# Patient Record
Sex: Male | Born: 1935 | Race: Black or African American | Hispanic: No | Marital: Married | Smoking: Former smoker
Health system: Southern US, Community
[De-identification: ages and names within clinical notes are randomized; demographics above are authoritative.]

## PROBLEM LIST (undated history)

## (undated) DIAGNOSIS — J45909 Unspecified asthma, uncomplicated: Secondary | ICD-10-CM

## (undated) DIAGNOSIS — Z86718 Personal history of other venous thrombosis and embolism: Secondary | ICD-10-CM

## (undated) DIAGNOSIS — H9190 Unspecified hearing loss, unspecified ear: Secondary | ICD-10-CM

## (undated) DIAGNOSIS — H269 Unspecified cataract: Secondary | ICD-10-CM

## (undated) DIAGNOSIS — F0390 Unspecified dementia without behavioral disturbance: Secondary | ICD-10-CM

## (undated) DIAGNOSIS — E119 Type 2 diabetes mellitus without complications: Secondary | ICD-10-CM

## (undated) DIAGNOSIS — I10 Essential (primary) hypertension: Secondary | ICD-10-CM

## (undated) HISTORY — PX: KNEE SURGERY: SHX244

## (undated) HISTORY — DX: Personal history of other venous thrombosis and embolism: Z86.718

---

## 2013-06-12 DIAGNOSIS — E119 Type 2 diabetes mellitus without complications: Secondary | ICD-10-CM | POA: Diagnosis not present

## 2013-06-12 DIAGNOSIS — E785 Hyperlipidemia, unspecified: Secondary | ICD-10-CM | POA: Diagnosis not present

## 2013-06-12 DIAGNOSIS — E7800 Pure hypercholesterolemia, unspecified: Secondary | ICD-10-CM | POA: Diagnosis not present

## 2013-06-12 DIAGNOSIS — R5381 Other malaise: Secondary | ICD-10-CM | POA: Diagnosis not present

## 2013-06-12 DIAGNOSIS — N312 Flaccid neuropathic bladder, not elsewhere classified: Secondary | ICD-10-CM | POA: Diagnosis not present

## 2013-06-12 DIAGNOSIS — I10 Essential (primary) hypertension: Secondary | ICD-10-CM | POA: Diagnosis not present

## 2013-06-12 DIAGNOSIS — R109 Unspecified abdominal pain: Secondary | ICD-10-CM | POA: Diagnosis not present

## 2013-06-19 DIAGNOSIS — E119 Type 2 diabetes mellitus without complications: Secondary | ICD-10-CM | POA: Diagnosis not present

## 2013-06-19 DIAGNOSIS — F0390 Unspecified dementia without behavioral disturbance: Secondary | ICD-10-CM | POA: Diagnosis not present

## 2013-06-19 DIAGNOSIS — E7800 Pure hypercholesterolemia, unspecified: Secondary | ICD-10-CM | POA: Diagnosis not present

## 2013-06-19 DIAGNOSIS — R0602 Shortness of breath: Secondary | ICD-10-CM | POA: Diagnosis not present

## 2013-06-19 DIAGNOSIS — I10 Essential (primary) hypertension: Secondary | ICD-10-CM | POA: Diagnosis not present

## 2014-08-28 DIAGNOSIS — E559 Vitamin D deficiency, unspecified: Secondary | ICD-10-CM | POA: Diagnosis not present

## 2014-08-28 DIAGNOSIS — R5383 Other fatigue: Secondary | ICD-10-CM | POA: Diagnosis not present

## 2014-08-28 DIAGNOSIS — F0390 Unspecified dementia without behavioral disturbance: Secondary | ICD-10-CM | POA: Diagnosis not present

## 2014-08-28 DIAGNOSIS — N320 Bladder-neck obstruction: Secondary | ICD-10-CM | POA: Diagnosis not present

## 2014-08-28 DIAGNOSIS — I10 Essential (primary) hypertension: Secondary | ICD-10-CM | POA: Diagnosis not present

## 2014-08-28 DIAGNOSIS — E119 Type 2 diabetes mellitus without complications: Secondary | ICD-10-CM | POA: Diagnosis not present

## 2014-08-28 DIAGNOSIS — R0602 Shortness of breath: Secondary | ICD-10-CM | POA: Diagnosis not present

## 2014-08-28 DIAGNOSIS — E780 Pure hypercholesterolemia: Secondary | ICD-10-CM | POA: Diagnosis not present

## 2014-10-23 DIAGNOSIS — H2589 Other age-related cataract: Secondary | ICD-10-CM | POA: Diagnosis not present

## 2014-10-30 ENCOUNTER — Emergency Department
Admission: EM | Admit: 2014-10-30 | Discharge: 2014-10-30 | Disposition: A | Discharge status: Home / Self Care | Payer: Medicare Other | Attending: Emergency Medicine | Admitting: Emergency Medicine

## 2014-10-30 ENCOUNTER — Inpatient Hospital Stay: Admission: RE | Admit: 2014-10-30 | Payer: Self-pay | Source: Ambulatory Visit

## 2014-10-30 ENCOUNTER — Encounter: Payer: Self-pay | Admitting: Medical Oncology

## 2014-10-30 DIAGNOSIS — Z87891 Personal history of nicotine dependence: Secondary | ICD-10-CM | POA: Insufficient documentation

## 2014-10-30 DIAGNOSIS — I159 Secondary hypertension, unspecified: Secondary | ICD-10-CM | POA: Diagnosis not present

## 2014-10-30 DIAGNOSIS — I10 Essential (primary) hypertension: Secondary | ICD-10-CM | POA: Diagnosis present

## 2014-10-30 DIAGNOSIS — E119 Type 2 diabetes mellitus without complications: Secondary | ICD-10-CM | POA: Insufficient documentation

## 2014-10-30 DIAGNOSIS — H2589 Other age-related cataract: Secondary | ICD-10-CM | POA: Diagnosis not present

## 2014-10-30 HISTORY — DX: Unspecified cataract: H26.9

## 2014-10-30 HISTORY — DX: Unspecified dementia, unspecified severity, without behavioral disturbance, psychotic disturbance, mood disturbance, and anxiety: F03.90

## 2014-10-30 HISTORY — DX: Type 2 diabetes mellitus without complications: E11.9

## 2014-10-30 LAB — BASIC METABOLIC PANEL
ANION GAP: 6 (ref 5–15)
BUN: 11 mg/dL (ref 6–20)
CHLORIDE: 105 mmol/L (ref 101–111)
CO2: 27 mmol/L (ref 22–32)
Calcium: 8.7 mg/dL — ABNORMAL LOW (ref 8.9–10.3)
Creatinine, Ser: 1.11 mg/dL (ref 0.61–1.24)
GFR calc non Af Amer: >60 mL/min (ref >60)
Glucose, Bld: 206 mg/dL — ABNORMAL HIGH (ref 65–99)
Potassium: 5.1 mmol/L (ref 3.5–5.1)
Sodium: 138 mmol/L (ref 135–145)

## 2014-10-30 LAB — CBC
HCT: 44.5 % (ref 40.0–52.0)
Hemoglobin: 14.4 g/dL (ref 13.0–18.0)
MCH: 27.5 pg (ref 26.0–34.0)
MCHC: 32.3 g/dL (ref 32.0–36.0)
MCV: 85.1 fL (ref 80.0–100.0)
PLATELETS: 175 10*3/uL (ref 150–440)
RBC: 5.23 MIL/uL (ref 4.40–5.90)
RDW: 14.2 % (ref 11.5–14.5)
WBC: 4.4 10*3/uL (ref 3.8–10.6)

## 2014-10-30 LAB — TROPONIN I: Troponin I: <0.03 ng/mL (ref <0.031)

## 2014-10-30 MED ORDER — HYDROCHLOROTHIAZIDE 12.5 MG PO TABS: 12.5000 mg | ORAL_TABLET | Freq: Every day | ORAL | Status: DC

## 2014-10-30 NOTE — ED Notes (Signed)
Pt ambulatory to triage with reports that they were at [facility] for pre-op for cataract surgery, pt was noted to be hypertensive and told to come to er. No hx of HTN.

## 2014-10-30 NOTE — Discharge Instructions (Signed)
Please seek medical attention for any high fevers, chest pain, shortness of breath, change in behavior, persistent vomiting, bloody stool or any other new or concerning symptoms. ° °Hypertension °Hypertension, commonly called high blood pressure, is when the force of blood pumping through your arteries is too strong. Your arteries are the blood vessels that carry blood from your heart throughout your body. A blood pressure reading consists of a higher number over a lower number, such as 110/72. The higher number (systolic) is the pressure inside your arteries when your heart pumps. The lower number (diastolic) is the pressure inside your arteries when your heart relaxes. Ideally you want your blood pressure below 120/80. °Hypertension forces your heart to work harder to pump blood. Your arteries may become narrow or stiff. Having hypertension puts you at risk for heart disease, stroke, and other problems.  °RISK FACTORS °Some risk factors for high blood pressure are controllable. Others are not.  °Risk factors you cannot control include:  °· Race. You may be at higher risk if you are African American. °· Age. Risk increases with age. °· Gender. Men are at higher risk than women before age 45 years. After age 65, women are at higher risk than men. °Risk factors you can control include: °· Not getting enough exercise or physical activity. °· Being overweight. °· Getting too much fat, sugar, calories, or salt in your diet. °· Drinking too much alcohol. °SIGNS AND SYMPTOMS °Hypertension does not usually cause signs or symptoms. Extremely high blood pressure (hypertensive crisis) may cause headache, anxiety, shortness of breath, and nosebleed. °DIAGNOSIS  °To check if you have hypertension, your health care provider will measure your blood pressure while you are seated, with your arm held at the level of your heart. It should be measured at least twice using the same arm. Certain conditions can cause a difference in  blood pressure between your right and left arms. A blood pressure reading that is higher than normal on one occasion does not mean that you need treatment. If one blood pressure reading is high, ask your health care provider about having it checked again. °TREATMENT  °Treating high blood pressure includes making lifestyle changes and possibly taking medicine. Living a healthy lifestyle can help lower high blood pressure. You may need to change some of your habits. °Lifestyle changes may include: °· Following the DASH diet. This diet is high in fruits, vegetables, and whole grains. It is low in salt, red meat, and added sugars. °· Getting at least 2½ hours of brisk physical activity every week. °· Losing weight if necessary. °· Not smoking. °· Limiting alcoholic beverages. °· Learning ways to reduce stress. ° If lifestyle changes are not enough to get your blood pressure under control, your health care provider may prescribe medicine. You may need to take more than one. Work closely with your health care provider to understand the risks and benefits. °HOME CARE INSTRUCTIONS °· Have your blood pressure rechecked as directed by your health care provider.   °· Take medicines only as directed by your health care provider. Follow the directions carefully. Blood pressure medicines must be taken as prescribed. The medicine does not work as well when you skip doses. Skipping doses also puts you at risk for problems.   °· Do not smoke.   °· Monitor your blood pressure at home as directed by your health care provider.  °SEEK MEDICAL CARE IF:  °· You think you are having a reaction to medicines taken. °· You have recurrent headaches or feel   dizzy. °· You have swelling in your ankles. °· You have trouble with your vision. °SEEK IMMEDIATE MEDICAL CARE IF: °· You develop a severe headache or confusion. °· You have unusual weakness, numbness, or feel faint. °· You have severe chest or abdominal pain. °· You vomit repeatedly. °· You  have trouble breathing. °MAKE SURE YOU:  °· Understand these instructions. °· Will watch your condition. °· Will get help right away if you are not doing well or get worse. °Document Released: 04/24/2005 Document Revised: 09/08/2013 Document Reviewed: 02/14/2013 °ExitCare® Patient Information ©2015 [employer]. This information is not intended to replace advice given to you by your health care provider. Make sure you discuss any questions you have with your health care provider. ° °

## 2014-10-30 NOTE — ED Notes (Signed)
Per Myrna in lab the patient's troponin is <0.03

## 2014-10-30 NOTE — ED Provider Notes (Signed)
[facility] Emergency Department Provider Note  ____________________________________________  Time seen: 1300  I have reviewed the triage vital signs and the nursing notes.   HISTORY  Chief Complaint Hypertension   History limited by: Not Limited   HPI Tyler Jimenez is a 81 y.o. male who presents to the emergency department because of concerns for high blood pressure. Patient was at the ophthalmologist office today where he was getting preop for cataract surgery when they noted him to have extremely elevated blood pressure. They advised that the patient be evaluated in emergency department. The patient denies any recent symptoms. Denies any chest pain, shortness breath, headache. Patient does state he has a history of high blood pressure and has been on blood pressure medication in the past. He states he has not been taking it for a long time.     Past Medical History  Diagnosis Date  . Diabetes mellitus without complication   . Cataract   . Dementia     There are no active problems to display for this patient.   History reviewed. No pertinent past surgical history.  No current outpatient prescriptions on file.  Allergies Review of patient's allergies indicates no known allergies.  No family history on file.  Social History History  Substance Use Topics  . Smoking status: Former Games developer  . Smokeless tobacco: Not on file  . Alcohol Use: Yes     Comment: occ    Review of Systems  Constitutional: Negative for fever. Cardiovascular: Negative for chest pain. Respiratory: Negative for shortness of breath. Gastrointestinal: Negative for abdominal pain, vomiting and diarrhea. Genitourinary: Negative for dysuria. Musculoskeletal: Negative for back pain. Skin: Negative for rash. Neurological: Negative for headaches, focal weakness or numbness.  10-point ROS otherwise negative.  ____________________________________________   PHYSICAL  EXAM:  VITAL SIGNS: ED Triage Vitals  Enc Vitals Group     BP 10/30/14 1233 229/114 mmHg     Pulse Rate 10/30/14 1233 62     Resp 10/30/14 1233 18     Temp 10/30/14 1233 98.3 F (36.8 C)     Temp Source 10/30/14 1233 Oral     SpO2 10/30/14 1233 97 %     Weight 10/30/14 1233 150 lb (68.04 kg)     Height 10/30/14 1233 5\' 2"  (1.575 m)     Head Cir --      Peak Flow --      Pain Score --      Pain Loc --      Pain Edu? --      Excl. in GC? --      Constitutional: Alert and oriented. Well appearing and in no distress. Eyes: Conjunctivae are normal. PERRL. Normal extraocular movements. ENT   Head: Normocephalic and atraumatic.   Nose: No congestion/rhinnorhea.   Mouth/Throat: Mucous membranes are moist.   Neck: No stridor. Hematological/Lymphatic/Immunilogical: No cervical lymphadenopathy. Cardiovascular: Normal rate, regular rhythm.  No murmurs, rubs, or gallops. Respiratory: Normal respiratory effort without tachypnea nor retractions. Breath sounds are clear and equal bilaterally. No wheezes/rales/rhonchi. Gastrointestinal: Soft and nontender. No distention. There is no CVA tenderness. Genitourinary: Deferred Musculoskeletal: Normal range of motion in all extremities. No joint effusions.  No lower extremity tenderness nor edema. Neurologic:  Normal speech and language. No gross focal neurologic deficits are appreciated. Speech is normal.  Skin:  Skin is warm, dry and intact. No rash noted. Psychiatric: Mood and affect are normal. Speech and behavior are normal. Patient exhibits appropriate insight and judgment.  ____________________________________________    LABS (pertinent positives/negatives)  Labs Reviewed  BASIC METABOLIC PANEL - Abnormal; Notable for the following:    Glucose, Bld 206 (*)    Calcium 8.7 (*)    All other components within normal limits  CBC  TROPONIN I     ____________________________________________   EKG  I, Phineas Semen,  attending physician, personally viewed and interpreted this EKG  EKG Time: 1242 Rate: 61 Rhythm: Normal sinus rhythm Axis: Left axis deviation Intervals: QTC 390 QRS: Narrow, Q waves in 3, aVF ST changes: No ST elevation    ____________________________________________    RADIOLOGY  None  ____________________________________________   PROCEDURES  Procedure(s) performed: None  Critical Care performed: No  ____________________________________________   INITIAL IMPRESSION / ASSESSMENT AND PLAN / ED COURSE  Pertinent labs & imaging results that were available during my care of the patient were reviewed by me and considered in my medical decision making (see chart for details).  Patient here with asymptomatic hypertension. Will check blood work. If blood work is okay will start on antihypertensive.  ____________________________________________   FINAL CLINICAL IMPRESSION(S) / ED DIAGNOSES  Final diagnoses:  Secondary hypertension, unspecified     Phineas Semen, MD 11/01/14 1502

## 2014-11-05 DIAGNOSIS — E1165 Type 2 diabetes mellitus with hyperglycemia: Secondary | ICD-10-CM | POA: Diagnosis not present

## 2014-11-05 DIAGNOSIS — E1122 Type 2 diabetes mellitus with diabetic chronic kidney disease: Secondary | ICD-10-CM | POA: Diagnosis not present

## 2014-11-05 DIAGNOSIS — I10 Essential (primary) hypertension: Secondary | ICD-10-CM | POA: Diagnosis not present

## 2014-11-05 DIAGNOSIS — G309 Alzheimer's disease, unspecified: Secondary | ICD-10-CM | POA: Diagnosis not present

## 2014-11-05 DIAGNOSIS — R011 Cardiac murmur, unspecified: Secondary | ICD-10-CM | POA: Diagnosis not present

## 2014-11-05 DIAGNOSIS — E785 Hyperlipidemia, unspecified: Secondary | ICD-10-CM | POA: Diagnosis not present

## 2014-11-05 DIAGNOSIS — Z125 Encounter for screening for malignant neoplasm of prostate: Secondary | ICD-10-CM | POA: Diagnosis not present

## 2014-11-10 ENCOUNTER — Ambulatory Visit: Admission: RE | Admit: 2014-11-10 | Payer: Medicare Other | Source: Ambulatory Visit | Admitting: Ophthalmology

## 2014-11-10 SURGERY — PHACOEMULSIFICATION, CATARACT, WITH IOL INSERTION
Anesthesia: Choice | Laterality: Right

## 2014-11-12 DIAGNOSIS — R011 Cardiac murmur, unspecified: Secondary | ICD-10-CM | POA: Diagnosis not present

## 2014-11-16 DIAGNOSIS — G309 Alzheimer's disease, unspecified: Secondary | ICD-10-CM | POA: Diagnosis not present

## 2014-11-16 DIAGNOSIS — I10 Essential (primary) hypertension: Secondary | ICD-10-CM | POA: Diagnosis not present

## 2014-11-16 DIAGNOSIS — E1165 Type 2 diabetes mellitus with hyperglycemia: Secondary | ICD-10-CM | POA: Diagnosis not present

## 2014-12-04 ENCOUNTER — Encounter: Payer: Self-pay | Admitting: Urology

## 2014-12-04 ENCOUNTER — Ambulatory Visit: Payer: Self-pay

## 2014-12-08 DIAGNOSIS — G309 Alzheimer's disease, unspecified: Secondary | ICD-10-CM | POA: Diagnosis not present

## 2014-12-08 DIAGNOSIS — I10 Essential (primary) hypertension: Secondary | ICD-10-CM | POA: Diagnosis not present

## 2014-12-08 DIAGNOSIS — E1165 Type 2 diabetes mellitus with hyperglycemia: Secondary | ICD-10-CM | POA: Diagnosis not present

## 2015-01-04 ENCOUNTER — Encounter: Payer: Self-pay | Admitting: *Deleted

## 2015-01-04 DIAGNOSIS — Z87891 Personal history of nicotine dependence: Secondary | ICD-10-CM | POA: Diagnosis not present

## 2015-01-04 DIAGNOSIS — F0390 Unspecified dementia without behavioral disturbance: Secondary | ICD-10-CM | POA: Diagnosis not present

## 2015-01-04 DIAGNOSIS — J45909 Unspecified asthma, uncomplicated: Secondary | ICD-10-CM | POA: Diagnosis not present

## 2015-01-04 DIAGNOSIS — H2511 Age-related nuclear cataract, right eye: Secondary | ICD-10-CM | POA: Diagnosis not present

## 2015-01-04 DIAGNOSIS — Z79899 Other long term (current) drug therapy: Secondary | ICD-10-CM | POA: Diagnosis not present

## 2015-01-04 DIAGNOSIS — I10 Essential (primary) hypertension: Secondary | ICD-10-CM | POA: Diagnosis not present

## 2015-01-04 DIAGNOSIS — E119 Type 2 diabetes mellitus without complications: Secondary | ICD-10-CM | POA: Diagnosis not present

## 2015-01-04 NOTE — OR Nursing (Signed)
Cleared by Sander [facility]on medical

## 2015-01-05 ENCOUNTER — Ambulatory Visit: Payer: Medicare Other | Admitting: Anesthesiology

## 2015-01-05 ENCOUNTER — Encounter
Admission: RE | Disposition: A | Discharge status: Home / Self Care | Payer: Self-pay | Source: Ambulatory Visit | Attending: Ophthalmology

## 2015-01-05 ENCOUNTER — Ambulatory Visit
Admission: RE | Admit: 2015-01-05 | Discharge: 2015-01-05 | Disposition: A | Discharge status: Home / Self Care | Payer: Medicare Other | Source: Ambulatory Visit | Attending: Ophthalmology | Admitting: Ophthalmology

## 2015-01-05 ENCOUNTER — Encounter: Payer: Self-pay | Admitting: *Deleted

## 2015-01-05 DIAGNOSIS — Z87891 Personal history of nicotine dependence: Secondary | ICD-10-CM | POA: Diagnosis not present

## 2015-01-05 DIAGNOSIS — F0390 Unspecified dementia without behavioral disturbance: Secondary | ICD-10-CM | POA: Insufficient documentation

## 2015-01-05 DIAGNOSIS — H2589 Other age-related cataract: Secondary | ICD-10-CM | POA: Diagnosis not present

## 2015-01-05 DIAGNOSIS — E119 Type 2 diabetes mellitus without complications: Secondary | ICD-10-CM | POA: Insufficient documentation

## 2015-01-05 DIAGNOSIS — H2511 Age-related nuclear cataract, right eye: Secondary | ICD-10-CM | POA: Diagnosis not present

## 2015-01-05 DIAGNOSIS — Z79899 Other long term (current) drug therapy: Secondary | ICD-10-CM | POA: Insufficient documentation

## 2015-01-05 DIAGNOSIS — J45909 Unspecified asthma, uncomplicated: Secondary | ICD-10-CM | POA: Diagnosis not present

## 2015-01-05 DIAGNOSIS — I10 Essential (primary) hypertension: Secondary | ICD-10-CM | POA: Insufficient documentation

## 2015-01-05 HISTORY — DX: Essential (primary) hypertension: I10

## 2015-01-05 HISTORY — DX: Unspecified asthma, uncomplicated: J45.909

## 2015-01-05 HISTORY — PX: CATARACT EXTRACTION W/PHACO: SHX586

## 2015-01-05 HISTORY — DX: Unspecified hearing loss, unspecified ear: H91.90

## 2015-01-05 LAB — GLUCOSE, CAPILLARY: GLUCOSE-CAPILLARY: 109 mg/dL — AB (ref 65–99)

## 2015-01-05 SURGERY — PHACOEMULSIFICATION, CATARACT, WITH IOL INSERTION
Anesthesia: Monitor Anesthesia Care | Site: Eye | Laterality: Right | Wound class: Clean

## 2015-01-05 MED ORDER — MOXIFLOXACIN HCL 0.5 % OP SOLN: 1.0000 [drp] | OPHTHALMIC | Status: DC | PRN

## 2015-01-05 MED ORDER — POVIDONE-IODINE 5 % OP SOLN
1.0000 "application " | OPHTHALMIC | Status: AC | PRN
  Administered 2015-01-05: 1 via OPHTHALMIC

## 2015-01-05 MED ORDER — MOXIFLOXACIN HCL 0.5 % OP SOLN
OPHTHALMIC | Status: DC | PRN
  Administered 2015-01-05: 1 [drp] via OPHTHALMIC

## 2015-01-05 MED ORDER — CEFUROXIME OPHTHALMIC INJECTION 1 MG/0.1 ML
INJECTION | OPHTHALMIC | Status: AC
  Filled 2015-01-05: qty 0.1

## 2015-01-05 MED ORDER — TETRACAINE HCL 0.5 % OP SOLN
1.0000 [drp] | OPHTHALMIC | Status: AC | PRN
  Administered 2015-01-05: 1 [drp] via OPHTHALMIC

## 2015-01-05 MED ORDER — MOXIFLOXACIN HCL 0.5 % OP SOLN
OPHTHALMIC | Status: AC
  Filled 2015-01-05: qty 3

## 2015-01-05 MED ORDER — NA CHONDROIT SULF-NA HYALURON 40-17 MG/ML IO SOLN
INTRAOCULAR | Status: AC
  Filled 2015-01-05: qty 1

## 2015-01-05 MED ORDER — SODIUM CHLORIDE 0.9 % IV SOLN
INTRAVENOUS | Status: DC
  Administered 2015-01-05: 50 mL/h via INTRAVENOUS

## 2015-01-05 MED ORDER — EPINEPHRINE HCL 1 MG/ML IJ SOLN
INTRAOCULAR | Status: DC | PRN
  Administered 2015-01-05: 200 mL via OPHTHALMIC

## 2015-01-05 MED ORDER — FENTANYL CITRATE (PF) 100 MCG/2ML IJ SOLN
INTRAMUSCULAR | Status: DC | PRN
  Administered 2015-01-05: 50 ug via INTRAVENOUS

## 2015-01-05 MED ORDER — ARMC OPHTHALMIC DILATING GEL
OPHTHALMIC | Status: AC
  Administered 2015-01-05: 1 via OPHTHALMIC
  Filled 2015-01-05: qty 0.25

## 2015-01-05 MED ORDER — EPINEPHRINE HCL 1 MG/ML IJ SOLN
INTRAMUSCULAR | Status: AC
  Filled 2015-01-05: qty 1

## 2015-01-05 MED ORDER — TETRACAINE HCL 0.5 % OP SOLN
OPHTHALMIC | Status: AC
  Administered 2015-01-05: 1 [drp] via OPHTHALMIC
  Filled 2015-01-05: qty 2

## 2015-01-05 MED ORDER — CEFUROXIME OPHTHALMIC INJECTION 1 MG/0.1 ML
INJECTION | OPHTHALMIC | Status: DC | PRN
  Administered 2015-01-05: 0.1 mL via INTRACAMERAL

## 2015-01-05 MED ORDER — NA CHONDROIT SULF-NA HYALURON 40-17 MG/ML IO SOLN
INTRAOCULAR | Status: DC | PRN
  Administered 2015-01-05: 1 mL via INTRAOCULAR

## 2015-01-05 MED ORDER — ARMC OPHTHALMIC DILATING GEL
1.0000 "application " | OPHTHALMIC | Status: DC | PRN
  Administered 2015-01-05: 1 via OPHTHALMIC

## 2015-01-05 MED ORDER — POVIDONE-IODINE 5 % OP SOLN
OPHTHALMIC | Status: AC
  Administered 2015-01-05: 1 via OPHTHALMIC
  Filled 2015-01-05: qty 30

## 2015-01-05 SURGICAL SUPPLY — 23 items
CANNULA ANT/CHMB 27GA (MISCELLANEOUS) ×3 IMPLANT
CUP MEDICINE 2OZ PLAST GRAD ST (MISCELLANEOUS) ×3 IMPLANT
GLOVE BIO SURGEON STRL SZ8 (GLOVE) ×3 IMPLANT
GLOVE BIOGEL M 6.5 STRL (GLOVE) ×3 IMPLANT
GLOVE SURG LX 8.0 MICRO (GLOVE) ×2
GLOVE SURG LX STRL 8.0 MICRO (GLOVE) ×1 IMPLANT
GOWN STRL REUS W/ TWL LRG LVL3 (GOWN DISPOSABLE) ×2 IMPLANT
GOWN STRL REUS W/TWL LRG LVL3 (GOWN DISPOSABLE) ×4
LENS IOL TECNIS 21 (Intraocular Lens) ×1 IMPLANT
LENS IOL TECNIS 21.0 (Intraocular Lens) ×2 IMPLANT
LENS IOL TECNIS MONO 1P 21.0 (Intraocular Lens) ×1 IMPLANT
PACK CATARACT (MISCELLANEOUS) ×3 IMPLANT
PACK CATARACT BRASINGTON LX (MISCELLANEOUS) ×3 IMPLANT
PACK EYE AFTER SURG (MISCELLANEOUS) ×3 IMPLANT
SOL BSS BAG (MISCELLANEOUS) ×3
SOL PREP PVP 2OZ (MISCELLANEOUS) ×3
SOLUTION BSS BAG (MISCELLANEOUS) ×1 IMPLANT
SOLUTION PREP PVP 2OZ (MISCELLANEOUS) ×1 IMPLANT
SYR 3ML LL SCALE MARK (SYRINGE) ×3 IMPLANT
SYR 5ML LL (SYRINGE) ×3 IMPLANT
SYR TB 1ML 27GX1/2 LL (SYRINGE) ×3 IMPLANT
WATER STERILE IRR 1000ML POUR (IV SOLUTION) ×3 IMPLANT
WIPE NON LINTING 3.25X3.25 (MISCELLANEOUS) ×3 IMPLANT

## 2015-01-05 NOTE — Anesthesia Postprocedure Evaluation (Signed)
  Anesthesia Post-op Note  Patient: Tyler Jimenez  Procedure(s) Performed: Procedure(s) with comments: CATARACT EXTRACTION PHACO AND INTRAOCULAR LENS PLACEMENT (IOC) (Right) - [place]: 02:33.9 AP%: 29.9 CDE: 46.04 Lot# [id number]  Anesthesia type:MAC  Patient location: short stay  Post pain: Pain level controlled  Post assessment: Post-op Vital signs reviewed, Patient's Cardiovascular Status Stable, Respiratory Function Stable, Patent Airway and No signs of Nausea or vomiting  Post vital signs: Reviewed and stable  Last Vitals:  Filed Vitals:   01/05/15 0644  BP: 171/76  Pulse: 67  Temp: 36.7 C  Resp: 16    Level of consciousness: awake, alert  and patient cooperative  Complications: No apparent anesthesia complications

## 2015-01-05 NOTE — Transfer of Care (Signed)
Immediate Anesthesia Transfer of Care Note  Patient: Tyler Jimenez  Procedure(s) Performed: Procedure(s) with comments: CATARACT EXTRACTION PHACO AND INTRAOCULAR LENS PLACEMENT (IOC) (Right) - [place]: 02:33.9 AP%: 29.9 CDE: 46.04 Lot# [id number]  Patient Location: PACU and Short Stay  Anesthesia Type:MAC  Level of Consciousness: awake, alert  and oriented  Airway & Oxygen Therapy: Patient Spontanous Breathing and Patient connected to nasal cannula oxygen  Post-op Assessment: Report given to RN and Post -op Vital signs reviewed and stable  Post vital signs: Reviewed and stable  Last Vitals: 100% 96.6 70hr 168/91 18r Filed Vitals:   01/05/15 0644  BP: 171/76  Pulse: 67  Temp: 36.7 C  Resp: 16    Complications: No apparent anesthesia complications

## 2015-01-05 NOTE — H&P (Signed)
  All labs reviewed. Abnormal studies sent to patients PCP when indicated.  Previous H&P reviewed, patient examined, there are NO CHANGES.  Tyler Jimenez LOUIS8/30/20167:47 AM

## 2015-01-05 NOTE — Anesthesia Preprocedure Evaluation (Signed)
Anesthesia Evaluation  Patient identified by MRN, date of birth, ID band Patient awake    Reviewed: Allergy & Precautions, NPO status , Patient's Chart, lab work & pertinent test results  Airway Mallampati: II  TM Distance: >3 FB Neck ROM: Full    Dental  (+) Poor Dentition   Pulmonary asthma , former smoker,    Pulmonary exam normal       Cardiovascular hypertension, Normal cardiovascular exam    Neuro/Psych PSYCHIATRIC DISORDERS dementia   GI/Hepatic negative GI ROS, Neg liver ROS,   Endo/Other  diabetes, Well Controlled, Type 2  Renal/GU negative Renal ROS  negative genitourinary   Musculoskeletal negative musculoskeletal ROS (+)   Abdominal Normal abdominal exam  (+)   Peds negative pediatric ROS (+)  Hematology negative hematology ROS (+)   Anesthesia Other Findings   Reproductive/Obstetrics                             Anesthesia Physical Anesthesia Plan  ASA: III  Anesthesia Plan: MAC   Post-op Pain Management:    Induction: Intravenous  Airway Management Planned: Nasal Cannula  Additional Equipment:   Intra-op Plan:   Post-operative Plan:   Informed Consent: I have reviewed the patients History and Physical, chart, labs and discussed the procedure including the risks, benefits and alternatives for the proposed anesthesia with the patient or authorized representative who has indicated his/her understanding and acceptance.   Dental advisory given  Plan Discussed with: CRNA and Surgeon  Anesthesia Plan Comments:         Anesthesia Quick Evaluation

## 2015-01-05 NOTE — Discharge Instructions (Signed)
AMBULATORY SURGERY  DISCHARGE INSTRUCTIONS   1) The drugs that you were given will stay in your system until tomorrow so for the next 24 hours you should not:  A) Drive an automobile B) Make any legal decisions C) Drink any alcoholic beverage   2) You may resume regular meals tomorrow.  Today it is better to start with liquids and gradually work up to solid foods.  You may eat anything you prefer, but it is better to start with liquids, then soup and crackers, and gradually work up to solid foods.   3) Please notify your doctor immediately if you have any unusual bleeding, trouble breathing, redness and pain at the surgery site, drainage, fever, or pain not relieved by medication.    4) Additional Instructions:     Eye Surgery Discharge Instructions  Expect mild scratchy sensation or mild soreness. DO NOT RUB YOUR EYE!  The day of surgery:  Minimal physical activity, but bed rest is not required  No reading, computer work, or close hand work  No bending, lifting, or straining.  May watch TV  For 24 hours:  No driving, legal decisions, or alcoholic beverages  Safety precautions  Eat anything you prefer: It is better to start with liquids, then soup then solid foods.  _____ Eye patch should be worn until postoperative exam tomorrow.  ____ Solar shield eyeglasses should be worn for comfort in the sunlight/patch while sleeping  Resume all regular medications including aspirin or Coumadin if these were discontinued prior to surgery. You may shower, bathe, shave, or wash your hair. Tylenol may be taken for mild discomfort.  Call your doctor if you experience significant pain, nausea, or vomiting, fever > 101 or other signs of infection. [phone] or [phone] Specific instructions:  Follow-up Information    Please follow up.   Why:  August 31 at 9:40am       Please contact your physician with any problems or Same Day Surgery at [phone], Monday  through Friday 6 am to 4 pm, or [place] [facility] number at [phone].

## 2015-01-05 NOTE — Op Note (Signed)
PREOPERATIVE DIAGNOSIS:  Nuclear sclerotic cataract of the right eye.   POSTOPERATIVE DIAGNOSIS: right nuclear sclerotic cataract   OPERATIVE PROCEDURE:  Procedure(s): CATARACT EXTRACTION PHACO AND INTRAOCULAR LENS PLACEMENT (IOC)   SURGEON:  Galen Manila, MD.   ANESTHESIA:  Anesthesiologist: Yves Dill, MD CRNA: Chong Sicilian, CRNA  1.      Managed anesthesia care. 2.      Topical tetracaine drops followed by 2% Xylocaine jelly applied in the preoperative holding area.   COMPLICATIONS:  None.   TECHNIQUE:   Stop and chop   DESCRIPTION OF PROCEDURE:  The patient was examined and consented in the preoperative holding area where the aforementioned topical anesthesia was applied to the right eye and then brought back to the Operating Room where the right eye was prepped and draped in the usual sterile ophthalmic fashion and a lid speculum was placed. A paracentesis was created with the side port blade and the anterior chamber was filled with viscoelastic. A near clear corneal incision was performed with the steel keratome. A continuous curvilinear capsulorrhexis was performed with a cystotome followed by the capsulorrhexis forceps. Hydrodissection and hydrodelineation were carried out with BSS on a blunt cannula. The lens was removed in a stop and chop  technique and the remaining cortical material was removed with the irrigation-aspiration handpiece. The capsular bag was inflated with viscoelastic and the Technis ZCB00  lens was placed in the capsular bag without complication. The remaining viscoelastic was removed from the eye with the irrigation-aspiration handpiece. The wounds were hydrated. The anterior chamber was flushed with Miostat and the eye was inflated to physiologic pressure. 0.1 mL of cefuroxime concentration 10 mg/mL was placed in the anterior chamber. The wounds were found to be water tight. The eye was dressed with Vigamox. The patient was given protective glasses to wear  throughout the day and a shield with which to sleep tonight. The patient was also given drops with which to begin a drop regimen today and will follow-up with me in one day.  Implant Name Type Inv. Item Serial No. Manufacturer Lot No. LRB No. Used  LENS IMPL INTRAOC [id number] 21.0 - [id number] Intraocular Lens LENS IMPL INTRAOC ZCB00 21.0 [id number]   Right 1   Procedure(s) with comments: CATARACT EXTRACTION PHACO AND INTRAOCULAR LENS PLACEMENT (IOC) (Right) - [place]: 02:33.9 AP%: 29.9 CDE: 46.04 Lot# [id number]  Electronically signed: Akiba Melfi LOUIS 01/05/2015 8:20 AM

## 2015-01-13 DIAGNOSIS — H2589 Other age-related cataract: Secondary | ICD-10-CM | POA: Diagnosis not present

## 2015-01-14 ENCOUNTER — Encounter: Payer: Self-pay | Admitting: *Deleted

## 2015-01-19 ENCOUNTER — Encounter
Admission: RE | Disposition: A | Discharge status: Home / Self Care | Payer: Self-pay | Source: Ambulatory Visit | Attending: Ophthalmology

## 2015-01-19 ENCOUNTER — Ambulatory Visit: Payer: Medicare Other | Admitting: Anesthesiology

## 2015-01-19 ENCOUNTER — Ambulatory Visit
Admission: RE | Admit: 2015-01-19 | Discharge: 2015-01-19 | Disposition: A | Discharge status: Home / Self Care | Payer: Medicare Other | Source: Ambulatory Visit | Attending: Ophthalmology | Admitting: Ophthalmology

## 2015-01-19 ENCOUNTER — Encounter: Payer: Self-pay | Admitting: *Deleted

## 2015-01-19 DIAGNOSIS — J45909 Unspecified asthma, uncomplicated: Secondary | ICD-10-CM | POA: Diagnosis not present

## 2015-01-19 DIAGNOSIS — Z87891 Personal history of nicotine dependence: Secondary | ICD-10-CM | POA: Insufficient documentation

## 2015-01-19 DIAGNOSIS — E119 Type 2 diabetes mellitus without complications: Secondary | ICD-10-CM | POA: Diagnosis not present

## 2015-01-19 DIAGNOSIS — F0390 Unspecified dementia without behavioral disturbance: Secondary | ICD-10-CM | POA: Insufficient documentation

## 2015-01-19 DIAGNOSIS — H9190 Unspecified hearing loss, unspecified ear: Secondary | ICD-10-CM | POA: Insufficient documentation

## 2015-01-19 DIAGNOSIS — H2512 Age-related nuclear cataract, left eye: Secondary | ICD-10-CM | POA: Diagnosis not present

## 2015-01-19 DIAGNOSIS — I10 Essential (primary) hypertension: Secondary | ICD-10-CM | POA: Insufficient documentation

## 2015-01-19 DIAGNOSIS — H2589 Other age-related cataract: Secondary | ICD-10-CM | POA: Diagnosis not present

## 2015-01-19 HISTORY — PX: CATARACT EXTRACTION W/PHACO: SHX586

## 2015-01-19 LAB — GLUCOSE, CAPILLARY: Glucose-Capillary: 126 mg/dL — ABNORMAL HIGH (ref 65–99)

## 2015-01-19 SURGERY — PHACOEMULSIFICATION, CATARACT, WITH IOL INSERTION
Anesthesia: Monitor Anesthesia Care | Site: Eye | Laterality: Left | Wound class: Clean

## 2015-01-19 MED ORDER — EPINEPHRINE HCL 1 MG/ML IJ SOLN
INTRAMUSCULAR | Status: AC
Start: 2015-01-19 — End: 2015-01-19
  Filled 2015-01-19: qty 2

## 2015-01-19 MED ORDER — LIDOCAINE HCL (PF) 4 % IJ SOLN
INTRAOCULAR | Status: DC | PRN
  Administered 2015-01-19: 4 mL via OPHTHALMIC

## 2015-01-19 MED ORDER — CEFUROXIME OPHTHALMIC INJECTION 1 MG/0.1 ML
INJECTION | OPHTHALMIC | Status: AC
  Filled 2015-01-19: qty 0.1

## 2015-01-19 MED ORDER — TETRACAINE HCL 0.5 % OP SOLN
OPHTHALMIC | Status: AC
  Administered 2015-01-19: 1 [drp] via OPHTHALMIC
  Filled 2015-01-19: qty 2

## 2015-01-19 MED ORDER — MOXIFLOXACIN HCL 0.5 % OP SOLN: 1.0000 [drp] | OPHTHALMIC | Status: DC | PRN

## 2015-01-19 MED ORDER — MOXIFLOXACIN HCL 0.5 % OP SOLN
OPHTHALMIC | Status: AC
  Filled 2015-01-19: qty 3

## 2015-01-19 MED ORDER — MOXIFLOXACIN HCL 0.5 % OP SOLN
OPHTHALMIC | Status: DC | PRN
  Administered 2015-01-19: 1 [drp] via OPHTHALMIC

## 2015-01-19 MED ORDER — POVIDONE-IODINE 5 % OP SOLN
OPHTHALMIC | Status: AC
  Administered 2015-01-19: 1 via OPHTHALMIC
  Filled 2015-01-19: qty 30

## 2015-01-19 MED ORDER — TETRACAINE HCL 0.5 % OP SOLN
1.0000 [drp] | OPHTHALMIC | Status: AC | PRN
  Administered 2015-01-19: 1 [drp] via OPHTHALMIC

## 2015-01-19 MED ORDER — TRYPAN BLUE 0.06 % OP SOLN
OPHTHALMIC | Status: AC
  Filled 2015-01-19: qty 0.5

## 2015-01-19 MED ORDER — ARMC OPHTHALMIC DILATING GEL
OPHTHALMIC | Status: AC
  Administered 2015-01-19: 1 via OPHTHALMIC
  Filled 2015-01-19: qty 0.25

## 2015-01-19 MED ORDER — FENTANYL CITRATE (PF) 100 MCG/2ML IJ SOLN
INTRAMUSCULAR | Status: DC | PRN
Start: 2015-01-19 — End: 2015-01-19
  Administered 2015-01-19: 25 ug via INTRAVENOUS

## 2015-01-19 MED ORDER — CEFUROXIME OPHTHALMIC INJECTION 1 MG/0.1 ML
INJECTION | OPHTHALMIC | Status: DC | PRN
  Administered 2015-01-19: 0.1 mL via INTRACAMERAL

## 2015-01-19 MED ORDER — TRYPAN BLUE 0.06 % OP SOLN
OPHTHALMIC | Status: DC | PRN
  Administered 2015-01-19: 0.5 mL via INTRAOCULAR

## 2015-01-19 MED ORDER — SODIUM CHLORIDE 0.9 % IV SOLN
INTRAVENOUS | Status: DC
  Administered 2015-01-19: 09:00:00 via INTRAVENOUS

## 2015-01-19 MED ORDER — POVIDONE-IODINE 5 % OP SOLN
1.0000 "application " | OPHTHALMIC | Status: AC | PRN
  Administered 2015-01-19: 1 via OPHTHALMIC

## 2015-01-19 MED ORDER — LIDOCAINE HCL (PF) 4 % IJ SOLN
INTRAMUSCULAR | Status: AC
  Filled 2015-01-19: qty 5

## 2015-01-19 MED ORDER — BSS IO SOLN
INTRAOCULAR | Status: DC | PRN
  Administered 2015-01-19: 200 mL via OPHTHALMIC

## 2015-01-19 MED ORDER — NA CHONDROIT SULF-NA HYALURON 40-17 MG/ML IO SOLN
INTRAOCULAR | Status: AC
  Filled 2015-01-19: qty 1

## 2015-01-19 MED ORDER — ARMC OPHTHALMIC DILATING GEL
1.0000 | OPHTHALMIC | Status: AC | PRN
Start: 2015-01-19 — End: 2015-01-19
  Administered 2015-01-19 (×2): 1 via OPHTHALMIC

## 2015-01-19 MED ORDER — NA CHONDROIT SULF-NA HYALURON 40-17 MG/ML IO SOLN
INTRAOCULAR | Status: DC | PRN
  Administered 2015-01-19: 1 mL via INTRAOCULAR

## 2015-01-19 SURGICAL SUPPLY — 22 items
CANNULA ANT/CHMB 27GA (MISCELLANEOUS) ×2 IMPLANT
CUP MEDICINE 2OZ PLAST GRAD ST (MISCELLANEOUS) ×2 IMPLANT
GLOVE BIO SURGEON STRL SZ8 (GLOVE) ×2 IMPLANT
GLOVE BIOGEL M 6.5 STRL (GLOVE) ×2 IMPLANT
GLOVE SURG LX 8.0 MICRO (GLOVE) ×1
GLOVE SURG LX STRL 8.0 MICRO (GLOVE) ×1 IMPLANT
GOWN STRL REUS W/ TWL LRG LVL3 (GOWN DISPOSABLE) ×2 IMPLANT
GOWN STRL REUS W/TWL LRG LVL3 (GOWN DISPOSABLE) ×2
LENS IOL TECNIS 21.5 (Intraocular Lens) ×2 IMPLANT
LENS IOL TECNIS MONO 1P 21.5 (Intraocular Lens) ×1 IMPLANT
PACK CATARACT (MISCELLANEOUS) ×2 IMPLANT
PACK CATARACT BRASINGTON LX (MISCELLANEOUS) ×2 IMPLANT
PACK EYE AFTER SURG (MISCELLANEOUS) ×2 IMPLANT
SOL BSS BAG (MISCELLANEOUS) ×2
SOL PREP PVP 2OZ (MISCELLANEOUS) ×2
SOLUTION BSS BAG (MISCELLANEOUS) ×1 IMPLANT
SOLUTION PREP PVP 2OZ (MISCELLANEOUS) ×1 IMPLANT
SYR 3ML LL SCALE MARK (SYRINGE) ×2 IMPLANT
SYR 5ML LL (SYRINGE) ×2 IMPLANT
SYR TB 1ML 27GX1/2 LL (SYRINGE) ×2 IMPLANT
WATER STERILE IRR 1000ML POUR (IV SOLUTION) ×2 IMPLANT
WIPE NON LINTING 3.25X3.25 (MISCELLANEOUS) ×2 IMPLANT

## 2015-01-19 NOTE — Discharge Instructions (Addendum)
FOLLOW DR PORFILIO'S POSTOP EYE DROP INSTRUCTION SHEET AS REVIEWED  Eye Surgery Discharge Instructions  Expect mild scratchy sensation or mild soreness. DO NOT RUB YOUR EYE!  The day of surgery:  Minimal physical activity, but bed rest is not required  No reading, computer work, or close hand work  No bending, lifting, or straining.  May watch TV  For 24 hours:  No driving, legal decisions, or alcoholic beverages  Safety precautions  Eat anything you prefer: It is better to start with liquids, then soup then solid foods.  _____ Eye patch should be worn until postoperative exam tomorrow.  ____ Solar shield eyeglasses should be worn for comfort in the sunlight/patch while sleeping  Resume all regular medications including aspirin or Coumadin if these were discontinued prior to surgery. You may shower, bathe, shave, or wash your hair. Tylenol may be taken for mild discomfort.  Call your doctor if you experience significant pain, nausea, or vomiting, fever > 101 or other signs of infection. [phone] or [phone] Specific instructions:  Follow-up Information    Follow up with Carlena Bjornstad, MD.   Specialty:  Ophthalmology   Why:  January 20, 2015 @ 9:40 am   Contact information:   [address] [place] [address] [phone]

## 2015-01-19 NOTE — Anesthesia Preprocedure Evaluation (Signed)
Anesthesia Evaluation  Patient identified by MRN, date of birth, ID band Patient awake    Reviewed: Allergy & Precautions, NPO status , Patient's Chart, lab work & pertinent test results  Airway Mallampati: II  TM Distance: >3 FB Neck ROM: Full    Dental  (+) Poor Dentition   Pulmonary asthma , former smoker,    Pulmonary exam normal        Cardiovascular hypertension, Normal cardiovascular exam     Neuro/Psych PSYCHIATRIC DISORDERS dementia   GI/Hepatic negative GI ROS, Neg liver ROS,   Endo/Other  diabetes, Well Controlled, Type 2  Renal/GU negative Renal ROS  negative genitourinary   Musculoskeletal negative musculoskeletal ROS (+)   Abdominal Normal abdominal exam  (+)   Peds negative pediatric ROS (+)  Hematology negative hematology ROS (+)   Anesthesia Other Findings Past Medical History:   Diabetes mellitus without complication                       Cataract                                                     Dementia                                                     Hypertension                                                 HOH (hard of hearing)                                        Asthma                                                         Comment:as a child   Reproductive/Obstetrics                             Anesthesia Physical  Anesthesia Plan  ASA: III  Anesthesia Plan: MAC   Post-op Pain Management:    Induction: Intravenous  Airway Management Planned: Nasal Cannula  Additional Equipment:   Intra-op Plan:   Post-operative Plan:   Informed Consent: I have reviewed the patients History and Physical, chart, labs and discussed the procedure including the risks, benefits and alternatives for the proposed anesthesia with the patient or authorized representative who has indicated his/her understanding and acceptance.   Dental advisory given  Plan  Discussed with: CRNA, Surgeon and Anesthesiologist  Anesthesia Plan Comments:         Anesthesia Quick Evaluation

## 2015-01-19 NOTE — Anesthesia Postprocedure Evaluation (Signed)
  Anesthesia Post-op Note  Patient: Tyler Jimenez  Procedure(s) Performed: Procedure(s) with comments: CATARACT EXTRACTION PHACO AND INTRAOCULAR LENS PLACEMENT (IOC) (Left) - [place]: 03:59.3 AP%: 28.9 CDE: 69.13 Lot # [id number]  Anesthesia type:MAC  Patient location: PACU  Post pain: Pain level controlled  Post assessment: Post-op Vital signs reviewed, Patient's Cardiovascular Status Stable, Respiratory Function Stable, Patent Airway and No signs of Nausea or vomiting  Post vital signs: Reviewed and stable  Last Vitals:  Filed Vitals:   01/19/15 1048  BP: 163/61  Pulse: 62  Temp: 36.8 C  Resp: 16    Level of consciousness: awake, alert  and patient cooperative  Complications: No apparent anesthesia complications

## 2015-01-19 NOTE — H&P (Signed)
  All labs reviewed. Abnormal studies sent to patients PCP when indicated.  Previous H&P reviewed, patient examined, there are NO CHANGES.  Tyler Jimenez LOUIS9/13/201610:07 AM

## 2015-01-19 NOTE — Transfer of Care (Signed)
Immediate Anesthesia Transfer of Care Note  Patient: Tyler Jimenez  Procedure(s) Performed: Procedure(s) with comments: CATARACT EXTRACTION PHACO AND INTRAOCULAR LENS PLACEMENT (IOC) (Left) - [place]: 03:59.3 AP%: 28.9 CDE: 69.13 Lot # [id number]  Patient Location: PACU  Anesthesia Type:MAC  Level of Consciousness: awake, alert , oriented and patient cooperative  Airway & Oxygen Therapy: Patient Spontanous Breathing  Post-op Assessment: Report given to RN and Post -op Vital signs reviewed and stable  Post vital signs: Reviewed and stable  Last Vitals:  Filed Vitals:   01/19/15 1048  BP: 163/61  Pulse: 62  Temp: 36.8 C  Resp: 16    Complications: No apparent anesthesia complications

## 2015-01-19 NOTE — Op Note (Signed)
PREOPERATIVE DIAGNOSIS:  Nuclear sclerotic cataract of the left eye.   POSTOPERATIVE DIAGNOSIS:  left nuclear sclerotic cataract   OPERATIVE PROCEDURE:  Procedure(s): CATARACT EXTRACTION PHACO AND INTRAOCULAR LENS PLACEMENT (IOC)   SURGEON:  Galen Manila, MD.   ANESTHESIA:   Anesthesiologist: Lenard Simmer, MD CRNA: Darrol Jump, CRNA  1.      Managed anesthesia care. 2.      Topical tetracaine drops followed by 2% Xylocaine jelly applied in the preoperative holding area.   COMPLICATIONS:  None.   TECHNIQUE:   Stop and chop   DESCRIPTION OF PROCEDURE:  The patient was examined and consented in the preoperative holding area where the aforementioned topical anesthesia was applied to the left eye and then brought back to the Operating Room where the left eye was prepped and draped in the usual sterile ophthalmic fashion and a lid speculum was placed. A paracentesis was created with the side port blade, Vision Blue was used to stain the anterior capsule due to very poor/ no visualization of the red reflex,.  and the anterior chamber was filled with viscoelastic. A near clear corneal incision was performed with the steel keratome. A continuous curvilinear capsulorrhexis was performed with a cystotome followed by the capsulorrhexis forceps. Hydrodissection and hydrodelineation were carried out with BSS on a blunt cannula. The lens was removed in a stop and chop  technique and the remaining cortical material was removed with the irrigation-aspiration handpiece. The capsular bag was inflated with viscoelastic and the Technis ZCB00 lens was placed in the capsular bag without complication. The remaining viscoelastic was removed from the eye with the irrigation-aspiration handpiece. The wounds were hydrated. The anterior chamber was flushed with Miostat and the eye was inflated to physiologic pressure. 0.1 mL of cefuroxime concentration 10 mg/mL was placed in the anterior chamber. The wounds were  found to be water tight. The eye was dressed with Vigamox. The patient was given protective glasses to wear throughout the day and a shield with which to sleep tonight. The patient was also given drops with which to begin a drop regimen today and will follow-up with me in one day.  Implant Name Type Inv. Item Serial No. Manufacturer Lot No. LRB No. Used  LENS IMPL INTRAOC [id number] 21.5 - [id number] Intraocular Lens LENS IMPL INTRAOC ZCB00 21.5 [id number]   Left 1   Procedure(s) with comments: CATARACT EXTRACTION PHACO AND INTRAOCULAR LENS PLACEMENT (IOC) (Left) - [place]: 03:59.3 AP%: 28.9 CDE: 69.13 Lot # [id number]  Electronically signed: Alexine Pilant LOUIS 01/19/2015 10:46 AM

## 2015-04-07 DIAGNOSIS — E1165 Type 2 diabetes mellitus with hyperglycemia: Secondary | ICD-10-CM | POA: Diagnosis not present

## 2015-04-07 DIAGNOSIS — Z23 Encounter for immunization: Secondary | ICD-10-CM | POA: Diagnosis not present

## 2015-04-07 DIAGNOSIS — Z0001 Encounter for general adult medical examination with abnormal findings: Secondary | ICD-10-CM | POA: Diagnosis not present

## 2015-04-07 DIAGNOSIS — I10 Essential (primary) hypertension: Secondary | ICD-10-CM | POA: Diagnosis not present

## 2015-04-07 DIAGNOSIS — E782 Mixed hyperlipidemia: Secondary | ICD-10-CM | POA: Diagnosis not present

## 2015-04-07 DIAGNOSIS — G309 Alzheimer's disease, unspecified: Secondary | ICD-10-CM | POA: Diagnosis not present

## 2015-04-07 DIAGNOSIS — R972 Elevated prostate specific antigen [PSA]: Secondary | ICD-10-CM | POA: Diagnosis not present

## 2015-05-25 ENCOUNTER — Encounter: Payer: Self-pay | Admitting: Internal Medicine

## 2015-06-01 ENCOUNTER — Ambulatory Visit: Payer: Self-pay

## 2015-06-04 ENCOUNTER — Ambulatory Visit: Payer: Self-pay

## 2015-06-11 ENCOUNTER — Other Ambulatory Visit: Payer: Self-pay

## 2015-06-11 ENCOUNTER — Encounter: Payer: Self-pay | Admitting: Urology

## 2015-06-11 ENCOUNTER — Ambulatory Visit (INDEPENDENT_AMBULATORY_CARE_PROVIDER_SITE_OTHER): Payer: Medicare Other | Admitting: Urology

## 2015-06-11 VITALS — BP 140/83 | HR 71 | Ht 63.5 in | Wt 190.0 lb

## 2015-06-11 DIAGNOSIS — R972 Elevated prostate specific antigen [PSA]: Secondary | ICD-10-CM

## 2015-06-11 LAB — URINALYSIS, COMPLETE
Bilirubin, UA: NEGATIVE
GLUCOSE, UA: NEGATIVE
Ketones, UA: NEGATIVE
Leukocytes, UA: NEGATIVE
NITRITE UA: NEGATIVE
PH UA: 5 (ref 5.0–7.5)
Protein, UA: NEGATIVE
Specific Gravity, UA: 1.025 (ref 1.005–1.030)
UUROB: 0.2 mg/dL (ref 0.2–1.0)

## 2015-06-11 LAB — MICROSCOPIC EXAMINATION: EPITHELIAL CELLS (NON RENAL): NONE SEEN /HPF (ref 0–10)

## 2015-06-11 NOTE — Progress Notes (Signed)
06/11/2015 2:22 PM   Tyler Jimenez 1931/10/01 [id number]  Referring provider: No referring provider defined for this encounter.  Chief Complaint  Patient presents with  . New Patient (Initial Visit)    elevated PSA    HPI: The patient is a 82 year old gentleman with dementia presents for elevated PSA of 6.9. The patient is unable to provide the medical history at this time. He is accompanied by his son-in-law who provides some of the details. The patient denies any urinary symptoms including no nocturia. He has no previous history of prostate cancer. His father died from prostate cancer in his 71s. He denies history of hematuria and nephrolithiasis.   PMH: Past Medical History  Diagnosis Date  . Diabetes mellitus without complication (HCC)   . Cataract   . Dementia   . Hypertension   . HOH (hard of hearing)   . Asthma     as a child    Surgical History: Past Surgical History  Procedure Laterality Date  . Cataract extraction w/phaco Right 01/05/2015    Procedure: CATARACT EXTRACTION PHACO AND INTRAOCULAR LENS PLACEMENT (IOC);  Surgeon: Galen Manila, MD;  Location: [facility] ORS;  Service: Ophthalmology;  Laterality: Right;  [place]: 02:33.9 AP%: 29.9 CDE: 46.04 Lot# [id number]  . Cataract extraction w/phaco Left 01/19/2015    Procedure: CATARACT EXTRACTION PHACO AND INTRAOCULAR LENS PLACEMENT (IOC);  Surgeon: Galen Manila, MD;  Location: [facility] ORS;  Service: Ophthalmology;  Laterality: Left;  [place]: 03:59.3 AP%: 28.9 CDE: 69.13 Lot # [id number]    Home Medications:    Medication List       This list is accurate as of: 06/11/15  2:22 PM.  Always use your most recent med list.               amLODipine 10 MG tablet  Commonly known as:  NORVASC  Take 10 mg by mouth daily.     glimepiride 2 MG tablet  Commonly known as:  AMARYL     KOMBIGLYZE XR 09-998 MG Tb24  Generic drug:  Saxagliptin-Metformin     pioglitazone 45 MG tablet  Commonly known as:  ACTOS  Take 45  mg by mouth daily.        Allergies: No Known Allergies  Family History: Family History  Problem Relation Age of Onset  . Prostate cancer Father   . Bladder Cancer Neg Hx   . Kidney cancer Neg Hx     Social History:  reports that he has quit smoking. He does not have any smokeless tobacco history on file. He reports that he drinks alcohol. He reports that he does not use illicit drugs.  ROS:                                        Physical Exam: BP 140/83 mmHg  Pulse 71  Ht 5' 3.5" (1.613 m)  Wt 190 lb (86.183 kg)  BMI 33.12 kg/m2  Constitutional:  Alert and oriented, No acute distress. HEENT: [place] AT, moist mucus membranes.  Trachea midline, no masses. Cardiovascular: No clubbing, cyanosis, or edema. Respiratory: Normal respiratory effort, no increased work of breathing. GI: Abdomen is soft, nontender, nondistended, no abdominal masses GU: No CVA tenderness.  Skin: No rashes, bruises or suspicious lesions. Lymph: No cervical or inguinal adenopathy. Neurologic: Grossly intact, no focal deficits, moving all 4 extremities. Psychiatric: Normal mood and affect.  Laboratory Data: Lab Results  Component Value Date   WBC 4.4 10/30/2014   HGB 14.4 10/30/2014   HCT 44.5 10/30/2014   MCV 85.1 10/30/2014   PLT 175 10/30/2014    Lab Results  Component Value Date   CREATININE 1.11 10/30/2014    No results found for: PSA  No results found for: TESTOSTERONE  No results found for: HGBA1C  Urinalysis No results found for: COLORURINE, APPEARANCEUR, LABSPEC, PHURINE, GLUCOSEU, HGBUR, BILIRUBINUR, KETONESUR, PROTEINUR, UROBILINOGEN, NITRITE, LEUKOCYTESUR    Assessment & Plan:    I discussed with the patient's son-in-law in great detail the clinical utility of PSA.  He understands that it is a screening test for prostate cancer. We discussed that generally PSA testing is reserved for men with at least a 10 year life expectancy. We also discussed the  indolent nature of the disease. The son-in-law was advised that given the patient's overall medical comorbidities including worsening dementia that if the patient did in fact have prostate cancer, it would more than likely never be an issue for him.  The patient was unable to participate in this discussion due to his underlying dementia. Given the patient's age and medical comorbidities, I do not feel that further PSA testing in this patient is necessary.  1. Elevated PSA -Recommend against further PSA testing in this patient -Follow up when necessary   Return if symptoms worsen or fail to improve.  Hildred Laser, MD  [facility] [address], [address] [place], [place] [address] [phone]

## 2015-06-13 ENCOUNTER — Emergency Department: Payer: Medicare Other

## 2015-06-13 ENCOUNTER — Encounter: Payer: Self-pay | Admitting: Emergency Medicine

## 2015-06-13 ENCOUNTER — Emergency Department
Admission: EM | Admit: 2015-06-13 | Discharge: 2015-06-13 | Disposition: A | Discharge status: Home / Self Care | Payer: Medicare Other | Attending: Emergency Medicine | Admitting: Emergency Medicine

## 2015-06-13 DIAGNOSIS — E119 Type 2 diabetes mellitus without complications: Secondary | ICD-10-CM

## 2015-06-13 DIAGNOSIS — L309 Dermatitis, unspecified: Secondary | ICD-10-CM | POA: Diagnosis not present

## 2015-06-13 DIAGNOSIS — Z87891 Personal history of nicotine dependence: Secondary | ICD-10-CM | POA: Diagnosis not present

## 2015-06-13 DIAGNOSIS — R410 Disorientation, unspecified: Secondary | ICD-10-CM | POA: Diagnosis not present

## 2015-06-13 DIAGNOSIS — Z79899 Other long term (current) drug therapy: Secondary | ICD-10-CM | POA: Diagnosis not present

## 2015-06-13 DIAGNOSIS — F0390 Unspecified dementia without behavioral disturbance: Secondary | ICD-10-CM

## 2015-06-13 DIAGNOSIS — I10 Essential (primary) hypertension: Secondary | ICD-10-CM | POA: Insufficient documentation

## 2015-06-13 DIAGNOSIS — F99 Mental disorder, not otherwise specified: Secondary | ICD-10-CM | POA: Diagnosis present

## 2015-06-13 DIAGNOSIS — R000 Tachycardia, unspecified: Secondary | ICD-10-CM | POA: Diagnosis not present

## 2015-06-13 LAB — GLUCOSE, CAPILLARY: Glucose-Capillary: 152 mg/dL — ABNORMAL HIGH (ref 65–99)

## 2015-06-13 NOTE — ED Provider Notes (Signed)
[facility] Emergency Department Provider Note  ____________________________________________  Time seen: 7:05 AM  I have reviewed the triage vital signs and the nursing notes.   HISTORY  Chief Complaint Mental Health Problem    HPI Tyler Jimenez is a 82 y.o. male brought to the ED via [place] police who found the man walking near some abandoned building's. The patient seemed confused and thought he was in the [place] and so was brought to the ED for evaluation. Patient denies any acute complaints. States he feels fine and take some of his medications. States that he lives at [employer] in the [place] in [place].When asked about what he is doing in [place], he replies that he had previously traveled to [place] to visit his daughter, most recently one or 2 weeks ago. He states that he got home tonight city by taking the trailer. He does not appear to be aware that he is still physically in [place].  Denies falls or trauma.   Past Medical History  Diagnosis Date  . Diabetes mellitus without complication (HCC)   . Cataract   . Dementia   . Hypertension   . HOH (hard of hearing)   . Asthma     as a child     There are no active problems to display for this patient.    Past Surgical History  Procedure Laterality Date  . Cataract extraction w/phaco Right 01/05/2015    Procedure: CATARACT EXTRACTION PHACO AND INTRAOCULAR LENS PLACEMENT (IOC);  Surgeon: Galen Manila, MD;  Location: [facility] ORS;  Service: Ophthalmology;  Laterality: Right;  [place]: 02:33.9 AP%: 29.9 CDE: 46.04 Lot# [id number]  . Cataract extraction w/phaco Left 01/19/2015    Procedure: CATARACT EXTRACTION PHACO AND INTRAOCULAR LENS PLACEMENT (IOC);  Surgeon: Galen Manila, MD;  Location: [facility] ORS;  Service: Ophthalmology;  Laterality: Left;  [place]: 03:59.3 AP%: 28.9 CDE: 69.13 Lot # [id number]     Current Outpatient Rx  Name  Route  Sig  Dispense  Refill  .  amLODipine (NORVASC) 10 MG tablet   Oral   Take 10 mg by mouth daily.         Marland Kitchen glimepiride (AMARYL) 2 MG tablet               . KOMBIGLYZE XR 09-998 MG TB24                 Dispense as written.   . pioglitazone (ACTOS) 45 MG tablet   Oral   Take 45 mg by mouth daily.            Allergies Review of patient's allergies indicates no known allergies.   Family History  Problem Relation Age of Onset  . Prostate cancer Father   . Bladder Cancer Neg Hx   . Kidney cancer Neg Hx     Social History Social History  Substance Use Topics  . Smoking status: Former Games developer  . Smokeless tobacco: None  . Alcohol Use: 0.0 oz/week    0 Standard drinks or equivalent per week     Comment: occ    Review of Systems  Constitutional:   No fever or chills. No weight changes Eyes:   No blurry vision or double vision.  ENT:   No sore throat. Cardiovascular:   No chest pain. Respiratory:   No dyspnea or cough. Gastrointestinal:   Negative for abdominal pain, vomiting and diarrhea.  No BRBPR or melena. Genitourinary:   Negative for dysuria, urinary  retention, bloody urine, or difficulty urinating. Musculoskeletal:   Negative for back pain. No joint swelling or pain. Skin:   Negative for rash. Neurological:   Negative for headaches, focal weakness or numbness. Psychiatric:  No anxiety or depression.  No SI or HI or hallucinations Endocrine:  No hot/cold intolerance, changes in energy, or sleep difficulty.  10-point ROS otherwise negative.  ____________________________________________   PHYSICAL EXAM:  VITAL SIGNS: ED Triage Vitals  Enc Vitals Group     BP 06/13/15 0628 151/79 mmHg     Pulse Rate 06/13/15 0628 106     Resp 06/13/15 0628 20     Temp 06/13/15 0628 97.7 F (36.5 C)     Temp Source 06/13/15 0628 Oral     SpO2 06/13/15 0628 96 %     Weight 06/13/15 0628 180 lb (81.647 kg)     Height 06/13/15 0628  (1.651 m)     Head Cir --      Peak Flow --       Pain Score 06/13/15 0631 0     Pain Loc --      Pain Edu? --      Excl. in GC? --     Vital signs reviewed, nursing assessments reviewed.   Constitutional:   Alert and oriented to self. Well appearing and in no distress. Eyes:   No scleral icterus. No conjunctival pallor. PERRL. EOMI ENT   Head:   Normocephalic and atraumatic.   Nose:   No congestion/rhinnorhea. No septal hematoma   Mouth/Throat:   MMM, no pharyngeal erythema. No peritonsillar mass. No uvula shift.   Neck:   No stridor. No SubQ emphysema. No meningismus. Hematological/Lymphatic/Immunilogical:   No cervical lymphadenopathy. Cardiovascular:   Tachycardia heart rate 105. Normal and symmetric distal pulses are present in all extremities. No murmurs, rubs, or gallops. Respiratory:   Normal respiratory effort without tachypnea nor retractions. Breath sounds are clear and equal bilaterally. No wheezes/rales/rhonchi. Gastrointestinal:   Soft and nontender. No distention. There is no CVA tenderness.  No rebound, rigidity, or guarding. Genitourinary:   deferred Musculoskeletal:   Nontender with normal range of motion in all extremities. No joint effusions.  No lower extremity tenderness.  No edema. Neurologic:   Normal speech and language.  CN 2-10 normal. Motor grossly intact. No pronator drift.  Normal gait. No gross focal neurologic deficits are appreciated.  Skin:    Skin is warm, dry and intact. No rash noted.  No petechiae, purpura, or bullae. Psychiatric:   Mood and affect are normal. Speech and behavior are normal. Impaired insight ____________________________________________    LABS (pertinent positives/negatives) (all labs ordered are listed, but only abnormal results are displayed) Labs Reviewed  GLUCOSE, CAPILLARY  CBG MONITORING, ED   ____________________________________________   EKG    ____________________________________________     RADIOLOGY    ____________________________________________   PROCEDURES   ____________________________________________   INITIAL IMPRESSION / ASSESSMENT AND PLAN / ED COURSE  Pertinent labs & imaging results that were available during my care of the patient were reviewed by me and considered in my medical decision making (see chart for details).  Patient presents primarily with confusion. Appears to think that he is currently in [place] and is completely unaware that he is in [place]. Denies any acute complaints, exam does not reveal any other abnormalities other than disorientation. We'll proceed with medical workup including labs urine and imaging. We'll try to reach local family.    ----------------------------------------- 7:50 AM on  06/13/2015 -----------------------------------------  Discussed with the daughter reports the patient is at his chronic baseline and has had no acute complaints recently. She feels that this is due to his chronic dementia and that he does frequently wander off. She plans to follow up with primary care as well as seek some specialized services for dementia care. I suggested that he probably does not need an extensive workup at this point in which she agrees. Fingerstick is 150. Will discharge home with outpatient resources for dementia care. I spoke with radiology to cancel the CT head and chest x-ray.   ____________________________________________   FINAL CLINICAL IMPRESSION(S) / ED DIAGNOSES  Final diagnoses:  Chronic dementia, without behavioral disturbance  Diabetes mellitus without complication [facility])      Sharman Cheek, MD 06/13/15 [phone]

## 2015-06-13 NOTE — Discharge Instructions (Signed)

## 2015-06-13 NOTE — ED Notes (Signed)
Secretary spoke with daughter on the phone just now about patient. Daughter states patient has advanced dementia and he must have wondered out the house. Daughter wants to come pick up patient. MD Scotty Court talking to daughter on the phone now.

## 2015-06-13 NOTE — Progress Notes (Signed)
LCSW was asked to meet pt daughter in lobby. Resources for elder care were discussed and PACE program was provided to patient. Daughter had looked at having her father placed but the costs of a memory care unit is out of their reach. LCSW provided a Film/video editor for day programs and facilities. She will also consider a "sitter at night" They had the alarm set it went off at 2am they thought it malfunctioned, her father has never left the house before and home care has been consist ant for 2 years.  Patient was polite and left with his daughter.  Tyler Jimenez Tyler Moloney LCSW Clinical Social Worker [phone]

## 2015-06-13 NOTE — ED Notes (Signed)
Pt here with [facility]; deputy says he was on his way to work when he noticed pt walking near some abandoned buildings; pt thought he was in [address]; does not know where he lives; unsure how long pt was outside; pt has no complaints; alert to person and time; talking in complete coherent sentences

## 2015-06-14 NOTE — Progress Notes (Signed)
LCSW received follow up call from APS [place] [phone] and we discussed the unusual circumstances of this man disappearing unnoticed. He was from [place] and has been with his daughter and son in law for 2 years. Husband thought the alarm malfunctioned. LCSW looked up to see about address ( only PO box) but did provide phone #s for daughter and son in law.   LCSW did convey to APS the family was upset and very attentive, patient came in at 6:30am- They were notified at 7:30am and care providers were  in ED lobby at 8:30am  this worker did not have protection concerns at all with this patient or family and disclosed that to Ms Daphine Deutscher ( she reported other bystander called it in) They received several eldercare resource handouts and were very open for discussion to increase supports. Ms Daphine Deutscher will follow up.  [employer] LCSW [phone]

## 2015-06-22 ENCOUNTER — Other Ambulatory Visit: Payer: Self-pay | Admitting: Internal Medicine

## 2015-06-22 DIAGNOSIS — E1165 Type 2 diabetes mellitus with hyperglycemia: Secondary | ICD-10-CM | POA: Diagnosis not present

## 2015-06-22 DIAGNOSIS — G309 Alzheimer's disease, unspecified: Secondary | ICD-10-CM | POA: Diagnosis not present

## 2015-06-22 DIAGNOSIS — Z048 Encounter for examination and observation for other specified reasons: Secondary | ICD-10-CM | POA: Diagnosis not present

## 2015-06-22 DIAGNOSIS — Z111 Encounter for screening for respiratory tuberculosis: Secondary | ICD-10-CM | POA: Diagnosis not present

## 2015-06-22 DIAGNOSIS — I10 Essential (primary) hypertension: Secondary | ICD-10-CM | POA: Diagnosis not present

## 2015-06-22 DIAGNOSIS — E782 Mixed hyperlipidemia: Secondary | ICD-10-CM | POA: Diagnosis not present

## 2015-06-22 DIAGNOSIS — F0280 Dementia in other diseases classified elsewhere without behavioral disturbance: Secondary | ICD-10-CM

## 2015-06-25 ENCOUNTER — Ambulatory Visit: Admission: RE | Admit: 2015-06-25 | Payer: Medicare Other | Source: Ambulatory Visit

## 2015-08-13 DIAGNOSIS — G309 Alzheimer's disease, unspecified: Secondary | ICD-10-CM | POA: Diagnosis not present

## 2015-08-13 DIAGNOSIS — I10 Essential (primary) hypertension: Secondary | ICD-10-CM | POA: Diagnosis not present

## 2015-08-13 DIAGNOSIS — E1165 Type 2 diabetes mellitus with hyperglycemia: Secondary | ICD-10-CM | POA: Diagnosis not present

## 2015-09-30 DIAGNOSIS — L209 Atopic dermatitis, unspecified: Secondary | ICD-10-CM | POA: Diagnosis not present

## 2015-09-30 DIAGNOSIS — G309 Alzheimer's disease, unspecified: Secondary | ICD-10-CM | POA: Diagnosis not present

## 2015-09-30 DIAGNOSIS — I10 Essential (primary) hypertension: Secondary | ICD-10-CM | POA: Diagnosis not present

## 2015-09-30 DIAGNOSIS — E119 Type 2 diabetes mellitus without complications: Secondary | ICD-10-CM | POA: Diagnosis not present

## 2015-12-17 DIAGNOSIS — E113391 Type 2 diabetes mellitus with moderate nonproliferative diabetic retinopathy without macular edema, right eye: Secondary | ICD-10-CM | POA: Diagnosis not present

## 2015-12-30 DIAGNOSIS — M25551 Pain in right hip: Secondary | ICD-10-CM | POA: Diagnosis not present

## 2015-12-30 DIAGNOSIS — G309 Alzheimer's disease, unspecified: Secondary | ICD-10-CM | POA: Diagnosis not present

## 2015-12-30 DIAGNOSIS — E119 Type 2 diabetes mellitus without complications: Secondary | ICD-10-CM | POA: Diagnosis not present

## 2015-12-30 DIAGNOSIS — I10 Essential (primary) hypertension: Secondary | ICD-10-CM | POA: Diagnosis not present

## 2016-01-15 ENCOUNTER — Ambulatory Visit
Admission: RE | Admit: 2016-01-15 | Discharge: 2016-01-15 | Disposition: A | Discharge status: Home / Self Care | Payer: Medicare Other | Source: Ambulatory Visit | Attending: Nurse Practitioner | Admitting: Nurse Practitioner

## 2016-01-15 ENCOUNTER — Other Ambulatory Visit: Payer: Self-pay | Admitting: Nurse Practitioner

## 2016-01-15 DIAGNOSIS — M1611 Unilateral primary osteoarthritis, right hip: Secondary | ICD-10-CM | POA: Diagnosis not present

## 2016-01-15 DIAGNOSIS — M25551 Pain in right hip: Secondary | ICD-10-CM | POA: Diagnosis not present

## 2016-04-13 DIAGNOSIS — Z0001 Encounter for general adult medical examination with abnormal findings: Secondary | ICD-10-CM | POA: Diagnosis not present

## 2016-04-13 DIAGNOSIS — G309 Alzheimer's disease, unspecified: Secondary | ICD-10-CM | POA: Diagnosis not present

## 2016-04-13 DIAGNOSIS — I10 Essential (primary) hypertension: Secondary | ICD-10-CM | POA: Diagnosis not present

## 2016-04-13 DIAGNOSIS — E119 Type 2 diabetes mellitus without complications: Secondary | ICD-10-CM | POA: Diagnosis not present

## 2016-04-18 ENCOUNTER — Ambulatory Visit
Admission: RE | Admit: 2016-04-18 | Discharge: 2016-04-18 | Disposition: A | Discharge status: Home / Self Care | Payer: Medicare Other | Source: Ambulatory Visit | Attending: Nurse Practitioner | Admitting: Nurse Practitioner

## 2016-04-18 ENCOUNTER — Other Ambulatory Visit: Payer: Self-pay | Admitting: Nurse Practitioner

## 2016-04-18 DIAGNOSIS — R059 Cough, unspecified: Secondary | ICD-10-CM

## 2016-04-18 DIAGNOSIS — R05 Cough: Secondary | ICD-10-CM | POA: Insufficient documentation

## 2016-04-18 DIAGNOSIS — G309 Alzheimer's disease, unspecified: Secondary | ICD-10-CM | POA: Diagnosis not present

## 2016-04-18 DIAGNOSIS — R062 Wheezing: Secondary | ICD-10-CM

## 2016-04-18 DIAGNOSIS — E119 Type 2 diabetes mellitus without complications: Secondary | ICD-10-CM | POA: Diagnosis not present

## 2016-04-18 DIAGNOSIS — J069 Acute upper respiratory infection, unspecified: Secondary | ICD-10-CM | POA: Diagnosis not present

## 2016-04-18 DIAGNOSIS — R300 Dysuria: Secondary | ICD-10-CM | POA: Diagnosis not present

## 2016-06-12 DIAGNOSIS — I10 Essential (primary) hypertension: Secondary | ICD-10-CM | POA: Diagnosis not present

## 2016-06-12 DIAGNOSIS — G309 Alzheimer's disease, unspecified: Secondary | ICD-10-CM | POA: Diagnosis not present

## 2016-06-12 DIAGNOSIS — E119 Type 2 diabetes mellitus without complications: Secondary | ICD-10-CM | POA: Diagnosis not present

## 2016-06-12 DIAGNOSIS — L209 Atopic dermatitis, unspecified: Secondary | ICD-10-CM | POA: Diagnosis not present

## 2016-10-06 DIAGNOSIS — E785 Hyperlipidemia, unspecified: Secondary | ICD-10-CM | POA: Diagnosis not present

## 2016-10-06 DIAGNOSIS — E119 Type 2 diabetes mellitus without complications: Secondary | ICD-10-CM | POA: Diagnosis not present

## 2016-10-06 DIAGNOSIS — B353 Tinea pedis: Secondary | ICD-10-CM | POA: Diagnosis not present

## 2016-10-06 DIAGNOSIS — I10 Essential (primary) hypertension: Secondary | ICD-10-CM | POA: Diagnosis not present

## 2016-10-06 DIAGNOSIS — G309 Alzheimer's disease, unspecified: Secondary | ICD-10-CM | POA: Diagnosis not present

## 2016-12-19 DIAGNOSIS — R350 Frequency of micturition: Secondary | ICD-10-CM | POA: Diagnosis not present

## 2017-01-12 DIAGNOSIS — E785 Hyperlipidemia, unspecified: Secondary | ICD-10-CM | POA: Diagnosis not present

## 2017-01-12 DIAGNOSIS — G309 Alzheimer's disease, unspecified: Secondary | ICD-10-CM | POA: Diagnosis not present

## 2017-01-12 DIAGNOSIS — B353 Tinea pedis: Secondary | ICD-10-CM | POA: Diagnosis not present

## 2017-01-12 DIAGNOSIS — E119 Type 2 diabetes mellitus without complications: Secondary | ICD-10-CM | POA: Diagnosis not present

## 2017-01-12 DIAGNOSIS — I10 Essential (primary) hypertension: Secondary | ICD-10-CM | POA: Diagnosis not present

## 2017-04-25 ENCOUNTER — Encounter: Payer: Self-pay | Admitting: Nurse Practitioner

## 2017-04-25 ENCOUNTER — Ambulatory Visit (INDEPENDENT_AMBULATORY_CARE_PROVIDER_SITE_OTHER): Payer: Medicare Other | Admitting: Nurse Practitioner

## 2017-04-25 VITALS — BP 123/75 | HR 76 | Resp 16 | Ht 63.0 in | Wt 181.4 lb

## 2017-04-25 DIAGNOSIS — E782 Mixed hyperlipidemia: Secondary | ICD-10-CM

## 2017-04-25 DIAGNOSIS — F0391 Unspecified dementia with behavioral disturbance: Secondary | ICD-10-CM | POA: Insufficient documentation

## 2017-04-25 DIAGNOSIS — E1165 Type 2 diabetes mellitus with hyperglycemia: Secondary | ICD-10-CM | POA: Diagnosis not present

## 2017-04-25 DIAGNOSIS — R011 Cardiac murmur, unspecified: Secondary | ICD-10-CM | POA: Insufficient documentation

## 2017-04-25 DIAGNOSIS — I10 Essential (primary) hypertension: Secondary | ICD-10-CM | POA: Insufficient documentation

## 2017-04-25 DIAGNOSIS — B353 Tinea pedis: Secondary | ICD-10-CM

## 2017-04-25 DIAGNOSIS — E119 Type 2 diabetes mellitus without complications: Secondary | ICD-10-CM | POA: Insufficient documentation

## 2017-04-25 DIAGNOSIS — Z0001 Encounter for general adult medical examination with abnormal findings: Secondary | ICD-10-CM

## 2017-04-25 DIAGNOSIS — F03918 Unspecified dementia, unspecified severity, with other behavioral disturbance: Secondary | ICD-10-CM | POA: Insufficient documentation

## 2017-04-25 NOTE — Patient Instructions (Signed)
Diabetes Mellitus and Nutrition When you have diabetes (diabetes mellitus), it is very important to have healthy eating habits because your blood sugar (glucose) levels are greatly affected by what you eat and drink. Eating healthy foods in the appropriate amounts, at about the same times every day, can help you:  Control your blood glucose.  Lower your risk of heart disease.  Improve your blood pressure.  Reach or maintain a healthy weight.  Every person with diabetes is different, and each person has different needs for a meal plan. Your health care provider may recommend that you work with a diet and nutrition specialist (dietitian) to make a meal plan that is best for you. Your meal plan may vary depending on factors such as:  The calories you need.  The medicines you take.  Your weight.  Your blood glucose, blood pressure, and cholesterol levels.  Your activity level.  Other health conditions you have, such as heart or kidney disease.  How do carbohydrates affect me? Carbohydrates affect your blood glucose level more than any other type of food. Eating carbohydrates naturally increases the amount of glucose in your blood. Carbohydrate counting is a method for keeping track of how many carbohydrates you eat. Counting carbohydrates is important to keep your blood glucose at a healthy level, especially if you use insulin or take certain oral diabetes medicines. It is important to know how many carbohydrates you can safely have in each meal. This is different for every person. Your dietitian can help you calculate how many carbohydrates you should have at each meal and for snack. Foods that contain carbohydrates include:  Bread, cereal, rice, pasta, and crackers.  Potatoes and corn.  Peas, beans, and lentils.  Milk and yogurt.  Fruit and juice.  Desserts, such as cakes, cookies, ice cream, and candy.  How does alcohol affect me? Alcohol can cause a sudden decrease in blood  glucose (hypoglycemia), especially if you use insulin or take certain oral diabetes medicines. Hypoglycemia can be a life-threatening condition. Symptoms of hypoglycemia (sleepiness, dizziness, and confusion) are similar to symptoms of having too much alcohol. If your health care provider says that alcohol is safe for you, follow these guidelines:  Limit alcohol intake to no more than 1 drink per day for nonpregnant women and 2 drinks per day for men. One drink equals 12 oz of beer, 5 oz of wine, or 1 oz of hard liquor.  Do not drink on an empty stomach.  Keep yourself hydrated with water, diet soda, or unsweetened iced tea.  Keep in mind that regular soda, juice, and other mixers may contain a lot of sugar and must be counted as carbohydrates.  What are tips for following this plan? Reading food labels  Start by checking the serving size on the label. The amount of calories, carbohydrates, fats, and other nutrients listed on the label are based on one serving of the food. Many foods contain more than one serving per package.  Check the total grams (g) of carbohydrates in one serving. You can calculate the number of servings of carbohydrates in one serving by dividing the total carbohydrates by 15. For example, if a food has 30 g of total carbohydrates, it would be equal to 2 servings of carbohydrates.  Check the number of grams (g) of saturated and trans fats in one serving. Choose foods that have low or no amount of these fats.  Check the number of milligrams (mg) of sodium in one serving. Most people   should limit total sodium intake to less than 2,300 mg per day.  Always check the nutrition information of foods labeled as "low-fat" or "nonfat". These foods may be higher in added sugar or refined carbohydrates and should be avoided.  Talk to your dietitian to identify your daily goals for nutrients listed on the label. Shopping  Avoid buying canned, premade, or processed foods. These  foods tend to be high in fat, sodium, and added sugar.  Shop around the outside edge of the grocery store. This includes fresh fruits and vegetables, bulk grains, fresh meats, and fresh dairy. Cooking  Use low-heat cooking methods, such as baking, instead of high-heat cooking methods like deep frying.  Cook using healthy oils, such as olive, canola, or sunflower oil.  Avoid cooking with butter, cream, or high-fat meats. Meal planning  Eat meals and snacks regularly, preferably at the same times every day. Avoid going long periods of time without eating.  Eat foods high in fiber, such as fresh fruits, vegetables, beans, and whole grains. Talk to your dietitian about how many servings of carbohydrates you can eat at each meal.  Eat 4-6 ounces of lean protein each day, such as lean meat, chicken, fish, eggs, or tofu. 1 ounce is equal to 1 ounce of meat, chicken, or fish, 1 egg, or 1/4 cup of tofu.  Eat some foods each day that contain healthy fats, such as avocado, nuts, seeds, and fish. Lifestyle   Check your blood glucose regularly.  Exercise at least 30 minutes 5 or more days each week, or as told by your health care provider.  Take medicines as told by your health care provider.  Do not use any products that contain nicotine or tobacco, such as cigarettes and e-cigarettes. If you need help quitting, ask your health care provider.  Work with a counselor or diabetes educator to identify strategies to manage stress and any emotional and social challenges. What are some questions to ask my health care provider?  Do I need to meet with a diabetes educator?  Do I need to meet with a dietitian?  What number can I call if I have questions?  When are the best times to check my blood glucose? Where to find more information:  American Diabetes Association: diabetes.org/food-and-fitness/food  Academy of Nutrition and Dietetics:  www.eatright.org/resources/health/diseases-and-conditions/diabetes  National Institute of Diabetes and Digestive and Kidney Diseases (NIH): www.niddk.nih.gov/health-information/diabetes/overview/diet-eating-physical-activity Summary  A healthy meal plan will help you control your blood glucose and maintain a healthy lifestyle.  Working with a diet and nutrition specialist (dietitian) can help you make a meal plan that is best for you.  Keep in mind that carbohydrates and alcohol have immediate effects on your blood glucose levels. It is important to count carbohydrates and to use alcohol carefully. This information is not intended to replace advice given to you by your health care provider. Make sure you discuss any questions you have with your health care provider. Document Released: 01/19/2005 Document Revised: 05/29/2016 Document Reviewed: 05/29/2016 Elsevier Interactive Patient Education  2018 Elsevier Inc.  

## 2017-04-25 NOTE — Progress Notes (Signed)
Subjective:     Patient ID: Tyler DanielsLorenzo Cleverly, male   DOB: 07-Nov-1931, 83 y.o.   MRN: [id number]  Patient her e for health maintenance exam. Continues to have trouble with ingrowing toenails with fungal infection. Was referred to podiatry at his visit 10/2016. Patient and family never heard from us or podiatry to get appointment. No changes to the nails at this time.  Did have episode last Saturday, after sitting for long period of time, where he felt weakness and numbness in his right leg. This caused him to fall. This is isolated incident on this day.  He is, otherwise doing well.    Current Meds  Medication Sig  . amLODipine (NORVASC) 10 MG tablet Take 10 mg by mouth daily.  Marland Kitchen. glimepiride (AMARYL) 2 MG tablet   . KOMBIGLYZE XR 09-998 MG TB24   . Multiple Vitamin (MULTI-VITAMINS) TABS Take by mouth.  . pioglitazone (ACTOS) 45 MG tablet Take 45 mg by mouth daily.    Review of Systems  Constitutional: Negative for activity change, appetite change and fatigue.  HENT: Negative.   Eyes: Negative.   Respiratory: Negative.  Negative for chest tightness, shortness of breath and wheezing.   Cardiovascular: Negative for chest pain and palpitations.  Gastrointestinal: Negative.   Endocrine:       Blood sugars well controlled   Genitourinary: Negative.   Allergic/Immunologic: Negative.   Neurological: Positive for weakness and numbness.       The patient is at his neurological baseline.   Psychiatric/Behavioral: Negative.    Today's Vitals   04/25/17 1533  BP: 123/75  Pulse: 76  Resp: 16  SpO2: 97%  Weight: 181 lb 6.4 oz (82.3 kg)  Height: 5\' 3"  (1.6 m)      Objective:   Physical Exam  Constitutional: He is oriented to person, place, and time. He appears well-developed and well-nourished.  HENT:  Head: Normocephalic.  Eyes: Pupils are equal, round, and reactive to light.  Neck: Normal range of motion. Neck supple. No JVD present. Carotid bruit is not present. No thyromegaly  present.  Cardiovascular: Normal rate and regular rhythm.  Pulmonary/Chest: Effort normal and breath sounds normal.  Abdominal: Soft. There is no tenderness.  Musculoskeletal: Normal range of motion.  Neurological: He is alert and oriented to person, place, and time. He has normal strength. No cranial nerve deficit or sensory deficit.  The patient is at neurological baseline.   Skin: Skin is warm and dry.     Eczema with dry and flaking skin on both lower extremities.   Psychiatric: He has a normal mood and affect.       Assessment:     Encounter for health maintenance examination with abnormal findings - Plan: Urinalysis, Routine w reflex microscopic, CBC w/Diff/Platelet, Comprehensive Metabolic Panel (CMET), Lipid Profile, Urine Microalbumin w/creat. ratio, TSH  Uncontrolled type 2 diabetes mellitus with hyperglycemia (HCC) - Plan: POCT HgB A1C, Consult to podiatry, CBC w/Diff/Platelet, Comprehensive Metabolic Panel (CMET), Lipid Profile, Urine Microalbumin w/creat. ratio  Mixed hyperlipidemia  Cardiac murmur  Tinea pedis of both feet - Plan: Consult to podiatry     Plan:    1. Annual wellness visit today 2. DM2 - HgbA1c 6.6 today. contineu diabetic medications as prescribed.  3. Non-fasting lipid panel ordered today. Adjust statin as indicated.  4. Tinea pedis of both feet - will get new referral to podiatry for further evaluation and treatment.   Follow up 4 months and sooner if needed.     Follow  up 4 months and sooner if needed

## 2017-04-26 LAB — LIPID PANEL
CHOL/HDL RATIO: 5 ratio (ref 0.0–5.0)
Cholesterol, Total: 261 mg/dL — ABNORMAL HIGH (ref 100–199)
HDL: 52 mg/dL (ref >39)
LDL Calculated: 178 mg/dL — ABNORMAL HIGH (ref 0–99)
TRIGLYCERIDES: 153 mg/dL — AB (ref 0–149)
VLDL CHOLESTEROL CAL: 31 mg/dL (ref 5–40)

## 2017-04-26 LAB — COMPREHENSIVE METABOLIC PANEL
A/G RATIO: 1.3 (ref 1.2–2.2)
ALT: 19 IU/L (ref 0–44)
AST: 20 IU/L (ref 0–40)
Albumin: 4.2 g/dL (ref 3.5–4.7)
Alkaline Phosphatase: 53 IU/L (ref 39–117)
BUN/Creatinine Ratio: 10 (ref 10–24)
BUN: 11 mg/dL (ref 8–27)
Bilirubin Total: 0.3 mg/dL (ref 0.0–1.2)
CALCIUM: 9.5 mg/dL (ref 8.6–10.2)
CO2: 24 mmol/L (ref 20–29)
CREATININE: 1.15 mg/dL (ref 0.76–1.27)
Chloride: 102 mmol/L (ref 96–106)
GFR, EST AFRICAN AMERICAN: 68 mL/min/{1.73_m2} (ref >59)
GFR, EST NON AFRICAN AMERICAN: 59 mL/min/{1.73_m2} — AB (ref >59)
GLUCOSE: 197 mg/dL — AB (ref 65–99)
Globulin, Total: 3.3 g/dL (ref 1.5–4.5)
POTASSIUM: 4.4 mmol/L (ref 3.5–5.2)
Sodium: 142 mmol/L (ref 134–144)
TOTAL PROTEIN: 7.5 g/dL (ref 6.0–8.5)

## 2017-04-26 LAB — CBC WITH DIFFERENTIAL/PLATELET
BASOS: 0 %
Basophils Absolute: 0 10*3/uL (ref 0.0–0.2)
EOS (ABSOLUTE): 0.1 10*3/uL (ref 0.0–0.4)
Eos: 1 %
Hematocrit: 44 % (ref 37.5–51.0)
Hemoglobin: 14.6 g/dL (ref 13.0–17.7)
IMMATURE GRANS (ABS): 0 10*3/uL (ref 0.0–0.1)
IMMATURE GRANULOCYTES: 0 %
LYMPHS: 41 %
Lymphocytes Absolute: 2.5 10*3/uL (ref 0.7–3.1)
MCH: 27.4 pg (ref 26.6–33.0)
MCHC: 33.2 g/dL (ref 31.5–35.7)
MCV: 83 fL (ref 79–97)
MONOS ABS: 0.4 10*3/uL (ref 0.1–0.9)
Monocytes: 6 %
NEUTROS PCT: 52 %
Neutrophils Absolute: 3.2 10*3/uL (ref 1.4–7.0)
PLATELETS: 215 10*3/uL (ref 150–379)
RBC: 5.33 x10E6/uL (ref 4.14–5.80)
RDW: 14.5 % (ref 12.3–15.4)
WBC: 6.2 10*3/uL (ref 3.4–10.8)

## 2017-04-26 LAB — MICROALBUMIN / CREATININE URINE RATIO
CREATININE, UR: 230.1 mg/dL
Microalb/Creat Ratio: 6.4 mg/g creat (ref 0.0–30.0)
Microalbumin, Urine: 14.8 ug/mL

## 2017-04-26 LAB — URINALYSIS, ROUTINE W REFLEX MICROSCOPIC
BILIRUBIN UA: NEGATIVE
Glucose, UA: NEGATIVE
Ketones, UA: NEGATIVE
LEUKOCYTES UA: NEGATIVE
NITRITE UA: NEGATIVE
PH UA: 5 (ref 5.0–7.5)
Protein, UA: NEGATIVE
RBC UA: NEGATIVE
Specific Gravity, UA: 1.02 (ref 1.005–1.030)
Urobilinogen, Ur: 1 mg/dL (ref 0.2–1.0)

## 2017-04-26 LAB — TSH: TSH: 1.45 u[IU]/mL (ref 0.450–4.500)

## 2017-04-26 LAB — POCT GLYCOSYLATED HEMOGLOBIN (HGB A1C): Hemoglobin A1C: 6.6

## 2017-04-27 NOTE — Progress Notes (Signed)
Spoke to pt's caregiver and advised lab results.  Also mailing a prudent diet to pt.  dbs

## 2017-05-07 ENCOUNTER — Other Ambulatory Visit: Payer: Self-pay

## 2017-05-07 MED ORDER — KOMBIGLYZE XR 5-1000 MG PO TB24: 1.0000 | ORAL_TABLET | Freq: Every day | ORAL | 1 refills | Status: DC

## 2017-05-07 MED ORDER — GLIMEPIRIDE 2 MG PO TABS: 2.0000 mg | ORAL_TABLET | Freq: Every day | ORAL | 1 refills | Status: DC

## 2017-05-07 MED ORDER — PIOGLITAZONE HCL 45 MG PO TABS: 45.0000 mg | ORAL_TABLET | Freq: Every day | ORAL | 1 refills | Status: DC

## 2017-05-07 MED ORDER — AMLODIPINE BESYLATE 10 MG PO TABS: 10.0000 mg | ORAL_TABLET | Freq: Every day | ORAL | 1 refills | Status: DC

## 2017-06-25 ENCOUNTER — Telehealth: Payer: Self-pay

## 2017-06-25 NOTE — Telephone Encounter (Signed)
FAXED PT PAPERWRK TO [facility]. [phone](FAX [phone]) BR

## 2017-08-10 DIAGNOSIS — M79675 Pain in left toe(s): Secondary | ICD-10-CM | POA: Diagnosis not present

## 2017-08-10 DIAGNOSIS — M898X9 Other specified disorders of bone, unspecified site: Secondary | ICD-10-CM | POA: Diagnosis not present

## 2017-08-10 DIAGNOSIS — M79674 Pain in right toe(s): Secondary | ICD-10-CM | POA: Diagnosis not present

## 2017-08-10 DIAGNOSIS — E119 Type 2 diabetes mellitus without complications: Secondary | ICD-10-CM | POA: Diagnosis not present

## 2017-08-10 DIAGNOSIS — M2042 Other hammer toe(s) (acquired), left foot: Secondary | ICD-10-CM | POA: Diagnosis not present

## 2017-08-10 DIAGNOSIS — B351 Tinea unguium: Secondary | ICD-10-CM | POA: Diagnosis not present

## 2017-08-10 DIAGNOSIS — L600 Ingrowing nail: Secondary | ICD-10-CM | POA: Diagnosis not present

## 2017-08-23 ENCOUNTER — Ambulatory Visit: Payer: Self-pay | Admitting: Nurse Practitioner

## 2017-09-21 ENCOUNTER — Ambulatory Visit (INDEPENDENT_AMBULATORY_CARE_PROVIDER_SITE_OTHER): Payer: Medicare Other | Admitting: Nurse Practitioner

## 2017-09-21 VITALS — BP 157/80 | HR 71 | Resp 16 | Ht 60.0 in | Wt 188.0 lb

## 2017-09-21 DIAGNOSIS — I10 Essential (primary) hypertension: Secondary | ICD-10-CM

## 2017-09-21 DIAGNOSIS — F0391 Unspecified dementia with behavioral disturbance: Secondary | ICD-10-CM

## 2017-09-21 DIAGNOSIS — E1165 Type 2 diabetes mellitus with hyperglycemia: Secondary | ICD-10-CM | POA: Diagnosis not present

## 2017-09-21 DIAGNOSIS — B353 Tinea pedis: Secondary | ICD-10-CM | POA: Diagnosis not present

## 2017-09-21 DIAGNOSIS — E782 Mixed hyperlipidemia: Secondary | ICD-10-CM | POA: Diagnosis not present

## 2017-09-21 LAB — POCT GLYCOSYLATED HEMOGLOBIN (HGB A1C): Hemoglobin A1C: 6.6

## 2017-09-21 NOTE — Progress Notes (Signed)
[facility] [address] [place], [place] [address]  Internal MEDICINE  Office Visit Note  Patient Name: Tyler Jimenez  [id number]  [id number]  Date of Service: 10/17/2017    Pt is here for routine follow up.    Chief Complaint  Patient presents with  . Diabetes  . Hypertension    Diabetes  He presents for his follow-up diabetic visit. He has type 2 diabetes mellitus. His disease course has been stable. There are no hypoglycemic associated symptoms. Pertinent negatives for hypoglycemia include no nervousness/anxiousness or tremors. Associated symptoms include weakness. Pertinent negatives for diabetes include no chest pain and no fatigue. There are no hypoglycemic complications. Symptoms are stable. There are no diabetic complications. Risk factors for coronary artery disease include diabetes mellitus, dyslipidemia, hypertension and male sex. Current diabetic treatment includes oral agent (triple therapy). He is compliant with treatment most of the time. His weight is stable. He is following a generally healthy diet. Meal planning includes avoidance of concentrated sweets. He has not had a previous visit with a dietitian. He participates in exercise intermittently. There is no change in his home blood glucose trend. An ACE inhibitor/angiotensin II receptor blocker is not being taken. He sees a podiatrist.Eye exam is current.     Current Medication: Outpatient Encounter Medications as of 09/21/2017  Medication Sig  . amLODipine (NORVASC) 10 MG tablet Take 1 tablet (10 mg total) by mouth daily.  Marland Kitchen glimepiride (AMARYL) 2 MG tablet Take 1 tablet (2 mg total) by mouth daily.  Marland Kitchen KOMBIGLYZE XR 09-998 MG TB24 Take 1 tablet by mouth daily with supper.  . Multiple Vitamin (MULTI-VITAMINS) TABS Take by mouth.  . pioglitazone (ACTOS) 45 MG tablet Take 1 tablet (45 mg total) by mouth daily.   No facility-administered encounter medications on file as of 09/21/2017.     Surgical  History: Past Surgical History:  Procedure Laterality Date  . CATARACT EXTRACTION W/PHACO Right 01/05/2015   Procedure: CATARACT EXTRACTION PHACO AND INTRAOCULAR LENS PLACEMENT (IOC);  Surgeon: Galen Manila, MD;  Location: [facility] ORS;  Service: Ophthalmology;  Laterality: Right;  [place]: 02:33.9 AP%: 29.9 CDE: 46.04 Lot# [id number]  . CATARACT EXTRACTION W/PHACO Left 01/19/2015   Procedure: CATARACT EXTRACTION PHACO AND INTRAOCULAR LENS PLACEMENT (IOC);  Surgeon: Galen Manila, MD;  Location: [facility] ORS;  Service: Ophthalmology;  Laterality: Left;  [place]: 03:59.3 AP%: 28.9 CDE: 69.13 Lot # [id number]  . KNEE SURGERY Right     Medical History: Past Medical History:  Diagnosis Date  . Asthma    as a child  . Cataract   . Dementia   . Diabetes mellitus without complication (HCC)   . HOH (hard of hearing)   . Hypertension     Family History: Family History  Problem Relation Age of Onset  . Prostate cancer Father   . Bladder Cancer Neg Hx   . Kidney cancer Neg Hx     Social History   Socioeconomic History  . Marital status: Married    Spouse name: Not on file  . Number of children: Not on file  . Years of education: Not on file  . Highest education level: Not on file  Occupational History  . Not on file  Social Needs  . Financial resource strain: Not on file  . Food insecurity:    Worry: Not on file    Inability: Not on file  . Transportation needs:    Medical: Not on file    Non-medical: Not on file  Tobacco  Use  . Smoking status: Former Smoker  . Smokeless tobacco: Never Used  Substance and Sexual Activity  . Alcohol use: Yes    Alcohol/week: 0.0 oz    Comment: social  . Drug use: No  . Sexual activity: Not on file  Lifestyle  . Physical activity:    Days per week: Not on file    Minutes per session: Not on file  . Stress: Not on file  Relationships  . Social connections:    Talks on phone: Not on file    Gets together: Not on file    Attends religious  service: Not on file    Active member of club or organization: Not on file    Attends meetings of clubs or organizations: Not on file    Relationship status: Not on file  . Intimate partner violence:    Fear of current or ex partner: Not on file    Emotionally abused: Not on file    Physically abused: Not on file    Forced sexual activity: Not on file  Other Topics Concern  . Not on file  Social History Narrative  . Not on file      Review of Systems  Constitutional: Negative for activity change, chills, fatigue and unexpected weight change.  HENT: Negative for congestion, postnasal drip, rhinorrhea, sneezing and sore throat.   Eyes: Negative.  Negative for redness.  Respiratory: Negative for cough, chest tightness, shortness of breath and wheezing.   Cardiovascular: Negative for chest pain and palpitations.  Gastrointestinal: Negative for abdominal pain, constipation, diarrhea, nausea and vomiting.  Endocrine:       Blood sugars doing well   Genitourinary: Negative for dysuria, frequency and hematuria.  Musculoskeletal: Negative for arthralgias, back pain, joint swelling and neck pain.  Skin: Negative for rash.  Allergic/Immunologic: Negative for environmental allergies.  Neurological: Positive for weakness. Negative for tremors and numbness.       Memory issues.   Hematological: Negative for adenopathy. Does not bruise/bleed easily.  Psychiatric/Behavioral: Negative for behavioral problems (Depression), sleep disturbance and suicidal ideas. The patient is not nervous/anxious.     Today's Vitals   09/21/17 1538  BP: (!) 157/80  Pulse: 71  Resp: 16  SpO2: 95%  Weight: 188 lb (85.3 kg)  Height: 5' (1.524 m)    Physical Exam  Constitutional: He is oriented to person, place, and time. He appears well-developed and well-nourished. No distress.  HENT:  Head: Normocephalic and atraumatic.  Nose: Nose normal.  Mouth/Throat: Oropharynx is clear and moist. No oropharyngeal  exudate.  Eyes: Pupils are equal, round, and reactive to light. EOM are normal.  Neck: Normal range of motion. Neck supple. No JVD present. Carotid bruit is not present. No tracheal deviation present. No thyromegaly present.  Cardiovascular: Normal rate, regular rhythm and normal heart sounds. Exam reveals no gallop and no friction rub.  No murmur heard. Pulmonary/Chest: Effort normal and breath sounds normal. No respiratory distress. He has no wheezes. He has no rales. He exhibits no tenderness.  Abdominal: Soft. Bowel sounds are normal. There is no tenderness.  Musculoskeletal: Normal range of motion.  Lymphadenopathy:    He has no cervical adenopathy.  Neurological: He is alert and oriented to person, place, and time. No cranial nerve deficit.  Skin: Skin is warm and dry. He is not diaphoretic.  Psychiatric: He has a normal mood and affect. His behavior is normal. Judgment and thought content normal.  Nursing note and vitals reviewed.  Assessment/Plan:  1. Uncontrolled type 2 diabetes mellitus with hyperglycemia (HCC) - POCT HgB A1C 6.6 today. conitnue diabetic medications as prescribed. Refer to opthalmology for diabetic eye exam.  - Ambulatory referral to Ophthalmology  2. Essential hypertension Generally stable. Continue bp medication as prescribed.   3. Mixed hyperlipidemia Controlling through diet and physical activity.  4. Tinea pedis of both feet Improved. Now seeing podiatry  5. Dementia with behavioral disturbance, unspecified dementia type Stable. Will monitor closely.   General Counseling: Gyasi verbalizes understanding of the findings of todays visit and agrees with plan of treatment. I have discussed any further diagnostic evaluation that may be needed or ordered today. We also reviewed his medications today. he has been encouraged to call the office with any questions or concerns that should arise related to todays visit.    Counseling:  Diabetes Counseling:   1. Addition of ACE inh/ ARB'S for nephroprotection. 2. Diabetic foot care, prevention of complications.  3.Exercise and lose weight.  4. Diabetic eye examination, 5. Monitor blood sugar closlely. nutrition counseling.  6.Sign and symptoms of hypoglycemia including shaking sweating,confusion and headaches.   This patient was seen by Vincent Gros, FNP- C in Collaboration with Dr Lyndon Code as a part of collaborative care agreement  Orders Placed This Encounter  Procedures  . Ambulatory referral to Ophthalmology  . POCT HgB A1C     Time spent: 15 Minutes          Dr Lyndon Code Internal medicine

## 2017-09-27 DIAGNOSIS — L089 Local infection of the skin and subcutaneous tissue, unspecified: Secondary | ICD-10-CM | POA: Diagnosis not present

## 2017-09-27 DIAGNOSIS — L84 Corns and callosities: Secondary | ICD-10-CM | POA: Diagnosis not present

## 2017-10-02 DIAGNOSIS — M79675 Pain in left toe(s): Secondary | ICD-10-CM | POA: Diagnosis not present

## 2017-10-17 ENCOUNTER — Encounter: Payer: Self-pay | Admitting: Nurse Practitioner

## 2017-10-17 DIAGNOSIS — E1165 Type 2 diabetes mellitus with hyperglycemia: Secondary | ICD-10-CM | POA: Insufficient documentation

## 2017-10-17 DIAGNOSIS — B353 Tinea pedis: Secondary | ICD-10-CM | POA: Insufficient documentation

## 2017-11-21 ENCOUNTER — Other Ambulatory Visit: Payer: Self-pay

## 2017-11-21 MED ORDER — KOMBIGLYZE XR 5-1000 MG PO TB24: 1.0000 | ORAL_TABLET | Freq: Every day | ORAL | 1 refills | Status: DC

## 2017-11-26 ENCOUNTER — Other Ambulatory Visit: Payer: Self-pay | Admitting: Nurse Practitioner

## 2017-11-26 MED ORDER — GLIMEPIRIDE 2 MG PO TABS: 2.0000 mg | ORAL_TABLET | Freq: Every day | ORAL | 1 refills | Status: DC

## 2018-01-18 ENCOUNTER — Ambulatory Visit: Payer: Self-pay | Admitting: Adult Health

## 2018-02-01 ENCOUNTER — Encounter: Payer: Self-pay | Admitting: Adult Health

## 2018-02-01 ENCOUNTER — Other Ambulatory Visit: Payer: Self-pay | Admitting: Adult Health

## 2018-02-01 ENCOUNTER — Ambulatory Visit (INDEPENDENT_AMBULATORY_CARE_PROVIDER_SITE_OTHER): Payer: Medicare Other | Admitting: Adult Health

## 2018-02-01 VITALS — BP 128/78 | HR 73 | Resp 16 | Ht 64.0 in | Wt 188.2 lb

## 2018-02-01 DIAGNOSIS — E1165 Type 2 diabetes mellitus with hyperglycemia: Secondary | ICD-10-CM | POA: Diagnosis not present

## 2018-02-01 DIAGNOSIS — E782 Mixed hyperlipidemia: Secondary | ICD-10-CM | POA: Diagnosis not present

## 2018-02-01 DIAGNOSIS — I10 Essential (primary) hypertension: Secondary | ICD-10-CM

## 2018-02-01 DIAGNOSIS — Z23 Encounter for immunization: Secondary | ICD-10-CM | POA: Diagnosis not present

## 2018-02-01 LAB — POCT GLYCOSYLATED HEMOGLOBIN (HGB A1C): Hemoglobin A1C: 6.7 % — AB (ref 4.0–5.6)

## 2018-02-01 MED ORDER — PNEUMOCOCCAL VAC POLYVALENT 25 MCG/0.5ML IJ INJ: 0.5000 mL | INJECTION | INTRAMUSCULAR | 0 refills | Status: AC

## 2018-02-01 NOTE — Progress Notes (Signed)
[facility] [address] [place], [place] [address]  Internal MEDICINE  Office Visit Note  Patient Name: Tyler Jimenez  [id number]  [id number]  Date of Service: 02/01/2018  Chief Complaint  Patient presents with  . Diabetes  . Hypertension    HPI PT here for follow up on DM and HTN. He presents today with Son-in-law, He is doing well.  Denies complaints at this time. His a1c today is 6.7.  Last A1c was 6.6.  The patient lives with his daughter and son-in-law, and his diet and apetite is very good. His blood pressure is well controlled on Norvasc. He denies tobacco use.  He drinks whiskey occasionally.  Denies street drug use.   Current Medication: Outpatient Encounter Medications as of 02/01/2018  Medication Sig  . amLODipine (NORVASC) 10 MG tablet Take 1 tablet (10 mg total) by mouth daily.  Marland Kitchen glimepiride (AMARYL) 2 MG tablet Take 1 tablet (2 mg total) by mouth daily.  Marland Kitchen KOMBIGLYZE XR 09-998 MG TB24 Take 1 tablet by mouth daily with supper.  . Multiple Vitamin (MULTI-VITAMINS) TABS Take by mouth.  . pioglitazone (ACTOS) 45 MG tablet Take 1 tablet (45 mg total) by mouth daily.   No facility-administered encounter medications on file as of 02/01/2018.     Surgical History: Past Surgical History:  Procedure Laterality Date  . CATARACT EXTRACTION W/PHACO Right 01/05/2015   Procedure: CATARACT EXTRACTION PHACO AND INTRAOCULAR LENS PLACEMENT (IOC);  Surgeon: Galen Manila, MD;  Location: [facility] ORS;  Service: Ophthalmology;  Laterality: Right;  [place]: 02:33.9 AP%: 29.9 CDE: 46.04 Lot# [id number]  . CATARACT EXTRACTION W/PHACO Left 01/19/2015   Procedure: CATARACT EXTRACTION PHACO AND INTRAOCULAR LENS PLACEMENT (IOC);  Surgeon: Galen Manila, MD;  Location: [facility] ORS;  Service: Ophthalmology;  Laterality: Left;  [place]: 03:59.3 AP%: 28.9 CDE: 69.13 Lot # [id number]  . KNEE SURGERY Right     Medical History: Past Medical History:  Diagnosis Date  . Asthma    as a child   . Cataract   . Dementia   . Diabetes mellitus without complication (HCC)   . HOH (hard of hearing)   . Hypertension     Family History: Family History  Problem Relation Age of Onset  . Prostate cancer Father   . Bladder Cancer Neg Hx   . Kidney cancer Neg Hx     Social History   Socioeconomic History  . Marital status: Married    Spouse name: Not on file  . Number of children: Not on file  . Years of education: Not on file  . Highest education level: Not on file  Occupational History  . Not on file  Social Needs  . Financial resource strain: Not on file  . Food insecurity:    Worry: Not on file    Inability: Not on file  . Transportation needs:    Medical: Not on file    Non-medical: Not on file  Tobacco Use  . Smoking status: Former Games developer  . Smokeless tobacco: Never Used  Substance and Sexual Activity  . Alcohol use: Yes    Alcohol/week: 0.0 standard drinks    Comment: social  . Drug use: No  . Sexual activity: Not on file  Lifestyle  . Physical activity:    Days per week: Not on file    Minutes per session: Not on file  . Stress: Not on file  Relationships  . Social connections:    Talks on phone: Not on file  Gets together: Not on file    Attends religious service: Not on file    Active member of club or organization: Not on file    Attends meetings of clubs or organizations: Not on file    Relationship status: Not on file  . Intimate partner violence:    Fear of current or ex partner: Not on file    Emotionally abused: Not on file    Physically abused: Not on file    Forced sexual activity: Not on file  Other Topics Concern  . Not on file  Social History Narrative  . Not on file    Review of Systems  Constitutional: Negative.  Negative for chills, fatigue and unexpected weight change.  HENT: Negative.  Negative for congestion, rhinorrhea, sneezing and sore throat.   Eyes: Negative for redness.  Respiratory: Negative.  Negative for cough,  chest tightness and shortness of breath.   Cardiovascular: Negative.  Negative for chest pain and palpitations.  Gastrointestinal: Negative.  Negative for abdominal pain, constipation, diarrhea, nausea and vomiting.  Endocrine: Negative.   Genitourinary: Negative.  Negative for dysuria and frequency.  Musculoskeletal: Negative.  Negative for arthralgias, back pain, joint swelling and neck pain.  Skin: Negative.  Negative for rash.  Allergic/Immunologic: Negative.   Neurological: Negative.  Negative for tremors and numbness.  Hematological: Negative for adenopathy. Does not bruise/bleed easily.  Psychiatric/Behavioral: Negative.  Negative for behavioral problems, sleep disturbance and suicidal ideas. The patient is not nervous/anxious.     Vital Signs: BP 128/78   Pulse 73   Resp 16   Ht 5\' 4"  (1.626 m)   Wt 188 lb 3.2 oz (85.4 kg)   SpO2 95%   BMI 32.30 kg/m    Physical Exam  Constitutional: He is oriented to person, place, and time. He appears well-developed and well-nourished. No distress.  HENT:  Head: Normocephalic and atraumatic.  Mouth/Throat: Oropharynx is clear and moist. No oropharyngeal exudate.  Eyes: Pupils are equal, round, and reactive to light. EOM are normal.  Neck: Normal range of motion. Neck supple. No JVD present. No tracheal deviation present. No thyromegaly present.  Cardiovascular: Normal rate, regular rhythm and normal heart sounds. Exam reveals no gallop and no friction rub.  No murmur heard. Pulmonary/Chest: Effort normal and breath sounds normal. No respiratory distress. He has no wheezes. He has no rales. He exhibits no tenderness.  Abdominal: Soft. There is no tenderness. There is no guarding.  Musculoskeletal: Normal range of motion.  Lymphadenopathy:    He has no cervical adenopathy.  Neurological: He is alert and oriented to person, place, and time. No cranial nerve deficit.  Skin: Skin is warm and dry. He is not diaphoretic.  Psychiatric: He  has a normal mood and affect. His behavior is normal. Judgment and thought content normal.  Nursing note and vitals reviewed.  Assessment/Plan: 1. Uncontrolled type 2 diabetes mellitus with hyperglycemia (HCC) HgA1c today 6.7.  Doing well, continue on current regimen. - POCT HgB A1C  2. Essential hypertension Well controlled on Norvasc.  Continue current therapy.   3. Mixed hyperlipidemia Continue current therapy.  Will check lipid panel at yearly physcial  4. Flu vaccine need Declined flu shot today.   General Counseling: Abyan verbalizes understanding of the findings of todays visit and agrees with plan of treatment. I have discussed any further diagnostic evaluation that may be needed or ordered today. We also reviewed his medications today. he has been encouraged to call the office with any questions  or concerns that should arise related to todays visit.  Orders Placed This Encounter  Procedures  . POCT HgB A1C    Time spent: 25 Minutes   This patient was seen by Blima Ledger AGNP-C in Collaboration with Dr Lyndon Code as a part of collaborative care agreement    Dr Lyndon Code Internal medicine

## 2018-02-01 NOTE — Patient Instructions (Signed)
Diabetes Mellitus and Nutrition When you have diabetes (diabetes mellitus), it is very important to have healthy eating habits because your blood sugar (glucose) levels are greatly affected by what you eat and drink. Eating healthy foods in the appropriate amounts, at about the same times every day, can help you:  Control your blood glucose.  Lower your risk of heart disease.  Improve your blood pressure.  Reach or maintain a healthy weight.  Every person with diabetes is different, and each person has different needs for a meal plan. Your health care provider may recommend that you work with a diet and nutrition specialist (dietitian) to make a meal plan that is best for you. Your meal plan may vary depending on factors such as:  The calories you need.  The medicines you take.  Your weight.  Your blood glucose, blood pressure, and cholesterol levels.  Your activity level.  Other health conditions you have, such as heart or kidney disease.  How do carbohydrates affect me? Carbohydrates affect your blood glucose level more than any other type of food. Eating carbohydrates naturally increases the amount of glucose in your blood. Carbohydrate counting is a method for keeping track of how many carbohydrates you eat. Counting carbohydrates is important to keep your blood glucose at a healthy level, especially if you use insulin or take certain oral diabetes medicines. It is important to know how many carbohydrates you can safely have in each meal. This is different for every person. Your dietitian can help you calculate how many carbohydrates you should have at each meal and for snack. Foods that contain carbohydrates include:  Bread, cereal, rice, pasta, and crackers.  Potatoes and corn.  Peas, beans, and lentils.  Milk and yogurt.  Fruit and juice.  Desserts, such as cakes, cookies, ice cream, and candy.  How does alcohol affect me? Alcohol can cause a sudden decrease in blood  glucose (hypoglycemia), especially if you use insulin or take certain oral diabetes medicines. Hypoglycemia can be a life-threatening condition. Symptoms of hypoglycemia (sleepiness, dizziness, and confusion) are similar to symptoms of having too much alcohol. If your health care provider says that alcohol is safe for you, follow these guidelines:  Limit alcohol intake to no more than 1 drink per day for nonpregnant women and 2 drinks per day for men. One drink equals 12 oz of beer, 5 oz of wine, or 1 oz of hard liquor.  Do not drink on an empty stomach.  Keep yourself hydrated with water, diet soda, or unsweetened iced tea.  Keep in mind that regular soda, juice, and other mixers may contain a lot of sugar and must be counted as carbohydrates.  What are tips for following this plan? Reading food labels  Start by checking the serving size on the label. The amount of calories, carbohydrates, fats, and other nutrients listed on the label are based on one serving of the food. Many foods contain more than one serving per package.  Check the total grams (g) of carbohydrates in one serving. You can calculate the number of servings of carbohydrates in one serving by dividing the total carbohydrates by 15. For example, if a food has 30 g of total carbohydrates, it would be equal to 2 servings of carbohydrates.  Check the number of grams (g) of saturated and trans fats in one serving. Choose foods that have low or no amount of these fats.  Check the number of milligrams (mg) of sodium in one serving. Most people   should limit total sodium intake to less than 2,300 mg per day.  Always check the nutrition information of foods labeled as "low-fat" or "nonfat". These foods may be higher in added sugar or refined carbohydrates and should be avoided.  Talk to your dietitian to identify your daily goals for nutrients listed on the label. Shopping  Avoid buying canned, premade, or processed foods. These  foods tend to be high in fat, sodium, and added sugar.  Shop around the outside edge of the grocery store. This includes fresh fruits and vegetables, bulk grains, fresh meats, and fresh dairy. Cooking  Use low-heat cooking methods, such as baking, instead of high-heat cooking methods like deep frying.  Cook using healthy oils, such as olive, canola, or sunflower oil.  Avoid cooking with butter, cream, or high-fat meats. Meal planning  Eat meals and snacks regularly, preferably at the same times every day. Avoid going long periods of time without eating.  Eat foods high in fiber, such as fresh fruits, vegetables, beans, and whole grains. Talk to your dietitian about how many servings of carbohydrates you can eat at each meal.  Eat 4-6 ounces of lean protein each day, such as lean meat, chicken, fish, eggs, or tofu. 1 ounce is equal to 1 ounce of meat, chicken, or fish, 1 egg, or 1/4 cup of tofu.  Eat some foods each day that contain healthy fats, such as avocado, nuts, seeds, and fish. Lifestyle   Check your blood glucose regularly.  Exercise at least 30 minutes 5 or more days each week, or as told by your health care provider.  Take medicines as told by your health care provider.  Do not use any products that contain nicotine or tobacco, such as cigarettes and e-cigarettes. If you need help quitting, ask your health care provider.  Work with a counselor or diabetes educator to identify strategies to manage stress and any emotional and social challenges. What are some questions to ask my health care provider?  Do I need to meet with a diabetes educator?  Do I need to meet with a dietitian?  What number can I call if I have questions?  When are the best times to check my blood glucose? Where to find more information:  American Diabetes Association: diabetes.org/food-and-fitness/food  Academy of Nutrition and Dietetics:  www.eatright.org/resources/health/diseases-and-conditions/diabetes  National Institute of Diabetes and Digestive and Kidney Diseases (NIH): www.niddk.nih.gov/health-information/diabetes/overview/diet-eating-physical-activity Summary  A healthy meal plan will help you control your blood glucose and maintain a healthy lifestyle.  Working with a diet and nutrition specialist (dietitian) can help you make a meal plan that is best for you.  Keep in mind that carbohydrates and alcohol have immediate effects on your blood glucose levels. It is important to count carbohydrates and to use alcohol carefully. This information is not intended to replace advice given to you by your health care provider. Make sure you discuss any questions you have with your health care provider. Document Released: 01/19/2005 Document Revised: 05/29/2016 Document Reviewed: 05/29/2016 Elsevier Interactive Patient Education  2018 Elsevier Inc.  

## 2018-02-08 ENCOUNTER — Ambulatory Visit (INDEPENDENT_AMBULATORY_CARE_PROVIDER_SITE_OTHER): Payer: Medicare Other | Admitting: Adult Health

## 2018-02-08 ENCOUNTER — Encounter: Payer: Self-pay | Admitting: Adult Health

## 2018-02-08 ENCOUNTER — Telehealth: Payer: Self-pay | Admitting: Adult Health

## 2018-02-08 ENCOUNTER — Ambulatory Visit
Admission: RE | Admit: 2018-02-08 | Discharge: 2018-02-08 | Disposition: A | Discharge status: Home / Self Care | Payer: Medicare Other | Source: Ambulatory Visit | Attending: Adult Health | Admitting: Adult Health

## 2018-02-08 ENCOUNTER — Other Ambulatory Visit: Payer: Self-pay | Admitting: Adult Health

## 2018-02-08 VITALS — BP 146/78 | HR 67 | Temp 96.6°F | Resp 16 | Ht 69.0 in | Wt 186.4 lb

## 2018-02-08 DIAGNOSIS — S79912A Unspecified injury of left hip, initial encounter: Secondary | ICD-10-CM | POA: Diagnosis not present

## 2018-02-08 DIAGNOSIS — S79911A Unspecified injury of right hip, initial encounter: Secondary | ICD-10-CM | POA: Diagnosis not present

## 2018-02-08 DIAGNOSIS — M5136 Other intervertebral disc degeneration, lumbar region: Secondary | ICD-10-CM | POA: Insufficient documentation

## 2018-02-08 DIAGNOSIS — S3992XA Unspecified injury of lower back, initial encounter: Secondary | ICD-10-CM | POA: Diagnosis not present

## 2018-02-08 DIAGNOSIS — I10 Essential (primary) hypertension: Secondary | ICD-10-CM

## 2018-02-08 DIAGNOSIS — M25551 Pain in right hip: Secondary | ICD-10-CM | POA: Insufficient documentation

## 2018-02-08 DIAGNOSIS — F0391 Unspecified dementia with behavioral disturbance: Secondary | ICD-10-CM | POA: Diagnosis not present

## 2018-02-08 DIAGNOSIS — W19XXXA Unspecified fall, initial encounter: Secondary | ICD-10-CM

## 2018-02-08 DIAGNOSIS — M545 Low back pain: Secondary | ICD-10-CM | POA: Diagnosis not present

## 2018-02-08 MED ORDER — PREDNISONE 10 MG PO TABS: ORAL_TABLET | ORAL | 0 refills | Status: DC

## 2018-02-08 NOTE — Progress Notes (Addendum)
[facility] [address] [place], [place] [address]  Internal MEDICINE  Office Visit Note  Patient Name: Tyler Jimenez  [id number]  [id number]  Date of Service: 02/13/2018  Chief Complaint  Patient presents with  . Hip Pain    right side hip radiates down the leg , started last week on monday or tuesday ,Larey Seat last week    HPI Pt is here for a sick visit.  Pt reports right side hip pain that is radiating from the hip down his leg.  It does not go beyond the knee.  He denies any pain elsewhere.  He describes the pain as a burning pain.  It is affecting his gait. He reports it does not hurt at night when he lays down.  This morning it is not hurting like it has been.  His daughter is concerned because intermittently when its hurting he looks like he may fall down. The patient reports a fall recently    Current Medication:  Outpatient Encounter Medications as of 02/08/2018  Medication Sig  . amLODipine (NORVASC) 10 MG tablet Take 1 tablet (10 mg total) by mouth daily.  Marland Kitchen glimepiride (AMARYL) 2 MG tablet Take 1 tablet (2 mg total) by mouth daily.  Marland Kitchen KOMBIGLYZE XR 09-998 MG TB24 Take 1 tablet by mouth daily with supper.  . Multiple Vitamin (MULTI-VITAMINS) TABS Take by mouth.  . pioglitazone (ACTOS) 45 MG tablet Take 1 tablet (45 mg total) by mouth daily.  . predniSONE (DELTASONE) 10 MG tablet Use per dose pack   No facility-administered encounter medications on file as of 02/08/2018.     Medical History: Past Medical History:  Diagnosis Date  . Asthma    as a child  . Cataract   . Dementia (HCC)   . Diabetes mellitus without complication (HCC)   . HOH (hard of hearing)   . Hypertension    Vital Signs: BP (!) 146/78   Pulse 67   Temp (!) 96.6 F (35.9 C)   Resp 16   Ht 5\' 9"  (1.753 m)   Wt 186 lb 6.4 oz (84.6 kg)   SpO2 98%   BMI 27.53 kg/m   Review of Systems  Constitutional: Negative.  Negative for chills, fatigue and unexpected weight change.   HENT: Negative.  Negative for congestion, rhinorrhea, sneezing and sore throat.   Eyes: Negative for redness.  Respiratory: Negative.  Negative for cough, chest tightness and shortness of breath.   Cardiovascular: Negative.  Negative for chest pain and palpitations.  Gastrointestinal: Negative.  Negative for abdominal pain, constipation, diarrhea, nausea and vomiting.  Endocrine: Negative.   Genitourinary: Negative.  Negative for dysuria and frequency.  Musculoskeletal: Negative.  Negative for arthralgias, back pain, joint swelling and neck pain.       Right hip pain, radiating to knee  Skin: Negative.  Negative for rash.  Allergic/Immunologic: Negative.   Neurological: Negative.  Negative for tremors and numbness.  Hematological: Negative for adenopathy. Does not bruise/bleed easily.  Psychiatric/Behavioral: Negative.  Negative for behavioral problems, sleep disturbance and suicidal ideas. The patient is not nervous/anxious.     Physical Exam  Constitutional: He is oriented to person, place, and time. He appears well-developed and well-nourished. No distress.  HENT:  Head: Normocephalic and atraumatic.  Mouth/Throat: Oropharynx is clear and moist. No oropharyngeal exudate.  Eyes: Pupils are equal, round, and reactive to light. EOM are normal.  Neck: Normal range of motion. Neck supple. No JVD present. No tracheal deviation present. No thyromegaly  present.  Cardiovascular: Normal rate, regular rhythm and normal heart sounds. Exam reveals no gallop and no friction rub.  No murmur heard. Pulmonary/Chest: Effort normal and breath sounds normal. No respiratory distress. He has no wheezes. He has no rales. He exhibits no tenderness.  Abdominal: Soft. There is no tenderness. There is no guarding.  Musculoskeletal: Normal range of motion.  Mild pain associated with straight leg raise bilaterally.  Lymphadenopathy:    He has no cervical adenopathy.  Neurological: He is alert and oriented to  person, place, and time. No cranial nerve deficit.  Skin: Skin is warm and dry. He is not diaphoretic.  Psychiatric: He has a normal mood and affect. His behavior is normal. Judgment and thought content normal.  Nursing note and vitals reviewed.   Assessment/Plan: 1. Fall, initial encounter Pt fell, undetermined days ago. Will evaluate x-rays.   - DG Lumbar Spine Complete; Future - DG HIPS BILAT W OR W/O PELVIS 2V; Future  2. Right hip pain Will X-ray.  Most likely sciatica pain. Daughter would like to rule out fracture.   Will send RX for prednisone taper for them to use if they decide to.   3. Dementia with behavioral disturbance, unspecified dementia type (HCC) Pt is poor historian due to disease process.   4. Essential hypertension Elevated today.  Likely due to pain.   General Counseling: Ediel verbalizes understanding of the findings of todays visit and agrees with plan of treatment. I have discussed any further diagnostic evaluation that may be needed or ordered today. We also reviewed his medications today. he has been encouraged to call the office with any questions or concerns that should arise related to todays visit.  Orders Placed This Encounter  Procedures  . DG Lumbar Spine Complete    Meds ordered this encounter  Medications  . predniSONE (DELTASONE) 10 MG tablet    Sig: Use per dose pack    Dispense:  21 tablet    Refill:  0    Time spent: 20 Minutes  This patient was seen by Blima Ledger AGNP-C in Collaboration with Dr Lyndon Code as a part of collaborative care agreement

## 2018-02-08 NOTE — Patient Instructions (Signed)

## 2018-02-08 NOTE — Telephone Encounter (Signed)
Spoke to [place] regarding results of xray and medication.

## 2018-02-15 ENCOUNTER — Other Ambulatory Visit: Payer: Self-pay

## 2018-02-15 DIAGNOSIS — E113291 Type 2 diabetes mellitus with mild nonproliferative diabetic retinopathy without macular edema, right eye: Secondary | ICD-10-CM | POA: Diagnosis not present

## 2018-02-15 MED ORDER — PIOGLITAZONE HCL 45 MG PO TABS: 45.0000 mg | ORAL_TABLET | Freq: Every day | ORAL | 1 refills | Status: DC

## 2018-04-29 ENCOUNTER — Encounter: Payer: Self-pay | Admitting: Nurse Practitioner

## 2018-04-29 ENCOUNTER — Ambulatory Visit (INDEPENDENT_AMBULATORY_CARE_PROVIDER_SITE_OTHER): Payer: Medicare Other | Admitting: Nurse Practitioner

## 2018-04-29 VITALS — BP 137/82 | HR 88 | Resp 16 | Ht 63.0 in | Wt 185.6 lb

## 2018-04-29 DIAGNOSIS — R300 Dysuria: Secondary | ICD-10-CM | POA: Diagnosis not present

## 2018-04-29 DIAGNOSIS — E119 Type 2 diabetes mellitus without complications: Secondary | ICD-10-CM | POA: Diagnosis not present

## 2018-04-29 DIAGNOSIS — I10 Essential (primary) hypertension: Secondary | ICD-10-CM

## 2018-04-29 DIAGNOSIS — F0391 Unspecified dementia with behavioral disturbance: Secondary | ICD-10-CM

## 2018-04-29 DIAGNOSIS — E113299 Type 2 diabetes mellitus with mild nonproliferative diabetic retinopathy without macular edema, unspecified eye: Secondary | ICD-10-CM

## 2018-04-29 DIAGNOSIS — Z0001 Encounter for general adult medical examination with abnormal findings: Secondary | ICD-10-CM | POA: Insufficient documentation

## 2018-04-29 NOTE — Progress Notes (Signed)
[facility] [address] [place], [place] [address]  Internal MEDICINE  Office Visit Note  Patient Name: Tyler Jimenez  [id number]  [id number]  Date of Service: 04/29/2018    Pt is here for routine health maintenance examination   Chief Complaint  Patient presents with  . Medicare Wellness    well visit  . Hypertension  . Diabetes     The patient is here for routine health maintenance exam. Blood sugars are stable. Had diabetic eye exam 02/2018. Was found to have visual loss in left eye. 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 Mr. Tapp was unaware that there was visual loss. He has been prescribed new glasses to help preserve the vision in the right eye. He also has background diabetic retinopathy which requires no treatment at this time. Diabetic foot exam done per podiatry in May, 2019. He is to gollow up with them at least every six months. He does not wish to get a flu shot this year. He has no concerns or complaints today.   Current Medication: Outpatient Encounter Medications as of 04/29/2018  Medication Sig  . amLODipine (NORVASC) 10 MG tablet Take 1 tablet (10 mg total) by mouth daily.  Marland Kitchen glimepiride (AMARYL) 2 MG tablet Take 1 tablet (2 mg total) by mouth daily.  Marland Kitchen KOMBIGLYZE XR 09-998 MG TB24 Take 1 tablet by mouth daily with supper.  . Multiple Vitamin (MULTI-VITAMINS) TABS Take by mouth.  . pioglitazone (ACTOS) 45 MG tablet Take 1 tablet (45 mg total) by mouth daily.  . predniSONE (DELTASONE) 10 MG tablet Use per dose pack   No facility-administered encounter medications on file as of 04/29/2018.     Surgical History: Past Surgical History:  Procedure Laterality Date  . CATARACT EXTRACTION W/PHACO Right 01/05/2015   Procedure: CATARACT EXTRACTION PHACO AND INTRAOCULAR LENS PLACEMENT (IOC);  Surgeon: Galen Manila, MD;  Location: [facility] ORS;  Service: Ophthalmology;  Laterality: Right;  [place]: 02:33.9 AP%: 29.9 CDE: 46.04 Lot#  [id number]  . CATARACT EXTRACTION W/PHACO Left 01/19/2015   Procedure: CATARACT EXTRACTION PHACO AND INTRAOCULAR LENS PLACEMENT (IOC);  Surgeon: Galen Manila, MD;  Location: [facility] ORS;  Service: Ophthalmology;  Laterality: Left;  [place]: 03:59.3 AP%: 28.9 CDE: 69.13 Lot # [id number]  . KNEE SURGERY Right     Medical History: Past Medical History:  Diagnosis Date  . Asthma    as a child  . Cataract   . Dementia (HCC)   . Diabetes mellitus without complication (HCC)   . HOH (hard of hearing)   . Hypertension     Family History: Family History  Problem Relation Age of Onset  . Prostate cancer Father   . Bladder Cancer Neg Hx   . Kidney cancer Neg Hx       Review of Systems  Constitutional: Negative for activity change, chills, fatigue and unexpected weight change.  HENT: Negative for congestion, postnasal drip, rhinorrhea, sneezing and sore throat.   Eyes: Positive for visual disturbance. Negative for redness.       Left eye. Is seeing [facility] for care.   Respiratory: Negative for cough, chest tightness, shortness of breath and wheezing.   Cardiovascular: Negative for chest pain and palpitations.  Gastrointestinal: Negative for abdominal pain, constipation, diarrhea, nausea and vomiting.  Endocrine: Negative for cold intolerance, heat intolerance, polydipsia and polyuria.       Blood sugars doing well   Genitourinary: Negative for dysuria, frequency and hematuria.  Musculoskeletal: Negative for arthralgias, back  pain, joint swelling and neck pain.  Skin: Negative for rash.  Allergic/Immunologic: Negative for environmental allergies.  Neurological: Positive for weakness. Negative for tremors and numbness.       Memory issues.   Hematological: Negative for adenopathy. Does not bruise/bleed easily.  Psychiatric/Behavioral: Negative for behavioral problems (Depression), sleep disturbance and suicidal ideas. The patient is not nervous/anxious.      Today's Vitals    04/29/18 1525  BP: 137/82  Pulse: 88  Resp: 16  SpO2: 97%  Weight: 185 lb 9.6 oz (84.2 kg)  Height: 5\' 3"  (1.6 m)    Physical Exam Vitals signs and nursing note reviewed.  Constitutional:      General: He is not in acute distress.    Appearance: Normal appearance. He is well-developed. He is not diaphoretic.  HENT:     Head: Normocephalic and atraumatic.     Nose: Nose normal.     Mouth/Throat:     Pharynx: No oropharyngeal exudate.  Eyes:     Extraocular Movements: Extraocular movements intact.     Pupils: Pupils are equal, round, and reactive to light.  Neck:     Musculoskeletal: Normal range of motion and neck supple.     Thyroid: No thyromegaly.     Vascular: No carotid bruit or JVD.     Trachea: No tracheal deviation.  Cardiovascular:     Rate and Rhythm: Normal rate and regular rhythm.     Pulses: Normal pulses.     Heart sounds: Normal heart sounds. No murmur. No friction rub. No gallop.   Pulmonary:     Effort: Pulmonary effort is normal. No respiratory distress.     Breath sounds: Normal breath sounds. No wheezing or rales.  Chest:     Chest wall: No tenderness.  Abdominal:     General: Bowel sounds are normal.     Palpations: Abdomen is soft.     Tenderness: There is no abdominal tenderness.  Musculoskeletal: Normal range of motion.  Lymphadenopathy:     Cervical: No cervical adenopathy.  Skin:    General: Skin is warm and dry.  Neurological:     Mental Status: He is alert and oriented to person, place, and time. Mental status is at baseline.     Cranial Nerves: No cranial nerve deficit.  Psychiatric:        Mood and Affect: Mood normal.        Behavior: Behavior normal.        Thought Content: Thought content normal.        Judgment: Judgment normal.    Depression screen [facility] 2/9 04/29/2018 02/08/2018 02/01/2018 09/21/2017 04/25/2017  Decreased Interest 0 0 0 0 0  Down, Depressed, Hopeless 0 0 0 0 0  PHQ - 2 Score 0 0 0 0 0    Functional Status  Survey: Is the patient deaf or have difficulty hearing?: No Does the patient have difficulty seeing, even when wearing glasses/contacts?: No Does the patient have difficulty concentrating, remembering, or making decisions?: No Does the patient have difficulty walking or climbing stairs?: No Does the patient have difficulty dressing or bathing?: No Does the patient have difficulty doing errands alone such as visiting a doctor's office or shopping?: No  No flowsheet data found.  Fall Risk  04/29/2018 02/08/2018 02/01/2018 09/21/2017 04/25/2017  Falls in the past year? 0 Yes No Yes Yes  Number falls in past yr: - 1 - 2 or more 2 or more  Injury with Fall? - Yes -  Yes No  Risk Factor Category  - High Fall Risk - - -  Follow up - - - - Falls evaluation completed      LABS: Recent Results (from the past 2160 hour(s))  POCT HgB A1C     Status: Abnormal   Collection Time: 02/01/18  4:00 PM  Result Value Ref Range   Hemoglobin A1C 6.7 (A) 4.0 - 5.6 %   HbA1c POC (<> result, manual entry)     HbA1c, POC (prediabetic range)     HbA1c, POC (controlled diabetic range)     Assessment/Plan: 1. Encounter for health maintenance examination with abnormal findings Annual health maintenance exam today.   2. 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 Continue diabetic medications as prescribed .  3. Dementia with behavioral disturbance, unspecified dementia type (HCC) Stable. Continue to monitor closely.   4. Essential hypertension Stable. Continue bp medications as prescribed   5. Dysuria - UA/M w/rflx Culture, Routine  General Counseling: Clary verbalizes understanding of the findings of todays visit and agrees with plan of treatment. I have discussed any further diagnostic evaluation that may be needed or ordered today. We also reviewed his  medications today. he has been encouraged to call the office with any questions or concerns that should arise related to todays visit.    Counseling:  Diabetes Counseling:  1. Addition of ACE inh/ ARB'S for nephroprotection. Microalbumin is updated  2. Diabetic foot care, prevention of complications. Podiatry consult 3. Exercise and lose weight.  4. Diabetic eye examination, Diabetic eye exam is updated  5. Monitor blood sugar closlely. nutrition counseling.  6. Sign and symptoms of hypoglycemia including shaking sweating,confusion and headaches.  This patient was seen by Vincent GrosHeather Agam Davenport FNP Collaboration with Dr Lyndon CodeFozia M Khan as a part of collaborative care agreement  Orders Placed This Encounter  Procedures  . UA/M w/rflx Culture, Routine  . POCT HgB A1C     Time spent: 1430 Minutes      Lyndon CodeFozia M Khan, MD  Internal Medicine

## 2018-04-30 LAB — MICROSCOPIC EXAMINATION
BACTERIA UA: NONE SEEN
RBC, UA: NONE SEEN /hpf (ref 0–2)

## 2018-04-30 LAB — UA/M W/RFLX CULTURE, ROUTINE
BILIRUBIN UA: NEGATIVE
GLUCOSE, UA: NEGATIVE
KETONES UA: NEGATIVE
LEUKOCYTES UA: NEGATIVE
NITRITE UA: NEGATIVE
PROTEIN UA: NEGATIVE
RBC UA: NEGATIVE
Specific Gravity, UA: 1.013 (ref 1.005–1.030)
Urobilinogen, Ur: 1 mg/dL (ref 0.2–1.0)
pH, UA: 6 (ref 5.0–7.5)

## 2018-05-03 ENCOUNTER — Ambulatory Visit: Payer: Self-pay | Admitting: Adult Health

## 2018-05-03 LAB — POCT GLYCOSYLATED HEMOGLOBIN (HGB A1C): HEMOGLOBIN A1C: 6.9 % — AB (ref 4.0–5.6)

## 2018-06-04 ENCOUNTER — Other Ambulatory Visit: Payer: Self-pay

## 2018-06-04 MED ORDER — AMLODIPINE BESYLATE 10 MG PO TABS: 10.0000 mg | ORAL_TABLET | Freq: Every day | ORAL | 1 refills | Status: DC

## 2018-06-05 ENCOUNTER — Other Ambulatory Visit: Payer: Self-pay

## 2018-06-05 MED ORDER — GLIMEPIRIDE 2 MG PO TABS: 2.0000 mg | ORAL_TABLET | Freq: Every day | ORAL | 1 refills | Status: DC

## 2018-06-14 ENCOUNTER — Other Ambulatory Visit: Payer: Self-pay

## 2018-06-14 MED ORDER — KOMBIGLYZE XR 5-1000 MG PO TB24: 1.0000 | ORAL_TABLET | Freq: Every day | ORAL | 1 refills | Status: DC

## 2018-06-21 ENCOUNTER — Ambulatory Visit (INDEPENDENT_AMBULATORY_CARE_PROVIDER_SITE_OTHER): Payer: Medicare Other | Admitting: Adult Health

## 2018-06-21 ENCOUNTER — Encounter: Payer: Self-pay | Admitting: Adult Health

## 2018-06-21 VITALS — BP 132/76 | HR 88 | Resp 16 | Ht 69.0 in | Wt 187.0 lb

## 2018-06-21 DIAGNOSIS — I10 Essential (primary) hypertension: Secondary | ICD-10-CM

## 2018-06-21 DIAGNOSIS — Z0289 Encounter for other administrative examinations: Secondary | ICD-10-CM

## 2018-06-21 DIAGNOSIS — F0391 Unspecified dementia with behavioral disturbance: Secondary | ICD-10-CM

## 2018-06-21 DIAGNOSIS — E1165 Type 2 diabetes mellitus with hyperglycemia: Secondary | ICD-10-CM | POA: Diagnosis not present

## 2018-06-21 NOTE — Patient Instructions (Signed)

## 2018-06-21 NOTE — Progress Notes (Signed)
[facility] [address] [place], [place] [address]  Internal MEDICINE  Office Visit Note  Patient Name: Tyler Jimenez  [id number]  [id number]  Date of Service: 08/07/2018  Chief Complaint  Patient presents with  . Annual Exam    follow up from his last visit , paperwork was needed and did not have it filled out while he was here last     HPI Pt is here for follow up physical.  His Son in law brings him here to have some paperwork completed for [facility], so that the patient can continue to go to adult day care.  He is pleasant and cooperative.  Denies any issues at this time.  Denies any recent falls, or behavioral disturbances.     Current Medication: Outpatient Encounter Medications as of 06/21/2018  Medication Sig  . amLODipine (NORVASC) 10 MG tablet Take 1 tablet (10 mg total) by mouth daily.  Marland Kitchen glimepiride (AMARYL) 2 MG tablet Take 1 tablet (2 mg total) by mouth daily.  Marland Kitchen KOMBIGLYZE XR 09-998 MG TB24 Take 1 tablet by mouth daily with supper.  . Multiple Vitamin (MULTI-VITAMINS) TABS Take by mouth.  . pioglitazone (ACTOS) 45 MG tablet Take 1 tablet (45 mg total) by mouth daily.  . [DISCONTINUED] predniSONE (DELTASONE) 10 MG tablet Use per dose pack (Patient not taking: Reported on 06/21/2018)   No facility-administered encounter medications on file as of 06/21/2018.     Surgical History: Past Surgical History:  Procedure Laterality Date  . CATARACT EXTRACTION W/PHACO Right 01/05/2015   Procedure: CATARACT EXTRACTION PHACO AND INTRAOCULAR LENS PLACEMENT (IOC);  Surgeon: Galen Manila, MD;  Location: [facility] ORS;  Service: Ophthalmology;  Laterality: Right;  [place]: 02:33.9 AP%: 29.9 CDE: 46.04 Lot# [id number]  . CATARACT EXTRACTION W/PHACO Left 01/19/2015   Procedure: CATARACT EXTRACTION PHACO AND INTRAOCULAR LENS PLACEMENT (IOC);  Surgeon: Galen Manila, MD;  Location: [facility] ORS;  Service: Ophthalmology;  Laterality: Left;  [place]: 03:59.3 AP%: 28.9 CDE:  69.13 Lot # [id number]  . KNEE SURGERY Right     Medical History: Past Medical History:  Diagnosis Date  . Asthma    as a child  . Cataract   . Dementia (HCC)   . Diabetes mellitus without complication (HCC)   . HOH (hard of hearing)   . Hypertension     Family History: Family History  Problem Relation Age of Onset  . Prostate cancer Father   . Bladder Cancer Neg Hx   . Kidney cancer Neg Hx     Social History   Socioeconomic History  . Marital status: Married    Spouse name: Not on file  . Number of children: Not on file  . Years of education: Not on file  . Highest education level: Not on file  Occupational History  . Not on file  Social Needs  . Financial resource strain: Not on file  . Food insecurity:    Worry: Not on file    Inability: Not on file  . Transportation needs:    Medical: Not on file    Non-medical: Not on file  Tobacco Use  . Smoking status: Former Games developer  . Smokeless tobacco: Never Used  Substance and Sexual Activity  . Alcohol use: Yes    Alcohol/week: 0.0 standard drinks    Comment: social  . Drug use: No  . Sexual activity: Not on file  Lifestyle  . Physical activity:    Days per week: Not on file    Minutes per  session: Not on file  . Stress: Not on file  Relationships  . Social connections:    Talks on phone: Not on file    Gets together: Not on file    Attends religious service: Not on file    Active member of club or organization: Not on file    Attends meetings of clubs or organizations: Not on file    Relationship status: Not on file  . Intimate partner violence:    Fear of current or ex partner: Not on file    Emotionally abused: Not on file    Physically abused: Not on file    Forced sexual activity: Not on file  Other Topics Concern  . Not on file  Social History Narrative  . Not on file      Review of Systems  Constitutional: Negative.  Negative for chills, fatigue and unexpected weight change.  HENT:  Negative.  Negative for congestion, rhinorrhea, sneezing and sore throat.   Eyes: Negative for redness.  Respiratory: Negative.  Negative for cough, chest tightness and shortness of breath.   Cardiovascular: Negative.  Negative for chest pain and palpitations.  Gastrointestinal: Negative.  Negative for abdominal pain, constipation, diarrhea, nausea and vomiting.  Endocrine: Negative.   Genitourinary: Negative.  Negative for dysuria and frequency.  Musculoskeletal: Negative.  Negative for arthralgias, back pain, joint swelling and neck pain.  Skin: Negative.  Negative for rash.  Allergic/Immunologic: Negative.   Neurological: Negative.  Negative for tremors and numbness.  Hematological: Negative for adenopathy. Does not bruise/bleed easily.  Psychiatric/Behavioral: Negative.  Negative for behavioral problems, sleep disturbance and suicidal ideas. The patient is not nervous/anxious.     Vital Signs: BP 132/76 (BP Location: Left Arm, Patient Position: Sitting, Cuff Size: Normal)   Pulse 88   Resp 16   Ht 5\' 9"  (1.753 m)   Wt 187 lb (84.8 kg)   SpO2 99%   BMI 27.62 kg/m    Physical Exam Vitals signs and nursing note reviewed.  Constitutional:      General: He is not in acute distress.    Appearance: He is well-developed. He is not diaphoretic.  HENT:     Head: Normocephalic and atraumatic.     Mouth/Throat:     Pharynx: No oropharyngeal exudate.  Eyes:     Pupils: Pupils are equal, round, and reactive to light.  Neck:     Musculoskeletal: Normal range of motion and neck supple.     Thyroid: No thyromegaly.     Vascular: No JVD.     Trachea: No tracheal deviation.  Cardiovascular:     Rate and Rhythm: Normal rate and regular rhythm.     Heart sounds: Normal heart sounds. No murmur. No friction rub. No gallop.   Pulmonary:     Effort: Pulmonary effort is normal. No respiratory distress.     Breath sounds: Normal breath sounds. No wheezing or rales.  Chest:     Chest wall:  No tenderness.  Abdominal:     Palpations: Abdomen is soft.     Tenderness: There is no abdominal tenderness. There is no guarding.  Musculoskeletal: Normal range of motion.  Lymphadenopathy:     Cervical: No cervical adenopathy.  Skin:    General: Skin is warm and dry.  Neurological:     Mental Status: He is alert and oriented to person, place, and time.     Cranial Nerves: No cranial nerve deficit.  Psychiatric:  Behavior: Behavior normal.        Thought Content: Thought content normal.        Judgment: Judgment normal.    Assessment/Plan: 1. Uncontrolled type 2 diabetes mellitus with hyperglycemia (HCC) Most recent hemoglobin A1c was 6.9.  Patient's blood sugars appear well controlled.  Denies any issues.  2. Encounter for completion of form with patient Please form filled out for patient for medical release for him to get back to delay care program.  3. Essential hypertension Stable, today's BP 32/76.  Continue present management.  4. Dementia with behavioral disturbance, unspecified dementia type [facility](HCC) Patient appears pleasant and cooperative.  He stays in adult daycare program during the day and lives with family members for his safety.  General Counseling: Izora GalaLorenzo verbalizes understanding of the findings of todays visit and agrees with plan of treatment. I have discussed any further diagnostic evaluation that may be needed or ordered today. We also reviewed his medications today. he has been encouraged to call the office with any questions or concerns that should arise related to todays visit.    No orders of the defined types were placed in this encounter.   No orders of the defined types were placed in this encounter.   Time spent: 25 Minutes   This patient was seen by Blima LedgerAdam Aamirah Salmi AGNP-C in Collaboration with Dr Lyndon CodeFozia M Khan as a part of collaborative care agreement     Johnna AcostaAdam J. Maymie Brunke AGNP-C Internal medicine

## 2018-08-07 ENCOUNTER — Encounter: Payer: Self-pay | Admitting: Adult Health

## 2018-08-23 ENCOUNTER — Ambulatory Visit: Payer: Medicare Other | Admitting: Adult Health

## 2018-08-30 ENCOUNTER — Ambulatory Visit: Payer: Self-pay | Admitting: Adult Health

## 2018-08-30 ENCOUNTER — Encounter: Payer: Self-pay | Admitting: Nurse Practitioner

## 2018-08-30 ENCOUNTER — Ambulatory Visit: Payer: Medicare Other | Admitting: Nurse Practitioner

## 2018-08-30 ENCOUNTER — Other Ambulatory Visit: Payer: Self-pay

## 2018-08-30 VITALS — BP 124/78 | HR 77 | Resp 16 | Ht 69.0 in | Wt 180.0 lb

## 2018-08-30 DIAGNOSIS — F0391 Unspecified dementia with behavioral disturbance: Secondary | ICD-10-CM

## 2018-08-30 DIAGNOSIS — E1165 Type 2 diabetes mellitus with hyperglycemia: Principal | ICD-10-CM

## 2018-08-30 DIAGNOSIS — W19XXXD Unspecified fall, subsequent encounter: Secondary | ICD-10-CM

## 2018-08-30 DIAGNOSIS — E119 Type 2 diabetes mellitus without complications: Secondary | ICD-10-CM

## 2018-08-30 DIAGNOSIS — I10 Essential (primary) hypertension: Secondary | ICD-10-CM

## 2018-08-30 LAB — POCT GLYCOSYLATED HEMOGLOBIN (HGB A1C): Hemoglobin A1C: 6.5 % — AB (ref 4.0–5.6)

## 2018-08-30 MED ORDER — PIOGLITAZONE HCL 45 MG PO TABS: 45.0000 mg | ORAL_TABLET | Freq: Every day | ORAL | 1 refills | Status: AC

## 2018-08-30 NOTE — Progress Notes (Signed)
[facility] [address] [place], [place] [address]  Internal MEDICINE  Office Visit Note  Patient Name: Tyler DanielsLorenzo Jimenez  [id number]  [id number]  Date of Service: 09/21/2018  Chief Complaint  Patient presents with  . Diabetes    4 month follow up   . Hypertension  . Numbness    had a fall two weeks ago , legs going numb as walking     The patient is here for routine follow up visit. He has had a few episodes of numbness in his feet and legs. One time this resulted in a fall. He has no he has pain or deformity. Does have some numbness in his feet, but skin is intact with no evidence of blisters or ulceration.   Diabetes  He presents for his follow-up diabetic visit. He has type 2 diabetes mellitus. His disease course has been stable. There are no hypoglycemic associated symptoms. Pertinent negatives for hypoglycemia include no nervousness/anxiousness or tremors. Associated symptoms include weakness. Pertinent negatives for diabetes include no chest pain, no fatigue, no polydipsia and no polyuria. There are no hypoglycemic complications. Symptoms are stable. There are no diabetic complications. Risk factors for coronary artery disease include diabetes mellitus, dyslipidemia, hypertension and male sex. Current diabetic treatment includes oral agent (triple therapy). He is compliant with treatment most of the time. His weight is stable. He is following a generally healthy diet. Meal planning includes avoidance of concentrated sweets. He has not had a previous visit with a dietitian. He participates in exercise intermittently. There is no change in his home blood glucose trend. An ACE inhibitor/angiotensin II receptor blocker is not being taken. He sees a podiatrist.Eye exam is current.       Current Medication: Outpatient Encounter Medications as of 08/30/2018  Medication Sig  . amLODipine (NORVASC) 10 MG tablet Take 1 tablet (10 mg total) by mouth daily.  Marland Kitchen. glimepiride  (AMARYL) 2 MG tablet Take 1 tablet (2 mg total) by mouth daily.  Marland Kitchen. KOMBIGLYZE XR 09-998 MG TB24 Take 1 tablet by mouth daily with supper.  . Multiple Vitamin (MULTI-VITAMINS) TABS Take by mouth.  . pioglitazone (ACTOS) 45 MG tablet Take 1 tablet (45 mg total) by mouth daily.  . [DISCONTINUED] pioglitazone (ACTOS) 45 MG tablet Take 1 tablet (45 mg total) by mouth daily.   No facility-administered encounter medications on file as of 08/30/2018.     Surgical History: Past Surgical History:  Procedure Laterality Date  . CATARACT EXTRACTION W/PHACO Right 01/05/2015   Procedure: CATARACT EXTRACTION PHACO AND INTRAOCULAR LENS PLACEMENT (IOC);  Surgeon: Galen ManilaWilliam Porfilio, MD;  Location: [facility] ORS;  Service: Ophthalmology;  Laterality: Right;  US: 02:33.9 AP%: 29.9 CDE: 46.04 Lot# [id number]  . CATARACT EXTRACTION W/PHACO Left 01/19/2015   Procedure: CATARACT EXTRACTION PHACO AND INTRAOCULAR LENS PLACEMENT (IOC);  Surgeon: Galen ManilaWilliam Porfilio, MD;  Location: [facility] ORS;  Service: Ophthalmology;  Laterality: Left;  US: 03:59.3 AP%: 28.9 CDE: 69.13 Lot # [id number]  . KNEE SURGERY Right     Medical History: Past Medical History:  Diagnosis Date  . Asthma    as a child  . Cataract   . Dementia (HCC)   . Diabetes mellitus without complication (HCC)   . HOH (hard of hearing)   . Hypertension     Family History: Family History  Problem Relation Age of Onset  . Prostate cancer Father   . Bladder Cancer Neg Hx   . Kidney cancer Neg Hx     Social History  Socioeconomic History  . Marital status: Married    Spouse name: Not on file  . Number of children: Not on file  . Years of education: Not on file  . Highest education level: Not on file  Occupational History  . Not on file  Social Needs  . Financial resource strain: Not on file  . Food insecurity:    Worry: Not on file    Inability: Not on file  . Transportation needs:    Medical: Not on file    Non-medical: Not on file  Tobacco Use   . Smoking status: Former Games developer  . Smokeless tobacco: Never Used  Substance and Sexual Activity  . Alcohol use: Yes    Alcohol/week: 0.0 standard drinks    Comment: social  . Drug use: No  . Sexual activity: Not on file  Lifestyle  . Physical activity:    Days per week: Not on file    Minutes per session: Not on file  . Stress: Not on file  Relationships  . Social connections:    Talks on phone: Not on file    Gets together: Not on file    Attends religious service: Not on file    Active member of club or organization: Not on file    Attends meetings of clubs or organizations: Not on file    Relationship status: Not on file  . Intimate partner violence:    Fear of current or ex partner: Not on file    Emotionally abused: Not on file    Physically abused: Not on file    Forced sexual activity: Not on file  Other Topics Concern  . Not on file  Social History Narrative  . Not on file      Review of Systems  Constitutional: Negative for activity change, chills, fatigue and unexpected weight change.  HENT: Negative for congestion, postnasal drip, rhinorrhea, sneezing and sore throat.   Eyes:       Declining vision in left eye. Is seeing [facility] for care.   Respiratory: Negative for cough, chest tightness, shortness of breath and wheezing.   Cardiovascular: Negative for chest pain and palpitations.  Gastrointestinal: Negative for abdominal pain, constipation, diarrhea, nausea and vomiting.  Endocrine: Negative for cold intolerance, heat intolerance, polydipsia and polyuria.       Blood sugars doing well   Musculoskeletal: Positive for gait problem. Negative for arthralgias, back pain, joint swelling and neck pain.  Skin: Negative for rash.  Allergic/Immunologic: Negative for environmental allergies.  Neurological: Positive for weakness and light-headedness. Negative for tremors and numbness.       Memory issues.   Hematological: Negative for adenopathy. Does  not bruise/bleed easily.  Psychiatric/Behavioral: Negative for behavioral problems (Depression), sleep disturbance and suicidal ideas. The patient is not nervous/anxious.     Today's Vitals   08/30/18 1533  BP: 124/78  Pulse: 77  Resp: 16  SpO2: 98%  Weight: 180 lb (81.6 kg)  Height:  (1.753 m)   Body mass index is 26.58 kg/m.  Physical Exam Vitals signs and nursing note reviewed.  Constitutional:      General: He is not in acute distress.    Appearance: Normal appearance. He is well-developed. He is not diaphoretic.  HENT:     Head: Normocephalic and atraumatic.     Nose: Nose normal.     Mouth/Throat:     Pharynx: No oropharyngeal exudate.  Eyes:     Extraocular Movements: Extraocular movements intact.  Pupils: Pupils are equal, round, and reactive to light.  Neck:     Musculoskeletal: Normal range of motion and neck supple.     Thyroid: No thyromegaly.     Vascular: No carotid bruit or JVD.     Trachea: No tracheal deviation.  Cardiovascular:     Rate and Rhythm: Normal rate and regular rhythm.     Pulses: Normal pulses.     Heart sounds: Normal heart sounds. No murmur. No friction rub. No gallop.   Pulmonary:     Effort: Pulmonary effort is normal. No respiratory distress.     Breath sounds: Normal breath sounds. No wheezing or rales.  Chest:     Chest wall: No tenderness.  Abdominal:     General: Bowel sounds are normal.     Palpations: Abdomen is soft.     Tenderness: There is no abdominal tenderness.  Musculoskeletal: Normal range of motion.  Lymphadenopathy:     Cervical: No cervical adenopathy.  Skin:    General: Skin is warm and dry.  Neurological:     Mental Status: He is alert and oriented to person, place, and time. Mental status is at baseline.     Cranial Nerves: No cranial nerve deficit.  Psychiatric:        Mood and Affect: Mood normal.        Behavior: Behavior normal.        Thought Content: Thought content normal.        Judgment:  Judgment normal.    Assessment/Plan: 1. Controlled type 2 diabetes mellitus without complication, without long-term current use of insulin (HCC) - POCT HgB A1C 6.5 today. Continue diabetic medication as prescribed  - pioglitazone (ACTOS) 45 MG tablet; Take 1 tablet (45 mg total) by mouth daily.  Dispense: 90 tablet; Refill: 1  2. Essential hypertension Stable. Continue bp medication as prescribed   3. Dementia with behavioral disturbance, unspecified dementia type (HCC) Stable.   4. Fall, subsequent encounter No evidence of deformity or inflammation. Skin intact with no evidence of lesions or ulcerations. Discussed safety and fall prevention with the patient;s son-in-law.   General Counseling: Sully verbalizes understanding of the findings of todays visit and agrees with plan of treatment. I have discussed any further diagnostic evaluation that may be needed or ordered today. We also reviewed his medications today. he has been encouraged to call the office with any questions or concerns that should arise related to todays visit.  Diabetes Counseling:  1. Addition of ACE inh/ ARB'S for nephroprotection. Microalbumin is updated  2. Diabetic foot care, prevention of complications. Podiatry consult 3. Exercise and lose weight.  4. Diabetic eye examination, Diabetic eye exam is updated  5. Monitor blood sugar closlely. nutrition counseling.  6. Sign and symptoms of hypoglycemia including shaking sweating,confusion and headaches.  This patient was seen by Vincent Gros FNP Collaboration with Dr Lyndon Code as a part of collaborative care agreement  Orders Placed This Encounter  Procedures  . POCT HgB A1C    Meds ordered this encounter  Medications  . pioglitazone (ACTOS) 45 MG tablet    Sig: Take 1 tablet (45 mg total) by mouth daily.    Dispense:  90 tablet    Refill:  1    Order Specific Question:   Supervising Provider    Answer:   Lyndon Code [[id number]]    Time spent: 16  Minutes      Dr Lyndon Code Internal medicine

## 2018-09-19 ENCOUNTER — Other Ambulatory Visit: Payer: Self-pay

## 2018-09-19 MED ORDER — GLUCOSE BLOOD VI STRP: 1.0000 | ORAL_STRIP | Freq: Two times a day (BID) | 3 refills | Status: DC

## 2018-09-21 DIAGNOSIS — W19XXXA Unspecified fall, initial encounter: Secondary | ICD-10-CM | POA: Insufficient documentation

## 2018-12-16 ENCOUNTER — Other Ambulatory Visit: Payer: Self-pay | Admitting: Nurse Practitioner

## 2018-12-16 MED ORDER — AMLODIPINE BESYLATE 10 MG PO TABS: 10.0000 mg | ORAL_TABLET | Freq: Every day | ORAL | 1 refills | Status: DC

## 2018-12-16 MED ORDER — GLIMEPIRIDE 2 MG PO TABS: 2.0000 mg | ORAL_TABLET | Freq: Every day | ORAL | 1 refills | Status: DC

## 2018-12-16 MED ORDER — KOMBIGLYZE XR 5-1000 MG PO TB24: 1.0000 | ORAL_TABLET | Freq: Every day | ORAL | 1 refills | Status: AC

## 2019-01-03 ENCOUNTER — Ambulatory Visit: Payer: Medicare Other | Admitting: Nurse Practitioner

## 2019-01-15 ENCOUNTER — Telehealth: Payer: Self-pay | Admitting: Nurse Practitioner

## 2019-01-15 ENCOUNTER — Other Ambulatory Visit: Payer: Self-pay | Admitting: Nurse Practitioner

## 2019-01-15 DIAGNOSIS — E119 Type 2 diabetes mellitus without complications: Secondary | ICD-10-CM

## 2019-01-15 MED ORDER — FREESTYLE LITE TEST VI STRP: 1.0000 | ORAL_STRIP | Freq: Two times a day (BID) | 3 refills | Status: AC

## 2019-01-15 NOTE — Telephone Encounter (Signed)
Filled glucose test strips for testing twice daily and sent to [facility].

## 2019-01-15 NOTE — Progress Notes (Signed)
Filled glucose test strips for testing twice daily and sent to [facility] [address].

## 2019-01-17 ENCOUNTER — Encounter: Payer: Self-pay | Admitting: Nurse Practitioner

## 2019-01-17 ENCOUNTER — Other Ambulatory Visit: Payer: Self-pay

## 2019-01-17 ENCOUNTER — Ambulatory Visit (INDEPENDENT_AMBULATORY_CARE_PROVIDER_SITE_OTHER): Payer: Medicare Other | Admitting: Nurse Practitioner

## 2019-01-17 VITALS — BP 121/77 | HR 94 | Resp 16 | Ht 69.0 in | Wt 171.0 lb

## 2019-01-17 DIAGNOSIS — N390 Urinary tract infection, site not specified: Secondary | ICD-10-CM

## 2019-01-17 DIAGNOSIS — F321 Major depressive disorder, single episode, moderate: Secondary | ICD-10-CM

## 2019-01-17 DIAGNOSIS — E1165 Type 2 diabetes mellitus with hyperglycemia: Secondary | ICD-10-CM

## 2019-01-17 DIAGNOSIS — R300 Dysuria: Secondary | ICD-10-CM

## 2019-01-17 DIAGNOSIS — R319 Hematuria, unspecified: Secondary | ICD-10-CM | POA: Diagnosis not present

## 2019-01-17 DIAGNOSIS — R630 Anorexia: Secondary | ICD-10-CM

## 2019-01-17 LAB — POCT URINALYSIS DIPSTICK
Bilirubin, UA: NEGATIVE
Glucose, UA: NEGATIVE
Leukocytes, UA: NEGATIVE
Nitrite, UA: NEGATIVE
Protein, UA: POSITIVE — AB
Spec Grav, UA: 1.02 (ref 1.010–1.025)
Urobilinogen, UA: 0.2 E.U./dL
pH, UA: 5 (ref 5.0–8.0)

## 2019-01-17 LAB — POCT GLYCOSYLATED HEMOGLOBIN (HGB A1C): Hemoglobin A1C: 5.9 % — AB (ref 4.0–5.6)

## 2019-01-17 MED ORDER — AMOXICILLIN-POT CLAVULANATE 875-125 MG PO TABS: 1.0000 | ORAL_TABLET | Freq: Two times a day (BID) | ORAL | 0 refills | Status: DC

## 2019-01-17 MED ORDER — MIRTAZAPINE 7.5 MG PO TABS: 7.5000 mg | ORAL_TABLET | Freq: Every day | ORAL | 3 refills | Status: DC

## 2019-01-17 NOTE — Progress Notes (Signed)
[facility], [place] [address]  Internal MEDICINE  Office Visit Note  Patient Name: Tyler Jimenez  [id number]  [id number]  Date of Service: 01/26/2019  Chief Complaint  Patient presents with  . Diabetes  . Hyperlipidemia  . Dementia    pt had an episode earlier this week but is doing fine now    The patient is here for routine follow up. His blood sugars are well controlled. His HgbA1c is 5.9 today. Blood pressure is also well controlled. He did have episode earlier in the week, where he had gotten out of his home and was wondering around his neighborhood. He was combative. He has had episodes like this before. Has been diagnosed with alzheimer's dementia in the past. Generally, it is well managed. With spread of COVID 19, the patient's routine has been severely interrupted. He is not going to day program every day. This may be contributing to his worsening dementia.       Current Medication: Outpatient Encounter Medications as of 01/17/2019  Medication Sig  . amLODipine (NORVASC) 10 MG tablet Take 1 tablet (10 mg total) by mouth daily.  Marland Kitchen glimepiride (AMARYL) 2 MG tablet Take 1 tablet (2 mg total) by mouth daily.  Marland Kitchen glucose blood (FREESTYLE LITE) test strip 1 each by Other route 2 (two) times daily. diag e11.65  . KOMBIGLYZE XR 09-998 MG TB24 Take 1 tablet by mouth daily with supper.  . Multiple Vitamin (MULTI-VITAMINS) TABS Take by mouth.  . pioglitazone (ACTOS) 45 MG tablet Take 1 tablet (45 mg total) by mouth daily.  Marland Kitchen amoxicillin-clavulanate (AUGMENTIN) 875-125 MG tablet Take 1 tablet by mouth 2 (two) times daily.  . mirtazapine (REMERON) 7.5 MG tablet Take 1 tablet (7.5 mg total) by mouth at bedtime.   No facility-administered encounter medications on file as of 01/17/2019.     Surgical History: Past Surgical History:  Procedure Laterality Date  . CATARACT EXTRACTION W/PHACO Right 01/05/2015   Procedure: CATARACT EXTRACTION PHACO AND  INTRAOCULAR LENS PLACEMENT (IOC);  Surgeon: Birder Robson, MD;  Location: [facility] ORS;  Service: Ophthalmology;  Laterality: Right;  [place]: 02:33.9 AP%: 29.9 CDE: 46.04 Lot# [id number]  . CATARACT EXTRACTION W/PHACO Left 01/19/2015   Procedure: CATARACT EXTRACTION PHACO AND INTRAOCULAR LENS PLACEMENT (IOC);  Surgeon: Birder Robson, MD;  Location: [facility] ORS;  Service: Ophthalmology;  Laterality: Left;  [place]: 03:59.3 AP%: 28.9 CDE: 69.13 Lot # [id number]  . KNEE SURGERY Right     Medical History: Past Medical History:  Diagnosis Date  . Asthma    as a child  . Cataract   . Dementia ([place])   . Diabetes mellitus without complication ([place])   . HOH (hard of hearing)   . Hypertension     Family History: Family History  Problem Relation Age of Onset  . Prostate cancer Father   . Bladder Cancer Neg Hx   . Kidney cancer Neg Hx     Social History   Socioeconomic History  . Marital status: Married    Spouse name: Not on file  . Number of children: Not on file  . Years of education: Not on file  . Highest education level: Not on file  Occupational History  . Not on file  Social Needs  . Financial resource strain: Not on file  . Food insecurity    Worry: Not on file    Inability: Not on file  . Transportation needs    Medical: Not on file  Non-medical: Not on file  Tobacco Use  . Smoking status: Former Games developermoker  . Smokeless tobacco: Never Used  Substance and Sexual Activity  . Alcohol use: Yes    Alcohol/week: 0.0 standard drinks    Comment: ocassionally  . Drug use: No  . Sexual activity: Not on file  Lifestyle  . Physical activity    Days per week: Not on file    Minutes per session: Not on file  . Stress: Not on file  Relationships  . Social Musicianconnections    Talks on phone: Not on file    Gets together: Not on file    Attends religious service: Not on file    Active member of club or organization: Not on file    Attends meetings of clubs or organizations: Not on file     Relationship status: Not on file  . Intimate partner violence    Fear of current or ex partner: Not on file    Emotionally abused: Not on file    Physically abused: Not on file    Forced sexual activity: Not on file  Other Topics Concern  . Not on file  Social History Narrative  . Not on file      Review of Systems  Constitutional: Negative for chills, fatigue and unexpected weight change.  HENT: Negative for congestion, postnasal drip, rhinorrhea, sneezing and sore throat.   Respiratory: Negative for cough, chest tightness, shortness of breath and wheezing.   Cardiovascular: Negative for chest pain and palpitations.  Gastrointestinal: Negative for abdominal pain, constipation, diarrhea, nausea and vomiting.  Endocrine: Negative for cold intolerance, heat intolerance, polydipsia and polyuria.       Blood sugars doing well   Genitourinary: Positive for frequency.  Musculoskeletal: Negative for arthralgias, back pain, joint swelling and neck pain.  Skin: Negative for rash.  Allergic/Immunologic: Negative for environmental allergies.  Neurological: Positive for weakness. Negative for tremors and numbness.       Memory issues with worsening dementia.   Hematological: Negative for adenopathy. Does not bruise/bleed easily.  Psychiatric/Behavioral: Negative for behavioral problems (Depression), sleep disturbance and suicidal ideas. The patient is not nervous/anxious.     Today's Vitals   01/17/19 1534  BP: 121/77  Pulse: 94  Resp: 16  SpO2: 95%  Weight: 171 lb (77.6 kg)  Height: 5\' 9"  (1.753 m)   Body mass index is 25.25 kg/m.  Physical Exam Vitals signs and nursing note reviewed.  Constitutional:      General: He is not in acute distress.    Appearance: Normal appearance. He is well-developed. He is not diaphoretic.  HENT:     Head: Normocephalic and atraumatic.     Nose: Nose normal.     Mouth/Throat:     Pharynx: No oropharyngeal exudate.  Eyes:     Extraocular  Movements: Extraocular movements intact.     Pupils: Pupils are equal, round, and reactive to light.  Neck:     Musculoskeletal: Normal range of motion and neck supple.     Thyroid: No thyromegaly.     Vascular: No carotid bruit or JVD.     Trachea: No tracheal deviation.  Cardiovascular:     Rate and Rhythm: Normal rate and regular rhythm.     Pulses: Normal pulses.     Heart sounds: Normal heart sounds. No murmur. No friction rub. No gallop.   Pulmonary:     Effort: Pulmonary effort is normal. No respiratory distress.     Breath sounds: Normal  breath sounds. No wheezing or rales.  Chest:     Chest wall: No tenderness.  Abdominal:     Palpations: Abdomen is soft.     Tenderness: There is no abdominal tenderness.  Genitourinary:    Comments: Urine sample is positive for ketones, protein, and trace blood.  Musculoskeletal: Normal range of motion.  Lymphadenopathy:     Cervical: No cervical adenopathy.  Skin:    General: Skin is warm and dry.  Neurological:     Mental Status: He is alert and oriented to person, place, and time. Mental status is at baseline.     Cranial Nerves: No cranial nerve deficit.  Psychiatric:        Mood and Affect: Mood is depressed.        Behavior: Behavior normal.        Thought Content: Thought content normal.        Judgment: Judgment normal.   Assessment/Plan: 1. Type 2 diabetes mellitus with hyperglycemia, unspecified whether long term insulin use (HCC) - POCT HgB A1C 5.9 today. Continue diabetic medication as prescribed   2. Urinary tract infection with hematuria, site unspecified Mild infection suspected. Start augmentin 875mg  bid for 7 days. Send urine for culture and sensitivity and adjust antibiotics as indicated.  - CULTURE, URINE COMPREHENSIVE - amoxicillin-clavulanate (AUGMENTIN) 875-125 MG tablet; Take 1 tablet by mouth 2 (two) times daily.  Dispense: 14 tablet; Refill: 0  3. Loss of appetite Start remeron 7.5mg  every evening. Will  reassess at next visit.  - mirtazapine (REMERON) 7.5 MG tablet; Take 1 tablet (7.5 mg total) by mouth at bedtime.  Dispense: 30 tablet; Refill: 3  4. Current moderate episode of major depressive disorder without prior episode (HCC) Start remeron 7.5mg  every evening. Will reassess at next visit.   5. Dysuria - POCT Urinalysis Dipstick  General Counseling: Shraga verbalizes understanding of the findings of todays visit and agrees with plan of treatment. I have discussed any further diagnostic evaluation that may be needed or ordered today. We also reviewed his medications today. he has been encouraged to call the office with any questions or concerns that should arise related to todays visit.  This patient was seen by Vincent Gros FNP Collaboration with Dr Lyndon Code as a part of collaborative care agreement  Orders Placed This Encounter  Procedures  . CULTURE, URINE COMPREHENSIVE  . POCT HgB A1C  . POCT Urinalysis Dipstick    Meds ordered this encounter  Medications  . mirtazapine (REMERON) 7.5 MG tablet    Sig: Take 1 tablet (7.5 mg total) by mouth at bedtime.    Dispense:  30 tablet    Refill:  3    Order Specific Question:   Supervising Provider    Answer:   Lyndon Code [[id number]]  . amoxicillin-clavulanate (AUGMENTIN) 875-125 MG tablet    Sig: Take 1 tablet by mouth 2 (two) times daily.    Dispense:  14 tablet    Refill:  0    Order Specific Question:   Supervising Provider    Answer:   Lyndon Code [[id number]]    Time spent: 48 Minutes      Dr Lyndon Code Internal medicine

## 2019-01-20 LAB — CULTURE, URINE COMPREHENSIVE

## 2019-01-20 NOTE — Progress Notes (Signed)
Small infection with normal flora. Was started on augmentin at the time of his visit .

## 2019-01-26 DIAGNOSIS — F321 Major depressive disorder, single episode, moderate: Secondary | ICD-10-CM | POA: Insufficient documentation

## 2019-01-26 DIAGNOSIS — R630 Anorexia: Secondary | ICD-10-CM | POA: Insufficient documentation

## 2019-01-26 DIAGNOSIS — N390 Urinary tract infection, site not specified: Secondary | ICD-10-CM | POA: Insufficient documentation

## 2019-01-27 ENCOUNTER — Telehealth: Payer: Self-pay

## 2019-01-27 ENCOUNTER — Other Ambulatory Visit: Payer: Self-pay | Admitting: Nurse Practitioner

## 2019-01-27 DIAGNOSIS — R319 Hematuria, unspecified: Secondary | ICD-10-CM

## 2019-01-27 DIAGNOSIS — N390 Urinary tract infection, site not specified: Secondary | ICD-10-CM

## 2019-01-27 MED ORDER — SULFAMETHOXAZOLE-TRIMETHOPRIM 800-160 MG PO TABS: 1.0000 | ORAL_TABLET | Freq: Two times a day (BID) | ORAL | 0 refills | Status: DC

## 2019-01-27 NOTE — Progress Notes (Signed)
Patient finished antibiotics for UTI and symptoms returned after completion. Will do round bactrim DS twice daily for 10 days. New prescription sent to the pharmacy.

## 2019-01-27 NOTE — Telephone Encounter (Signed)
Will do round bactrim DS twice daily for 10 days. New prescription sent to the pharmacy.

## 2019-01-27 NOTE — Telephone Encounter (Signed)
Son in law was informed.

## 2019-02-04 ENCOUNTER — Telehealth: Payer: Self-pay

## 2019-02-04 ENCOUNTER — Encounter: Payer: Self-pay | Admitting: Emergency Medicine

## 2019-02-04 ENCOUNTER — Emergency Department
Admission: EM | Admit: 2019-02-04 | Discharge: 2019-02-04 | Disposition: A | Discharge status: Home / Self Care | Payer: Medicare Other | Attending: Emergency Medicine | Admitting: Emergency Medicine

## 2019-02-04 ENCOUNTER — Other Ambulatory Visit: Payer: Self-pay

## 2019-02-04 DIAGNOSIS — I10 Essential (primary) hypertension: Secondary | ICD-10-CM | POA: Insufficient documentation

## 2019-02-04 DIAGNOSIS — F0391 Unspecified dementia with behavioral disturbance: Secondary | ICD-10-CM | POA: Insufficient documentation

## 2019-02-04 DIAGNOSIS — J45909 Unspecified asthma, uncomplicated: Secondary | ICD-10-CM | POA: Diagnosis not present

## 2019-02-04 DIAGNOSIS — Z87891 Personal history of nicotine dependence: Secondary | ICD-10-CM | POA: Insufficient documentation

## 2019-02-04 DIAGNOSIS — E119 Type 2 diabetes mellitus without complications: Secondary | ICD-10-CM | POA: Insufficient documentation

## 2019-02-04 DIAGNOSIS — Z79899 Other long term (current) drug therapy: Secondary | ICD-10-CM | POA: Diagnosis not present

## 2019-02-04 DIAGNOSIS — Z20828 Contact with and (suspected) exposure to other viral communicable diseases: Secondary | ICD-10-CM | POA: Insufficient documentation

## 2019-02-04 DIAGNOSIS — R4182 Altered mental status, unspecified: Secondary | ICD-10-CM | POA: Diagnosis present

## 2019-02-04 DIAGNOSIS — Z03818 Encounter for observation for suspected exposure to other biological agents ruled out: Secondary | ICD-10-CM | POA: Diagnosis not present

## 2019-02-04 LAB — URINALYSIS, COMPLETE (UACMP) WITH MICROSCOPIC
Bacteria, UA: NONE SEEN
Bilirubin Urine: NEGATIVE
Glucose, UA: NEGATIVE mg/dL
Hgb urine dipstick: NEGATIVE
Ketones, ur: NEGATIVE mg/dL
Leukocytes,Ua: NEGATIVE
Nitrite: NEGATIVE
Protein, ur: NEGATIVE mg/dL
Specific Gravity, Urine: 1.012 (ref 1.005–1.030)
Squamous Epithelial / HPF: NONE SEEN (ref 0–5)
pH: 5 (ref 5.0–8.0)

## 2019-02-04 LAB — COMPREHENSIVE METABOLIC PANEL
ALT: 20 U/L (ref 0–44)
AST: 25 U/L (ref 15–41)
Albumin: 4.2 g/dL (ref 3.5–5.0)
Alkaline Phosphatase: 42 U/L (ref 38–126)
Anion gap: 10 (ref 5–15)
BUN: 12 mg/dL (ref 8–23)
CO2: 23 mmol/L (ref 22–32)
Calcium: 9.4 mg/dL (ref 8.9–10.3)
Chloride: 105 mmol/L (ref 98–111)
Creatinine, Ser: 1.37 mg/dL — ABNORMAL HIGH (ref 0.61–1.24)
GFR calc Af Amer: 54 mL/min — ABNORMAL LOW (ref >60)
GFR calc non Af Amer: 47 mL/min — ABNORMAL LOW (ref >60)
Glucose, Bld: 175 mg/dL — ABNORMAL HIGH (ref 70–99)
Potassium: 4.3 mmol/L (ref 3.5–5.1)
Sodium: 138 mmol/L (ref 135–145)
Total Bilirubin: 0.7 mg/dL (ref 0.3–1.2)
Total Protein: 7.4 g/dL (ref 6.5–8.1)

## 2019-02-04 LAB — URINE DRUG SCREEN, QUALITATIVE (ARMC ONLY)
Amphetamines, Ur Screen: NOT DETECTED
Barbiturates, Ur Screen: NOT DETECTED
Benzodiazepine, Ur Scrn: NOT DETECTED
Cannabinoid 50 Ng, Ur ~~LOC~~: NOT DETECTED
Cocaine Metabolite,Ur ~~LOC~~: NOT DETECTED
MDMA (Ecstasy)Ur Screen: NOT DETECTED
Methadone Scn, Ur: NOT DETECTED
Opiate, Ur Screen: NOT DETECTED
Phencyclidine (PCP) Ur S: NOT DETECTED
Tricyclic, Ur Screen: NOT DETECTED

## 2019-02-04 LAB — CBC
HCT: 41.5 % (ref 39.0–52.0)
Hemoglobin: 13.6 g/dL (ref 13.0–17.0)
MCH: 27.5 pg (ref 26.0–34.0)
MCHC: 32.8 g/dL (ref 30.0–36.0)
MCV: 83.8 fL (ref 80.0–100.0)
Platelets: 180 10*3/uL (ref 150–400)
RBC: 4.95 MIL/uL (ref 4.22–5.81)
RDW: 14.6 % (ref 11.5–15.5)
WBC: 6.4 10*3/uL (ref 4.0–10.5)
nRBC: 0 % (ref 0.0–0.2)

## 2019-02-04 LAB — SALICYLATE LEVEL: Salicylate Lvl: <7 mg/dL (ref 2.8–30.0)

## 2019-02-04 LAB — ACETAMINOPHEN LEVEL: Acetaminophen (Tylenol), Serum: <10 ug/mL — ABNORMAL LOW (ref 10–30)

## 2019-02-04 LAB — ETHANOL: Alcohol, Ethyl (B): <10 mg/dL (ref <10)

## 2019-02-04 NOTE — ED Triage Notes (Signed)
Pt with dimentia. Lives with family.  He has been tryingt o leave because he believes he lives somewhere else in another state.

## 2019-02-04 NOTE — NC FL2 (Signed)
[place] LEVEL OF CARE SCREENING TOOL     IDENTIFICATION  Patient Name: Tyler Jimenez Birthdate: 12-07-1931 Sex: male Admission Date (Current Location): 02/04/2019  [place] and [place] Number:  Engineering geologist and Address:  [facility], [address] [place], [place] [address]      Provider Number: [id number]  Attending Physician Name and Address:  Harvest Dark, MD  Relative Name and Phone Number:  Melodie Bouillon    Daughter     [phone]    Current Level of Care: Hospital Recommended Level of Care: Memory Care Prior Approval Number:    Date Approved/Denied:   PASRR Number:    Discharge Plan:      Current Diagnoses: Patient Active Problem List   Diagnosis Date Noted  . Urinary tract infection with hematuria 01/26/2019  . Loss of appetite 01/26/2019  . Current moderate episode of major depressive disorder without prior episode ([place]) 01/26/2019  . Fall 09/21/2018  . Encounter for health maintenance examination with abnormal findings 04/29/2018  . Dysuria 04/29/2018  . Uncontrolled type 2 diabetes mellitus with hyperglycemia ([place]) 10/17/2017  . Tinea pedis of both feet 10/17/2017  . Dementia with behavioral disturbance ([place]) 04/25/2017  . DM2 (diabetes mellitus, type 2) ([place]) 04/25/2017  . Essential hypertension 04/25/2017  . Cardiac murmur 04/25/2017  . Mixed hyperlipidemia 04/25/2017    Orientation RESPIRATION BLADDER Height & Weight        Normal Continent Weight: 175 lb (79.4 kg) Height:  5\' 6"  (167.6 cm)  BEHAVIORAL SYMPTOMS/MOOD NEUROLOGICAL BOWEL NUTRITION STATUS      Continent Diet  AMBULATORY STATUS COMMUNICATION OF NEEDS Skin   Independent Verbally Normal                       Personal Care Assistance Level of Assistance  Bathing, Feeding, Dressing Bathing Assistance: Limited assistance Feeding assistance: Independent Dressing Assistance: Independent     Functional Limitations  Info  Sight, Hearing, Speech Sight Info: Adequate Hearing Info: Adequate Speech Info: Adequate    SPECIAL CARE FACTORS FREQUENCY                       Contractures Contractures Info: Not present    Additional Factors Info  Allergies   Allergies Info: No known allergies           Current Medications (02/04/2019):  This is the current hospital active medication list No current facility-administered medications for this encounter.    Current Outpatient Medications  Medication Sig Dispense Refill  . amLODipine (NORVASC) 10 MG tablet Take 1 tablet (10 mg total) by mouth daily. 90 tablet 1  . amoxicillin-clavulanate (AUGMENTIN) 875-125 MG tablet Take 1 tablet by mouth 2 (two) times daily. 14 tablet 0  . glimepiride (AMARYL) 2 MG tablet Take 1 tablet (2 mg total) by mouth daily. 90 tablet 1  . glucose blood (FREESTYLE LITE) test strip 1 each by Other route 2 (two) times daily. diag e11.65 100 each 3  . KOMBIGLYZE XR 09-998 MG TB24 Take 1 tablet by mouth daily with supper. 90 tablet 1  . mirtazapine (REMERON) 7.5 MG tablet Take 1 tablet (7.5 mg total) by mouth at bedtime. 30 tablet 3  . Multiple Vitamin (MULTI-VITAMINS) TABS Take by mouth.    . pioglitazone (ACTOS) 45 MG tablet Take 1 tablet (45 mg total) by mouth daily. 90 tablet 1  . sulfamethoxazole-trimethoprim (BACTRIM DS) 800-160 MG tablet Take 1 tablet by mouth 2 (  two) times daily. 20 tablet 0     Discharge Medications: Please see discharge summary for a list of discharge medications.  Relevant Imaging Results:  Relevant Lab Results:   Additional Information SSN: [id number]  Tania Jajuan Skoog, LCSW

## 2019-02-04 NOTE — TOC Initial Note (Signed)
Transition of Care [facility]) - Initial/Assessment Note    Patient Details  Name: Tyler Jimenez MRN: [id number] Date of Birth: Mar 09, 1932  Transition of Care [facility]) CM/SW Contact:    Fredric Mare, LCSW Phone Number: 02/04/2019, 2:22 PM  Clinical Narrative:                  Patient is a 85 year old male that presents to the ED for altered mental status. CSW spoke with patient's son in law, Aaron Edelman, and patient's APS social worker, Juluis Pitch. EDP shared with CSW that patient has been suffering from dementia from the last three years, and he has been wandering more frequently at night, wanting to go to his grandmother's house.  Juluis Pitch shared that she is working on having patient placed in a LTC facility with a memory care unit. She is helping the patient obtain special assistance Medicaid, and will begin looking for facilities he can go to. Patient's son in law is aware of this plan. Aaron Edelman asked if ED can complete TB test, COVID test, and FL2. EDP agreed to do this to help patient be placed quickly.    Expected Discharge Plan: Home/Self Care(patient's social worker is working on long term care placement for him)     Patient Goals and CMS Choice        Expected Discharge Plan and Services Expected Discharge Plan: Home/Self Care(patient's social worker is working on long term care placement for him)   Discharge Planning Services: CM Consult                                          Prior Living Arrangements/Services   Lives with:: Adult Children(daughter and son in Sports coach) Patient language and need for interpreter reviewed:: Yes Do you feel safe going back to the place where you live?: Yes      Need for Family Participation in Patient Care: Yes (Comment) Care giver support system in place?: Yes (comment)   Criminal Activity/Legal Involvement Pertinent to Current Situation/Hospitalization: No - Comment as needed  Activities of Daily Living      Permission  Sought/Granted                  Emotional Assessment Appearance:: Appears stated age   Affect (typically observed): Calm   Alcohol / Substance Use: Other (comment)(former smoker) Psych Involvement: No (comment)  Admission diagnosis:  dementia Patient Active Problem List   Diagnosis Date Noted  . Urinary tract infection with hematuria 01/26/2019  . Loss of appetite 01/26/2019  . Current moderate episode of major depressive disorder without prior episode ([place]) 01/26/2019  . Fall 09/21/2018  . Encounter for health maintenance examination with abnormal findings 04/29/2018  . Dysuria 04/29/2018  . Uncontrolled type 2 diabetes mellitus with hyperglycemia ([place]) 10/17/2017  . Tinea pedis of both feet 10/17/2017  . Dementia with behavioral disturbance ([place]) 04/25/2017  . DM2 (diabetes mellitus, type 2) ([place]) 04/25/2017  . Essential hypertension 04/25/2017  . Cardiac murmur 04/25/2017  . Mixed hyperlipidemia 04/25/2017   PCP:  Lavera Guise, MD Pharmacy:   [facility] [address] [place] [address] [place] [address] Phone: [phone] Fax: [phone]     Social Determinants of Health (SDOH) Interventions    Readmission Risk Interventions No flowsheet data found.

## 2019-02-04 NOTE — Telephone Encounter (Signed)
Pt son in law called that pt left 2 times house today and he said he don't live here he don't remember anything as per heather advised him he need go to ED

## 2019-02-04 NOTE — ED Notes (Signed)
Spoke with patient's family regarding getting test results and next steps prior to placement. Family provided with number for ED as well as access code for mychart. Patient ambulatory to lobby with steady gait and NAD noted.

## 2019-02-04 NOTE — TOC Progression Note (Signed)
Transition of Care [facility]) - Progression Note    Patient Details  Name: Tyler Jimenez MRN: [id number] Date of Birth: 1931/12/19  Transition of Care [facility]) CM/SW Contact  Tania Demecia [place]way, LCSW Phone Number: 02/04/2019, 3:06 PM  Clinical Narrative:     CSW provided patient's son in law with copy of FL2.   Expected Discharge Plan: Home/Self Care(patient's social worker is working on long term care placement for him)    Expected Discharge Plan and Services Expected Discharge Plan: Home/Self Care(patient's social worker is working on long term care placement for him)   Discharge Planning Services: CM Consult                                           Social Determinants of Health (SDOH) Interventions    Readmission Risk Interventions No flowsheet data found.

## 2019-02-04 NOTE — ED Notes (Signed)
Social Work here to see patient; provided Triage Room 3 for privacy.

## 2019-02-04 NOTE — ED Provider Notes (Signed)
[facility] Emergency Department Provider Note  Time seen: 1:51 PM  I have reviewed the triage vital signs and the nursing notes.   HISTORY  Chief Complaint Altered Mental Status   HPI Tyler Jimenez is a 85 y.o. male with a past medical history of dementia, asthma, hypertension, diabetes, presents to the emergency department for worsening dementia symptoms.  According to the son-in-law patient has had fairly severe dementia over the past 3 years.  They have been hoping to keep the patient at home as long as possible however they state his symptoms have worsened over the past several weeks to the point where they do not believe it is safe.  States the patient will continuously try to leave the house because he does not believe that he lives there and he wants to go back to his own home or sometimes at his grandmother's home.  Son-in-law who is here with the patient states several times the patient has got up and left in the middle the night when the rest of the family is asleep.  Here patient is awake alert, he has no complaints at this time.  He is oriented to person only.  He is calm and cooperative however.   Past Medical History:  Diagnosis Date  . Asthma    as a child  . Cataract   . Dementia (HCC)   . Diabetes mellitus without complication (HCC)   . HOH (hard of hearing)   . Hypertension     Patient Active Problem List   Diagnosis Date Noted  . Urinary tract infection with hematuria 01/26/2019  . Loss of appetite 01/26/2019  . Current moderate episode of major depressive disorder without prior episode (HCC) 01/26/2019  . Fall 09/21/2018  . Encounter for health maintenance examination with abnormal findings 04/29/2018  . Dysuria 04/29/2018  . Uncontrolled type 2 diabetes mellitus with hyperglycemia (HCC) 10/17/2017  . Tinea pedis of both feet 10/17/2017  . Dementia with behavioral disturbance (HCC) 04/25/2017  . DM2 (diabetes mellitus, type 2) (HCC)  04/25/2017  . Essential hypertension 04/25/2017  . Cardiac murmur 04/25/2017  . Mixed hyperlipidemia 04/25/2017    Past Surgical History:  Procedure Laterality Date  . CATARACT EXTRACTION W/PHACO Right 01/05/2015   Procedure: CATARACT EXTRACTION PHACO AND INTRAOCULAR LENS PLACEMENT (IOC);  Surgeon: Galen ManilaWilliam Porfilio, MD;  Location: [facility] ORS;  Service: Ophthalmology;  Laterality: Right;  [place]: 02:33.9 AP%: 29.9 CDE: 46.04 Lot# [id number]  . CATARACT EXTRACTION W/PHACO Left 01/19/2015   Procedure: CATARACT EXTRACTION PHACO AND INTRAOCULAR LENS PLACEMENT (IOC);  Surgeon: Galen ManilaWilliam Porfilio, MD;  Location: [facility] ORS;  Service: Ophthalmology;  Laterality: Left;  US: 03:59.3 AP%: 28.9 CDE: 69.13 Lot # [id number]  . KNEE SURGERY Right     Prior to Admission medications   Medication Sig Start Date End Date Taking? Authorizing Provider  amLODipine (NORVASC) 10 MG tablet Take 1 tablet (10 mg total) by mouth daily. 12/16/18   Carlean JewsBoscia, Heather E, NP  amoxicillin-clavulanate (AUGMENTIN) 875-125 MG tablet Take 1 tablet by mouth 2 (two) times daily. 01/17/19   Carlean JewsBoscia, Heather E, NP  glimepiride (AMARYL) 2 MG tablet Take 1 tablet (2 mg total) by mouth daily. 12/16/18   Carlean JewsBoscia, Heather E, NP  glucose blood (FREESTYLE LITE) test strip 1 each by Other route 2 (two) times daily. diag e11.65 01/15/19   Carlean JewsBoscia, Heather E, NP  KOMBIGLYZE XR 09-998 MG TB24 Take 1 tablet by mouth daily with supper. 12/16/18   Carlean JewsBoscia, Heather E, NP  mirtazapine (  REMERON) 7.5 MG tablet Take 1 tablet (7.5 mg total) by mouth at bedtime. 01/17/19   Ronnell Freshwater, NP  Multiple Vitamin (MULTI-VITAMINS) TABS Take by mouth.    [provider]  pioglitazone (ACTOS) 45 MG tablet Take 1 tablet (45 mg total) by mouth daily. 08/30/18   Ronnell Freshwater, NP  sulfamethoxazole-trimethoprim (BACTRIM DS) 800-160 MG tablet Take 1 tablet by mouth 2 (two) times daily. 01/27/19   Ronnell Freshwater, NP    No Known Allergies  Family History  Problem  Relation Age of Onset  . Prostate cancer Father   . Bladder Cancer Neg Hx   . Kidney cancer Neg Hx     Social History Social History   Tobacco Use  . Smoking status: Former Research scientist (life sciences)  . Smokeless tobacco: Never Used  Substance Use Topics  . Alcohol use: Yes    Alcohol/week: 0.0 standard drinks    Comment: ocassionally  . Drug use: No    Review of Systems Unable to obtain an adequate/accurate review of systems secondary to baseline dementia  ____________________________________________   PHYSICAL EXAM:  VITAL SIGNS: ED Triage Vitals  Enc Vitals Group     BP 02/04/19 1011 (!) 172/84     Pulse Rate 02/04/19 1011 (!) 112     Resp --      Temp 02/04/19 1011 99.1 F (37.3 C)     Temp Source 02/04/19 1011 Oral     SpO2 02/04/19 1011 94 %     Weight 02/04/19 1012 175 lb (79.4 kg)     Height 02/04/19 1012 5\' 6"  (1.676 m)     Head Circumference --      Peak Flow --      Pain Score --      Pain Loc --      Pain Edu? --      Excl. in [place]? --    Constitutional: Alert and oriented. Well appearing and in no distress. Eyes: Normal exam ENT      Head: Normocephalic and atraumatic.      Mouth/Throat: Mucous membranes are moist. Cardiovascular: Normal rate, regular rhythm.  Respiratory: Normal respiratory effort without tachypnea nor retractions. Breath sounds are clear Gastrointestinal: Soft and nontender. No distention. Musculoskeletal: Nontender with normal range of motion in all extremities. Neurologic:  Normal speech and language. No gross focal neurologic deficits Skin:  Skin is warm, dry and intact.  Psychiatric: Mood and affect are normal.   ____________________________________________   INITIAL IMPRESSION / ASSESSMENT AND PLAN / ED COURSE  Pertinent labs & imaging results that were available during my care of the patient were reviewed by me and considered in my medical decision making (see chart for details).   Patient presents emergency department with family who  are concerned over the patient's increased dementia symptoms.  They state the patient continues to try to leave the house as he does not believe that he lives there, is now leaving in the middle the night once the family falls asleep.  Son is not sure that they can adequately care for the patient at home any longer.  We will check labs to ensure no metabolic or electrolyte abnormality, urinary tract infection, etc.  Patient's lab work is largely within normal limits.  We will consult social work to evaluate our options including possible placement.  Son-in-law states they have been working with a Education officer, museum at home, we will attempt to contact them as well to again explore options.  Patient's medical work-up  is essentially negative.  Social worker is going to help try to arrange placement for the patient from home.  Son is agreeable to take the patient home.  TB test and corona test was performed in the emergency department.  Quinto Tippy was evaluated in Emergency Department on 02/04/2019 for the symptoms described in the history of present illness. He was evaluated in the context of the global COVID-19 pandemic, which necessitated consideration that the patient might be at risk for infection with the SARS-CoV-2 virus that causes COVID-19. Institutional protocols and algorithms that pertain to the evaluation of patients at risk for COVID-19 are in a state of rapid change based on information released by regulatory bodies including the CDC and federal and state organizations. These policies and algorithms were followed during the patient's care in the ED.  ____________________________________________   FINAL CLINICAL IMPRESSION(S) / ED DIAGNOSES  Dementia   Minna Antis, MD 02/04/19 1527

## 2019-02-05 LAB — SARS CORONAVIRUS 2 (TAT 6-24 HRS): SARS Coronavirus 2: NEGATIVE

## 2019-02-07 LAB — QUANTIFERON-TB GOLD PLUS (RQFGPL)
QuantiFERON Mitogen Value: >10 IU/mL
QuantiFERON Nil Value: 0.02 IU/mL
QuantiFERON TB1 Ag Value: 0.02 IU/mL
QuantiFERON TB2 Ag Value: 0.02 IU/mL

## 2019-02-07 LAB — QUANTIFERON-TB GOLD PLUS: QuantiFERON-TB Gold Plus: NEGATIVE

## 2019-02-18 ENCOUNTER — Encounter: Payer: Self-pay | Admitting: Nurse Practitioner

## 2019-02-18 ENCOUNTER — Encounter: Payer: Self-pay | Admitting: Internal Medicine

## 2019-02-18 NOTE — Telephone Encounter (Signed)
Do we need to get another Fl2 for this? Why would they leave off dementia?

## 2019-02-18 NOTE — Telephone Encounter (Signed)
What should we do about this? Dementia is missing and that is the main reason he needs admission to care facility

## 2019-02-19 NOTE — Social Work (Addendum)
Post Discharge Note 02/19/2019:    Patient's son in law, Hermine Messick, attempting to receive FL2 so that patient can be placed in ALF. CSW encouraged him to contact medical records to obtain this document.     Joanna, [place] ED  [phone]

## 2019-02-24 DIAGNOSIS — E119 Type 2 diabetes mellitus without complications: Secondary | ICD-10-CM | POA: Diagnosis not present

## 2019-02-24 DIAGNOSIS — J45909 Unspecified asthma, uncomplicated: Secondary | ICD-10-CM | POA: Diagnosis not present

## 2019-02-24 DIAGNOSIS — Z87440 Personal history of urinary (tract) infections: Secondary | ICD-10-CM | POA: Diagnosis not present

## 2019-02-24 DIAGNOSIS — I10 Essential (primary) hypertension: Secondary | ICD-10-CM | POA: Diagnosis not present

## 2019-02-24 DIAGNOSIS — G301 Alzheimer's disease with late onset: Secondary | ICD-10-CM | POA: Diagnosis not present

## 2019-03-24 DIAGNOSIS — R630 Anorexia: Secondary | ICD-10-CM | POA: Diagnosis not present

## 2019-03-24 DIAGNOSIS — E119 Type 2 diabetes mellitus without complications: Secondary | ICD-10-CM | POA: Diagnosis not present

## 2019-03-24 DIAGNOSIS — U071 COVID-19: Secondary | ICD-10-CM | POA: Diagnosis not present

## 2019-03-24 DIAGNOSIS — G301 Alzheimer's disease with late onset: Secondary | ICD-10-CM | POA: Diagnosis not present

## 2019-03-24 DIAGNOSIS — I10 Essential (primary) hypertension: Secondary | ICD-10-CM | POA: Diagnosis not present

## 2019-03-25 ENCOUNTER — Inpatient Hospital Stay (HOSPITAL_COMMUNITY)
Admission: EM | Admit: 2019-03-25 | Discharge: 2019-04-01 | DRG: 177 | Disposition: A | Discharge status: Skilled Nursing Facility | Payer: Medicare Other | Source: Skilled Nursing Facility | Attending: Family Medicine | Admitting: Family Medicine

## 2019-03-25 ENCOUNTER — Encounter (HOSPITAL_COMMUNITY): Payer: Self-pay

## 2019-03-25 ENCOUNTER — Emergency Department (HOSPITAL_COMMUNITY): Payer: Medicare Other

## 2019-03-25 ENCOUNTER — Other Ambulatory Visit: Payer: Self-pay

## 2019-03-25 DIAGNOSIS — J189 Pneumonia, unspecified organism: Secondary | ICD-10-CM

## 2019-03-25 DIAGNOSIS — E1110 Type 2 diabetes mellitus with ketoacidosis without coma: Secondary | ICD-10-CM

## 2019-03-25 DIAGNOSIS — Z87891 Personal history of nicotine dependence: Secondary | ICD-10-CM | POA: Diagnosis not present

## 2019-03-25 DIAGNOSIS — I82431 Acute embolism and thrombosis of right popliteal vein: Secondary | ICD-10-CM | POA: Diagnosis present

## 2019-03-25 DIAGNOSIS — F03918 Unspecified dementia, unspecified severity, with other behavioral disturbance: Secondary | ICD-10-CM | POA: Diagnosis present

## 2019-03-25 DIAGNOSIS — I10 Essential (primary) hypertension: Secondary | ICD-10-CM | POA: Diagnosis present

## 2019-03-25 DIAGNOSIS — E860 Dehydration: Secondary | ICD-10-CM | POA: Diagnosis present

## 2019-03-25 DIAGNOSIS — I82411 Acute embolism and thrombosis of right femoral vein: Secondary | ICD-10-CM | POA: Diagnosis present

## 2019-03-25 DIAGNOSIS — E870 Hyperosmolality and hypernatremia: Secondary | ICD-10-CM | POA: Diagnosis present

## 2019-03-25 DIAGNOSIS — R000 Tachycardia, unspecified: Secondary | ICD-10-CM | POA: Diagnosis not present

## 2019-03-25 DIAGNOSIS — N179 Acute kidney failure, unspecified: Secondary | ICD-10-CM | POA: Diagnosis present

## 2019-03-25 DIAGNOSIS — D6959 Other secondary thrombocytopenia: Secondary | ICD-10-CM | POA: Diagnosis present

## 2019-03-25 DIAGNOSIS — R918 Other nonspecific abnormal finding of lung field: Secondary | ICD-10-CM | POA: Diagnosis not present

## 2019-03-25 DIAGNOSIS — E782 Mixed hyperlipidemia: Secondary | ICD-10-CM | POA: Diagnosis present

## 2019-03-25 DIAGNOSIS — R05 Cough: Secondary | ICD-10-CM

## 2019-03-25 DIAGNOSIS — Z79899 Other long term (current) drug therapy: Secondary | ICD-10-CM | POA: Diagnosis not present

## 2019-03-25 DIAGNOSIS — R404 Transient alteration of awareness: Secondary | ICD-10-CM | POA: Diagnosis not present

## 2019-03-25 DIAGNOSIS — I491 Atrial premature depolarization: Secondary | ICD-10-CM | POA: Diagnosis not present

## 2019-03-25 DIAGNOSIS — J1289 Other viral pneumonia: Secondary | ICD-10-CM | POA: Diagnosis present

## 2019-03-25 DIAGNOSIS — R0602 Shortness of breath: Secondary | ICD-10-CM | POA: Diagnosis not present

## 2019-03-25 DIAGNOSIS — I82409 Acute embolism and thrombosis of unspecified deep veins of unspecified lower extremity: Secondary | ICD-10-CM

## 2019-03-25 DIAGNOSIS — J45909 Unspecified asthma, uncomplicated: Secondary | ICD-10-CM | POA: Diagnosis present

## 2019-03-25 DIAGNOSIS — U071 COVID-19: Secondary | ICD-10-CM | POA: Diagnosis present

## 2019-03-25 DIAGNOSIS — J9601 Acute respiratory failure with hypoxia: Secondary | ICD-10-CM | POA: Diagnosis present

## 2019-03-25 DIAGNOSIS — I2692 Saddle embolus of pulmonary artery without acute cor pulmonale: Secondary | ICD-10-CM | POA: Diagnosis not present

## 2019-03-25 DIAGNOSIS — I82451 Acute embolism and thrombosis of right peroneal vein: Secondary | ICD-10-CM | POA: Diagnosis present

## 2019-03-25 DIAGNOSIS — T17908A Unspecified foreign body in respiratory tract, part unspecified causing other injury, initial encounter: Secondary | ICD-10-CM

## 2019-03-25 DIAGNOSIS — I82432 Acute embolism and thrombosis of left popliteal vein: Secondary | ICD-10-CM | POA: Diagnosis present

## 2019-03-25 DIAGNOSIS — Z7984 Long term (current) use of oral hypoglycemic drugs: Secondary | ICD-10-CM | POA: Diagnosis not present

## 2019-03-25 DIAGNOSIS — R059 Cough, unspecified: Secondary | ICD-10-CM

## 2019-03-25 DIAGNOSIS — F0391 Unspecified dementia with behavioral disturbance: Secondary | ICD-10-CM | POA: Diagnosis present

## 2019-03-25 DIAGNOSIS — E1165 Type 2 diabetes mellitus with hyperglycemia: Secondary | ICD-10-CM | POA: Diagnosis present

## 2019-03-25 DIAGNOSIS — R069 Unspecified abnormalities of breathing: Secondary | ICD-10-CM | POA: Diagnosis not present

## 2019-03-25 DIAGNOSIS — I82441 Acute embolism and thrombosis of right tibial vein: Secondary | ICD-10-CM | POA: Diagnosis present

## 2019-03-25 DIAGNOSIS — R7989 Other specified abnormal findings of blood chemistry: Secondary | ICD-10-CM | POA: Diagnosis not present

## 2019-03-25 DIAGNOSIS — I2609 Other pulmonary embolism with acute cor pulmonale: Secondary | ICD-10-CM | POA: Diagnosis present

## 2019-03-25 DIAGNOSIS — R630 Anorexia: Secondary | ICD-10-CM

## 2019-03-25 LAB — CBG MONITORING, ED
Glucose-Capillary: >600 mg/dL (ref 70–99)
Glucose-Capillary: 590 mg/dL (ref 70–99)
Glucose-Capillary: 570 mg/dL (ref 70–99)
Glucose-Capillary: 440 mg/dL — ABNORMAL HIGH (ref 70–99)
Glucose-Capillary: 359 mg/dL — ABNORMAL HIGH (ref 70–99)
Glucose-Capillary: 299 mg/dL — ABNORMAL HIGH (ref 70–99)
Glucose-Capillary: 201 mg/dL — ABNORMAL HIGH (ref 70–99)

## 2019-03-25 LAB — CBC
HCT: 47.9 % (ref 39.0–52.0)
Hemoglobin: 14.9 g/dL (ref 13.0–17.0)
MCH: 27.3 pg (ref 26.0–34.0)
MCHC: 31.1 g/dL (ref 30.0–36.0)
MCV: 87.9 fL (ref 80.0–100.0)
Platelets: 158 10*3/uL (ref 150–400)
RBC: 5.45 MIL/uL (ref 4.22–5.81)
RDW: 13.8 % (ref 11.5–15.5)
WBC: 12.5 10*3/uL — ABNORMAL HIGH (ref 4.0–10.5)
nRBC: 0.2 % (ref 0.0–0.2)

## 2019-03-25 LAB — BASIC METABOLIC PANEL WITH GFR
Anion gap: 16 — ABNORMAL HIGH (ref 5–15)
BUN: 53 mg/dL — ABNORMAL HIGH (ref 8–23)
CO2: 21 mmol/L — ABNORMAL LOW (ref 22–32)
Calcium: 9.8 mg/dL (ref 8.9–10.3)
Chloride: 106 mmol/L (ref 98–111)
Creatinine, Ser: 1.81 mg/dL — ABNORMAL HIGH (ref 0.61–1.24)
GFR calc Af Amer: 39 mL/min — ABNORMAL LOW
GFR calc non Af Amer: 33 mL/min — ABNORMAL LOW
Glucose, Bld: 768 mg/dL (ref 70–99)
Potassium: 5.8 mmol/L — ABNORMAL HIGH (ref 3.5–5.1)
Sodium: 143 mmol/L (ref 135–145)

## 2019-03-25 LAB — CBC WITH DIFFERENTIAL/PLATELET
Abs Immature Granulocytes: 0.06 10*3/uL (ref 0.00–0.07)
Basophils Absolute: 0 10*3/uL (ref 0.0–0.1)
Basophils Relative: 0 %
Eosinophils Absolute: 0 10*3/uL (ref 0.0–0.5)
Eosinophils Relative: 0 %
HCT: 47.6 % (ref 39.0–52.0)
Hemoglobin: 15.1 g/dL (ref 13.0–17.0)
Immature Granulocytes: 1 %
Lymphocytes Relative: 7 %
Lymphs Abs: 0.9 10*3/uL (ref 0.7–4.0)
MCH: 27.8 pg (ref 26.0–34.0)
MCHC: 31.7 g/dL (ref 30.0–36.0)
MCV: 87.5 fL (ref 80.0–100.0)
Monocytes Absolute: 0.4 10*3/uL (ref 0.1–1.0)
Monocytes Relative: 3 %
Neutro Abs: 11.3 10*3/uL — ABNORMAL HIGH (ref 1.7–7.7)
Neutrophils Relative %: 89 %
Platelets: 156 10*3/uL (ref 150–400)
RBC: 5.44 MIL/uL (ref 4.22–5.81)
RDW: 13.8 % (ref 11.5–15.5)
WBC: 12.6 10*3/uL — ABNORMAL HIGH (ref 4.0–10.5)
nRBC: 0 % (ref 0.0–0.2)

## 2019-03-25 LAB — BASIC METABOLIC PANEL
Anion gap: 14 (ref 5–15)
BUN: 53 mg/dL — ABNORMAL HIGH (ref 8–23)
CO2: 20 mmol/L — ABNORMAL LOW (ref 22–32)
Calcium: 9.8 mg/dL (ref 8.9–10.3)
Chloride: 115 mmol/L — ABNORMAL HIGH (ref 98–111)
Creatinine, Ser: 1.85 mg/dL — ABNORMAL HIGH (ref 0.61–1.24)
GFR calc Af Amer: 38 mL/min — ABNORMAL LOW (ref >60)
GFR calc non Af Amer: 32 mL/min — ABNORMAL LOW (ref >60)
Glucose, Bld: 528 mg/dL (ref 70–99)
Potassium: 4.6 mmol/L (ref 3.5–5.1)
Sodium: 149 mmol/L — ABNORMAL HIGH (ref 135–145)

## 2019-03-25 LAB — POCT I-STAT EG7
Acid-base deficit: 4 mmol/L — ABNORMAL HIGH (ref 0.0–2.0)
Bicarbonate: 22.1 mmol/L (ref 20.0–28.0)
Calcium, Ion: 1.26 mmol/L (ref 1.15–1.40)
HCT: 46 % (ref 39.0–52.0)
Hemoglobin: 15.6 g/dL (ref 13.0–17.0)
O2 Saturation: 99 %
Potassium: 4.6 mmol/L (ref 3.5–5.1)
Sodium: 150 mmol/L — ABNORMAL HIGH (ref 135–145)
TCO2: 23 mmol/L (ref 22–32)
pCO2, Ven: 44 mmHg (ref 44.0–60.0)
pH, Ven: 7.31 (ref 7.250–7.430)
pO2, Ven: 127 mmHg — ABNORMAL HIGH (ref 32.0–45.0)

## 2019-03-25 LAB — HEPATIC FUNCTION PANEL
ALT: 46 U/L — ABNORMAL HIGH (ref 0–44)
AST: 38 U/L (ref 15–41)
Albumin: 3.5 g/dL (ref 3.5–5.0)
Alkaline Phosphatase: 56 U/L (ref 38–126)
Bilirubin, Direct: 0.3 mg/dL — ABNORMAL HIGH (ref 0.0–0.2)
Indirect Bilirubin: 0.6 mg/dL (ref 0.3–0.9)
Total Bilirubin: 0.9 mg/dL (ref 0.3–1.2)
Total Protein: 8.1 g/dL (ref 6.5–8.1)

## 2019-03-25 LAB — ABO/RH: ABO/RH(D): A POS

## 2019-03-25 LAB — BETA-HYDROXYBUTYRIC ACID: Beta-Hydroxybutyric Acid: 0.57 mmol/L — ABNORMAL HIGH (ref 0.05–0.27)

## 2019-03-25 LAB — LACTIC ACID, PLASMA: Lactic Acid, Venous: 6.1 mmol/L (ref 0.5–1.9)

## 2019-03-25 LAB — LIPASE, BLOOD: Lipase: 68 U/L — ABNORMAL HIGH (ref 11–51)

## 2019-03-25 MED ORDER — INSULIN ASPART 100 UNIT/ML ~~LOC~~ SOLN
10.0000 [IU] | Freq: Once | SUBCUTANEOUS | Status: AC
  Administered 2019-03-25: 10 [IU] via SUBCUTANEOUS

## 2019-03-25 MED ORDER — DEXAMETHASONE SODIUM PHOSPHATE 10 MG/ML IJ SOLN
6.0000 mg | INTRAMUSCULAR | Status: DC
  Administered 2019-03-26 – 2019-03-30 (×6): 6 mg via INTRAVENOUS
  Filled 2019-03-25 (×6): qty 1

## 2019-03-25 MED ORDER — ENOXAPARIN SODIUM 30 MG/0.3ML ~~LOC~~ SOLN
30.0000 mg | SUBCUTANEOUS | Status: DC
  Administered 2019-03-26: 30 mg via SUBCUTANEOUS
  Filled 2019-03-25: qty 0.3

## 2019-03-25 MED ORDER — SODIUM CHLORIDE 0.9% FLUSH
3.0000 mL | Freq: Two times a day (BID) | INTRAVENOUS | Status: DC
  Administered 2019-03-26 – 2019-04-01 (×8): 3 mL via INTRAVENOUS

## 2019-03-25 MED ORDER — VITAMIN C 500 MG PO TABS
500.0000 mg | ORAL_TABLET | Freq: Every day | ORAL | Status: DC
  Administered 2019-03-26 – 2019-04-01 (×7): 500 mg via ORAL
  Filled 2019-03-25 (×7): qty 1

## 2019-03-25 MED ORDER — INSULIN REGULAR(HUMAN) IN NACL 100-0.9 UT/100ML-% IV SOLN
INTRAVENOUS | Status: DC
  Administered 2019-03-25: 12 [IU]/h via INTRAVENOUS
  Filled 2019-03-25: qty 100

## 2019-03-25 MED ORDER — FOLIC ACID 1 MG PO TABS
1.0000 mg | ORAL_TABLET | Freq: Every day | ORAL | Status: DC
  Administered 2019-03-26 – 2019-04-01 (×7): 1 mg via ORAL
  Filled 2019-03-25 (×7): qty 1

## 2019-03-25 MED ORDER — INSULIN REGULAR(HUMAN) IN NACL 100-0.9 UT/100ML-% IV SOLN
INTRAVENOUS | Status: DC
  Administered 2019-03-26: 7 [IU]/h via INTRAVENOUS
  Administered 2019-03-26: 0.8 [IU]/h via INTRAVENOUS
  Filled 2019-03-25: qty 100

## 2019-03-25 MED ORDER — INSULIN ASPART 100 UNIT/ML ~~LOC~~ SOLN: 5.0000 [IU] | Freq: Once | SUBCUTANEOUS | Status: DC

## 2019-03-25 MED ORDER — DEXTROSE 50 % IV SOLN
0.0000 mL | INTRAVENOUS | Status: DC | PRN
  Administered 2019-03-30: 50 mL via INTRAVENOUS

## 2019-03-25 MED ORDER — VANCOMYCIN HCL IN DEXTROSE 1-5 GM/200ML-% IV SOLN: 1000.0000 mg | Freq: Once | INTRAVENOUS | Status: DC

## 2019-03-25 MED ORDER — SODIUM CHLORIDE 0.9 % IV SOLN
2.0000 g | Freq: Two times a day (BID) | INTRAVENOUS | Status: DC
  Administered 2019-03-26: 2 g via INTRAVENOUS
  Filled 2019-03-25: qty 2

## 2019-03-25 MED ORDER — DEXTROSE-NACL 5-0.45 % IV SOLN
INTRAVENOUS | Status: DC
  Administered 2019-03-26: 02:00:00 via INTRAVENOUS

## 2019-03-25 MED ORDER — SODIUM CHLORIDE 0.9 % IV SOLN
INTRAVENOUS | Status: DC
  Administered 2019-03-26: 01:00:00 via INTRAVENOUS

## 2019-03-25 MED ORDER — SODIUM CHLORIDE 0.9 % IV BOLUS
1000.0000 mL | Freq: Once | INTRAVENOUS | Status: AC
  Administered 2019-03-25: 17:00:00 1000 mL via INTRAVENOUS

## 2019-03-25 MED ORDER — ZINC SULFATE 220 (50 ZN) MG PO CAPS
220.0000 mg | ORAL_CAPSULE | Freq: Every day | ORAL | Status: DC
  Administered 2019-03-26 – 2019-04-01 (×7): 220 mg via ORAL
  Filled 2019-03-25 (×8): qty 1

## 2019-03-25 MED ORDER — DEXTROSE-NACL 5-0.45 % IV SOLN
INTRAVENOUS | Status: DC
  Administered 2019-03-25: 23:00:00 via INTRAVENOUS

## 2019-03-25 MED ORDER — VANCOMYCIN HCL 10 G IV SOLR
1500.0000 mg | Freq: Once | INTRAVENOUS | Status: AC
  Administered 2019-03-25: 1500 mg via INTRAVENOUS
  Filled 2019-03-25: qty 1500

## 2019-03-25 MED ORDER — SODIUM CHLORIDE 0.45 % IV SOLN: INTRAVENOUS | Status: DC

## 2019-03-25 MED ORDER — SODIUM CHLORIDE 0.9 % IV SOLN
2.0000 g | Freq: Once | INTRAVENOUS | Status: AC
  Administered 2019-03-25: 2 g via INTRAVENOUS
  Filled 2019-03-25: qty 2

## 2019-03-25 MED ORDER — SODIUM CHLORIDE 0.9 % IV SOLN
INTRAVENOUS | Status: DC
  Administered 2019-03-25: 19:00:00 via INTRAVENOUS

## 2019-03-25 MED ORDER — DEXTROSE 50 % IV SOLN
0.0000 mL | INTRAVENOUS | Status: DC | PRN
  Filled 2019-03-25: qty 50

## 2019-03-25 MED ORDER — SODIUM CHLORIDE 0.9 % IV SOLN: 2.0000 g | INTRAVENOUS | Status: DC

## 2019-03-25 NOTE — H&P (Signed)
History and Physical    Marlan Steward WJX:[id number] DOB: 01-25-1932 DOA: 03/25/2019  PCP: Lyndon Code, MD    Patient coming from: Nursing home    Chief Complaint: Shortness of breath, elevated blood sugar   HPI: Tyler Jimenez is a 85 y.o. male with medical history significant of diabetes mellitus type 2 hypertension dementia, nursing home resident, was diagnosed with Covid positive at nursing home.  Was started on Decadron.  Patient has history of diabetes mellitus type 2 is on insulin.  His blood sugar went up to 780.  With anion gap 16 CO2 21.  Same time patient has acute kidney injury secondary to dehydration.  His acidosis is a combination of mild DKA and acute kidney injury.  Chest x-ray showed right lower lobe infiltrate Patient was started on Endo tool protocol, given Maxipime for his pneumonia, repeat Covid test. Emergency room physician discussed the case with the family no remdesivir.  ED Course:  ED course patient alert but nonverbal. Blood sugar is improving with fluids and insulin drips.   Review of Systems: As per HPI otherwise 10 point review of systems negative.  Unable to because of the patient mental status.  Past Medical History:  Diagnosis Date  . Asthma    as a child  . Cataract   . Dementia (HCC)   . Diabetes mellitus without complication (HCC)   . HOH (hard of hearing)   . Hypertension     Past Surgical History:  Procedure Laterality Date  . CATARACT EXTRACTION W/PHACO Right 01/05/2015   Procedure: CATARACT EXTRACTION PHACO AND INTRAOCULAR LENS PLACEMENT (IOC);  Surgeon: Galen Manila, MD;  Location: [facility] ORS;  Service: Ophthalmology;  Laterality: Right;  [place]: 02:33.9 AP%: 29.9 CDE: 46.04 Lot# [id number]  . CATARACT EXTRACTION W/PHACO Left 01/19/2015   Procedure: CATARACT EXTRACTION PHACO AND INTRAOCULAR LENS PLACEMENT (IOC);  Surgeon: Galen Manila, MD;  Location: [facility] ORS;  Service: Ophthalmology;  Laterality: Left;  [place]: 03:59.3 AP%:  28.9 CDE: 69.13 Lot # [id number]  . KNEE SURGERY Right      reports that he has quit smoking. He has never used smokeless tobacco. He reports current alcohol use. He reports that he does not use drugs.  No Known Allergies  Family History  Problem Relation Age of Onset  . Prostate cancer Father   . Bladder Cancer Neg Hx   . Kidney cancer Neg Hx      Prior to Admission medications   Medication Sig Start Date End Date Taking? Authorizing Provider  acetaminophen (TYLENOL) 500 MG tablet Take 500 mg by mouth every 6 (six) hours as needed for mild pain.   Yes [provider]  alum & mag hydroxide-simeth (MAALOX/MYLANTA) 200-200-20 MG/5ML suspension Take 30 mLs by mouth every 6 (six) hours as needed for indigestion or heartburn.   Yes [provider]  amLODipine (NORVASC) 10 MG tablet Take 1 tablet (10 mg total) by mouth daily. 12/16/18  Yes Boscia, Heather E, NP  dexamethasone (DECADRON) 6 MG tablet Take 6 mg by mouth 2 (two) times daily with a meal. For 10 days Started on 11.14.20   Yes [provider]  glimepiride (AMARYL) 2 MG tablet Take 1 tablet (2 mg total) by mouth daily. 12/16/18  Yes Boscia, Kathlynn Grate, NP  guaifenesin (ROBITUSSIN) 100 MG/5ML syrup Take 200 mg by mouth every 6 (six) hours as needed for cough.   Yes [provider]  KOMBIGLYZE XR 09-998 MG TB24 Take 1 tablet by mouth daily with supper. 12/16/18  Yes Ronnell Freshwater, NP  loperamide (IMODIUM) 2 MG capsule Take 2-4 mg by mouth as needed for diarrhea or loose stools.   Yes [provider]  LORazepam (ATIVAN) 0.5 MG tablet Take 0.5 mg by mouth every 8 (eight) hours as needed (agitation).   Yes [provider]  magnesium hydroxide (MILK OF MAGNESIA) 400 MG/5ML suspension Take 30 mLs by mouth daily as needed for mild constipation.   Yes [provider]  mirtazapine (REMERON) 7.5 MG tablet Take 1 tablet (7.5 mg total) by mouth at bedtime. Patient taking differently:  Take 7.5 mg by mouth daily with supper.  01/17/19  Yes Ronnell Freshwater, NP  Multiple Vitamin (MULTI-VITAMINS) TABS Take 1 tablet by mouth daily.    Yes [provider]  pioglitazone (ACTOS) 45 MG tablet Take 1 tablet (45 mg total) by mouth daily. 08/30/18  Yes Boscia, Heather E, NP  glucose blood (FREESTYLE LITE) test strip 1 each by Other route 2 (two) times daily. diag e11.65 01/15/19   Ronnell Freshwater, NP    Physical Exam: Vitals:   03/25/19 1540 03/25/19 1700 03/25/19 1845 03/25/19 1900  BP: (!) 157/97  (!) 137/92 (!) 155/139  Pulse: (!) 115  (!) 108 (!) 121  Resp: 20  (!) 27 (!) 26  Temp: 98 F (36.7 C)     TempSrc: Axillary     SpO2: 99%  95% (!) 82%  Weight:  79.4 kg    Height:  5\' 6"  (1.676 m)      Constitutional: NAD, calm, comfortable Vitals:   03/25/19 1540 03/25/19 1700 03/25/19 1845 03/25/19 1900  BP: (!) 157/97  (!) 137/92 (!) 155/139  Pulse: (!) 115  (!) 108 (!) 121  Resp: 20  (!) 27 (!) 26  Temp: 98 F (36.7 C)     TempSrc: Axillary     SpO2: 99%  95% (!) 82%  Weight:  79.4 kg    Height:  5\' 6"  (1.676 m)     Eyes: PERRL, lids and conjunctivae normal ENMT: Mucous membranes are moist. Posterior pharynx clear of any exudate or lesions.Normal dentition.  Neck: normal, supple, no masses, no thyromegaly Respiratory: clear to auscultation bilaterally, no wheezing, no crackles. Normal respiratory effort. No accessory muscle use.  Cardiovascular: Regular rate and rhythm, no murmurs / rubs / gallops. No extremity edema. 2+ pedal pulses. No carotid bruits.  Abdomen: no tenderness, no masses palpated. No hepatosplenomegaly. Bowel sounds positive.  Musculoskeletal: no clubbing / cyanosis. No joint deformity upper and lower extremities. Good ROM, no contractures. Normal muscle tone.  Skin: no rashes, lesions, ulcers. No induration Neurologic: Because of the patient mental status Psychiatric: Unable because of the patient mental status.   Labs on Admission: I  have personally reviewed following labs and imaging studies  CBC: Recent Labs  Lab 03/25/19 1554 03/25/19 1909 03/25/19 1947  WBC 12.5* 12.6*  --   NEUTROABS  --  11.3*  --   HGB 14.9 15.1 15.6  HCT 47.9 47.6 46.0  MCV 87.9 87.5  --   PLT 158 156  --    Basic Metabolic Panel: Recent Labs  Lab 03/25/19 1554 03/25/19 1909 03/25/19 1947  NA 143 149* 150*  K 5.8* 4.6 4.6  CL 106 115*  --   CO2 21* 20*  --   GLUCOSE 768* 528*  --   BUN 53* 53*  --   CREATININE 1.81* 1.85*  --   CALCIUM 9.8 9.8  --  GFR: Estimated Creatinine Clearance: 28.9 mL/min (A) (by C-G formula based on SCr of 1.85 mg/dL (H)). Liver Function Tests: Recent Labs  Lab 03/25/19 1620  AST 38  ALT 46*  ALKPHOS 56  BILITOT 0.9  PROT 8.1  ALBUMIN 3.5   Recent Labs  Lab 03/25/19 1620  LIPASE 68*   No results for input(s): AMMONIA in the last 168 hours. Coagulation Profile: No results for input(s): INR, PROTIME in the last 168 hours. Cardiac Enzymes: No results for input(s): CKTOTAL, CKMB, CKMBINDEX, TROPONINI in the last 168 hours. BNP (last 3 results) No results for input(s): PROBNP in the last 8760 hours. HbA1C: No results for input(s): HGBA1C in the last 72 hours. CBG: Recent Labs  Lab 03/25/19 1619 03/25/19 1814 03/25/19 1921 03/25/19 1957  GLUCAP >600* 590* 570* 440*   Lipid Profile: No results for input(s): CHOL, HDL, LDLCALC, TRIG, CHOLHDL, LDLDIRECT in the last 72 hours. Thyroid Function Tests: No results for input(s): TSH, T4TOTAL, FREET4, T3FREE, THYROIDAB in the last 72 hours. Anemia Panel: No results for input(s): VITAMINB12, FOLATE, FERRITIN, TIBC, IRON, RETICCTPCT in the last 72 hours. Urine analysis:    Component Value Date/Time   COLORURINE YELLOW (A) 02/04/2019 1057   APPEARANCEUR HAZY (A) 02/04/2019 1057   APPEARANCEUR Clear 04/29/2018 0326   LABSPEC 1.012 02/04/2019 1057   PHURINE 5.0 02/04/2019 1057   GLUCOSEU NEGATIVE 02/04/2019 1057   HGBUR NEGATIVE  02/04/2019 1057   BILIRUBINUR NEGATIVE 02/04/2019 1057   BILIRUBINUR negative 01/17/2019 1635   BILIRUBINUR Negative 04/29/2018 0326   KETONESUR NEGATIVE 02/04/2019 1057   PROTEINUR NEGATIVE 02/04/2019 1057   UROBILINOGEN 0.2 01/17/2019 1635   NITRITE NEGATIVE 02/04/2019 1057   LEUKOCYTESUR NEGATIVE 02/04/2019 1057    Radiological Exams on Admission: Dg Chest Port 1 View  Result Date: 03/25/2019 CLINICAL DATA:  Cough, shortness of breath EXAM: PORTABLE CHEST 1 VIEW COMPARISON:  None. FINDINGS: The heart size and mediastinal contours are within normal limits. Streaky right basilar opacity. No pleural effusion or pneumothorax. The visualized skeletal structures are unremarkable. IMPRESSION: Streaky right basilar opacity which may reflect atelectasis and/or developing infection. Electronically Signed   By: Duanne GuessNicholas  Plundo M.D.   On: 03/25/2019 16:56    EKG: Independently reviewed.  Sinus tachycardia no acute ST-T changes  Assessment/Plan Active Problems:   Dementia with behavioral disturbance (HCC)   Essential hypertension   Mixed hyperlipidemia   Uncontrolled type 2 diabetes mellitus with hyperglycemia (HCC)   DKA, type 2 (HCC)   AKI (acute kidney injury) (HCC)   Right lower lobe pulmonary infiltrate   COVID-19 virus infection   Healthcare-associated pneumonia   Assessment and plan  Mild DKA Patient with diabetes mellitus type 2 On insulin Started on Decadron for Covid 19-day 3 Blood sugar 760 CO2 21 anion gap 16 Plan start Endo tool protocol sodium is normal change normal saline to half-normal saline  Acute kidney injury Secondary to DKA dehydration IV fluids avoid nephrotoxins follow BUN and creatinine  Healthcare associated pneumonia Chest x-ray showed right lower lobe infiltrate Patient started on cefepime by emergency room physician will continue with same  Respiratory failure with hypoxemia Nasal cannula oxygen  Essential hypertension Patient needs n.p.o.  hydralazine IV as needed for systolic blood pressure more than 180  Dementia Resume home medication when he can swallow  Covid positive Per family member and the emergency room physician Do not want remdesivir     DVT prophylaxis: Lovenox Code Status: Full code Family Communication: Not in the room discussed with  ED Disposition Plan: Admit and transfer to Covid hospital Consults called: No Admission status: Full admission   Victorino Sparrow Takirah Binford MD [facility]  If 7PM-7AM, please contact night-coverage [email or web address]   03/25/2019, 7:59 PM

## 2019-03-25 NOTE — ED Triage Notes (Addendum)
Patient arrived by [facility] from [facility] memory care unit for increased cough following Covid diagnosis. Patient arrived on 2l of oxygen and sats 100%. Patient alert and per EMS at baseline. Denies pain. Patients BS also reads high. Can find no documentation of positive covid-spoke with [facility] and positive covid as of 11/10

## 2019-03-25 NOTE — Progress Notes (Signed)
Pharmacy Antibiotic Note  Tyler Jimenez is a 85 y.o. male admitted on 03/25/2019 with pneumonia.  Pharmacy has been consulted for Cefepime dosing.  Height: 5\' 6"  (167.6 cm) Weight: 175 lb (79.4 kg) IBW/kg (Calculated) : 63.8  Temp (24hrs), Avg:98 F (36.7 C), Min:98 F (36.7 C), Max:98 F (36.7 C)  Recent Labs  Lab 03/25/19 1554 03/25/19 1909  WBC 12.5* 12.6*  CREATININE 1.81* 1.85*  LATICACIDVEN  --  6.1*    Estimated Creatinine Clearance: 28.9 mL/min (A) (by C-G formula based on SCr of 1.85 mg/dL (H)).    No Known Allergies  Antimicrobials this admission: 11/17 Cefepime >>  11/17 Vancomycin >> 11/17 one dose    Microbiology results: 11/17 BCx: Pending  Plan: - Cefepime 2g IV q12h (expecting improvement in Scr) - Monitor patients Scr for improvement as appears patient currently has an AKI    Thank you for allowing pharmacy to be a part of this patient's care.  Duanne Limerick PharmD. BCPS  03/25/2019 8:09 PM

## 2019-03-25 NOTE — ED Notes (Signed)
Update pts daughter, Governor Specking, [phone]) when possible.

## 2019-03-25 NOTE — ED Provider Notes (Addendum)
Tyler Jimenez [facility] Provider Note   CSN: [id number] Arrival date & time: 03/25/19  1519     History   Chief Complaint Chief Complaint  Patient presents with  . Covid+/ hyperglycemia    HPI Tyler Jimenez is a 85 y.o. male hx of asthma, dementia, hypertension here presenting with shortness of breath, hyperglycemia.  [facility].  Per the facility, there is a Covid outbreak there and patient was tested positive at the facility.  Patient apparently has been running elevated blood sugars as well.  Patient unable to give me much history.  He was also noted to be more short of breath and was put on 2 L nasal cannula.     The history is provided by the patient and the EMS personnel.  Level V caveat- dementia   Past Medical History:  Diagnosis Date  . Asthma    as a child  . Cataract   . Dementia (HCC)   . Diabetes mellitus without complication (HCC)   . HOH (hard of hearing)   . Hypertension     Patient Active Problem List   Diagnosis Date Noted  . DKA, type 2 (HCC) 03/25/2019  . AKI (acute kidney injury) (HCC) 03/25/2019  . Right lower lobe pulmonary infiltrate 03/25/2019  . COVID-19 virus infection 03/25/2019  . Healthcare-associated pneumonia 03/25/2019  . Urinary tract infection with hematuria 01/26/2019  . Loss of appetite 01/26/2019  . Current moderate episode of major depressive disorder without prior episode (HCC) 01/26/2019  . Fall 09/21/2018  . Encounter for health maintenance examination with abnormal findings 04/29/2018  . Dysuria 04/29/2018  . Uncontrolled type 2 diabetes mellitus with hyperglycemia (HCC) 10/17/2017  . Tinea pedis of both feet 10/17/2017  . Dementia with behavioral disturbance (HCC) 04/25/2017  . DM2 (diabetes mellitus, type 2) (HCC) 04/25/2017  . Essential hypertension 04/25/2017  . Cardiac murmur 04/25/2017  . Mixed hyperlipidemia 04/25/2017    Past Surgical History:  Procedure  Laterality Date  . CATARACT EXTRACTION W/PHACO Right 01/05/2015   Procedure: CATARACT EXTRACTION PHACO AND INTRAOCULAR LENS PLACEMENT (IOC);  Surgeon: Galen ManilaWilliam Porfilio, MD;  Location: [facility] ORS;  Service: Ophthalmology;  Laterality: Right;  US: 02:33.9 AP%: 29.9 CDE: 46.04 Lot# [id number]  . CATARACT EXTRACTION W/PHACO Left 01/19/2015   Procedure: CATARACT EXTRACTION PHACO AND INTRAOCULAR LENS PLACEMENT (IOC);  Surgeon: Galen ManilaWilliam Porfilio, MD;  Location: [facility] ORS;  Service: Ophthalmology;  Laterality: Left;  US: 03:59.3 AP%: 28.9 CDE: 69.13 Lot # [id number]  . KNEE SURGERY Right         Home Medications    Prior to Admission medications   Medication Sig Start Date End Date Taking? Authorizing Provider  acetaminophen (TYLENOL) 500 MG tablet Take 500 mg by mouth every 6 (six) hours as needed for mild pain.   Yes [provider]  alum & mag hydroxide-simeth (MAALOX/MYLANTA) 200-200-20 MG/5ML suspension Take 30 mLs by mouth every 6 (six) hours as needed for indigestion or heartburn.   Yes [provider]  amLODipine (NORVASC) 10 MG tablet Take 1 tablet (10 mg total) by mouth daily. 12/16/18  Yes Boscia, Heather E, NP  dexamethasone (DECADRON) 6 MG tablet Take 6 mg by mouth 2 (two) times daily with a meal. For 10 days Started on 11.14.20   Yes [provider]  glimepiride (AMARYL) 2 MG tablet Take 1 tablet (2 mg total) by mouth daily. 12/16/18  Yes Carlean JewsBoscia, Heather E, NP  guaifenesin (ROBITUSSIN) 100 MG/5ML syrup Take 200  mg by mouth every 6 (six) hours as needed for cough.   Yes [provider]  KOMBIGLYZE XR 09-998 MG TB24 Take 1 tablet by mouth daily with supper. 12/16/18  Yes Carlean Jews, NP  loperamide (IMODIUM) 2 MG capsule Take 2-4 mg by mouth as needed for diarrhea or loose stools.   Yes [provider]  LORazepam (ATIVAN) 0.5 MG tablet Take 0.5 mg by mouth every 8 (eight) hours as needed (agitation).   Yes [provider]  magnesium  hydroxide (MILK OF MAGNESIA) 400 MG/5ML suspension Take 30 mLs by mouth daily as needed for mild constipation.   Yes [provider]  mirtazapine (REMERON) 7.5 MG tablet Take 1 tablet (7.5 mg total) by mouth at bedtime. Patient taking differently: Take 7.5 mg by mouth daily with supper.  01/17/19  Yes Carlean Jews, NP  Multiple Vitamin (MULTI-VITAMINS) TABS Take 1 tablet by mouth daily.    Yes [provider]  pioglitazone (ACTOS) 45 MG tablet Take 1 tablet (45 mg total) by mouth daily. 08/30/18  Yes Boscia, Heather E, NP  glucose blood (FREESTYLE LITE) test strip 1 each by Other route 2 (two) times daily. diag e11.65 01/15/19   Carlean Jews, NP    Family History Family History  Problem Relation Age of Onset  . Prostate cancer Father   . Bladder Cancer Neg Hx   . Kidney cancer Neg Hx     Social History Social History   Tobacco Use  . Smoking status: Former Games developer  . Smokeless tobacco: Never Used  Substance Use Topics  . Alcohol use: Yes    Alcohol/week: 0.0 standard drinks    Comment: ocassionally  . Drug use: No     Allergies   Patient has no known allergies.   Review of Systems Review of Systems  Respiratory: Positive for shortness of breath.   Neurological: Positive for weakness.  All other systems reviewed and are negative.    Physical Exam Updated Vital Signs BP (!) 137/92 (BP Location: Right Arm)   Pulse (!) 108   Temp 98 F (36.7 C) (Axillary)   Resp (!) 27   Ht  (1.676 m)   Wt 79.4 kg   SpO2 95%   BMI 28.25 kg/m   Physical Exam Vitals signs and nursing note reviewed.  Constitutional:      Comments: Chronically ill   HENT:     Head: Normocephalic.     Nose: Nose normal.     Mouth/Throat:     Mouth: Mucous membranes are dry.  Eyes:     Extraocular Movements: Extraocular movements intact.     Pupils: Pupils are equal, round, and reactive to light.  Neck:     Musculoskeletal: Normal range of motion.  Cardiovascular:      Rate and Rhythm: Normal rate and regular rhythm.     Pulses: Normal pulses.  Pulmonary:     Effort: Pulmonary effort is normal.     Breath sounds: Normal breath sounds.  Abdominal:     General: Abdomen is flat.     Palpations: Abdomen is soft.  Musculoskeletal:     Comments: No pedal edema   Skin:    General: Skin is warm.     Capillary Refill: Capillary refill takes less than 2 seconds.  Neurological:     General: No focal deficit present.     Comments: Demented, moving all extremities       ED Treatments / Results  Labs (  all labs ordered are listed, but only abnormal results are displayed) Labs Reviewed  BASIC METABOLIC PANEL - Abnormal; Notable for the following components:      Result Value   Potassium 5.8 (*)    CO2 21 (*)    Glucose, Bld 768 (*)    BUN 53 (*)    Creatinine, Ser 1.81 (*)    GFR calc non Af Amer 33 (*)    GFR calc Af Amer 39 (*)    Anion gap 16 (*)    All other components within normal limits  CBC - Abnormal; Notable for the following components:   WBC 12.5 (*)    All other components within normal limits  HEPATIC FUNCTION PANEL - Abnormal; Notable for the following components:   ALT 46 (*)    Bilirubin, Direct 0.3 (*)    All other components within normal limits  LIPASE, BLOOD - Abnormal; Notable for the following components:   Lipase 68 (*)    All other components within normal limits  BASIC METABOLIC PANEL - Abnormal; Notable for the following components:   Sodium 149 (*)    Chloride 115 (*)    CO2 20 (*)    Glucose, Bld 528 (*)    BUN 53 (*)    Creatinine, Ser 1.85 (*)    GFR calc non Af Amer 32 (*)    GFR calc Af Amer 38 (*)    All other components within normal limits  CBC WITH DIFFERENTIAL/PLATELET - Abnormal; Notable for the following components:   WBC 12.6 (*)    Neutro Abs 11.3 (*)    All other components within normal limits  LACTIC ACID, PLASMA - Abnormal; Notable for the following components:   Lactic Acid, Venous 6.1  (*)    All other components within normal limits  CBG MONITORING, ED - Abnormal; Notable for the following components:   Glucose-Capillary >600 (*)    All other components within normal limits  CBG MONITORING, ED - Abnormal; Notable for the following components:   Glucose-Capillary 590 (*)    All other components within normal limits  CBG MONITORING, ED - Abnormal; Notable for the following components:   Glucose-Capillary 570 (*)    All other components within normal limits  SARS CORONAVIRUS 2 (TAT 6-24 HRS)  CULTURE, BLOOD (ROUTINE X 2)  CULTURE, BLOOD (ROUTINE X 2)  URINALYSIS, ROUTINE W REFLEX MICROSCOPIC  BASIC METABOLIC PANEL  BASIC METABOLIC PANEL  BASIC METABOLIC PANEL  BETA-HYDROXYBUTYRIC ACID  BETA-HYDROXYBUTYRIC ACID  URINALYSIS, ROUTINE W REFLEX MICROSCOPIC  LACTIC ACID, PLASMA  I-STAT VENOUS BLOOD GAS, ED  CBG MONITORING, ED    EKG EKG Interpretation  Date/Time:  Tuesday March 25 2019 15:46:48 EST Ventricular Rate:  117 PR Interval:  142 QRS Duration: 72 QT Interval:  332 QTC Calculation: 463 R Axis:   -55 Text Interpretation: Sinus tachycardia with Premature supraventricular complexes Left axis deviation Abnormal ECG poor baseline Confirmed by Richardean Canal [phone]) on 03/25/2019 4:22:08 PM   Radiology Dg Chest Port 1 View  Result Date: 03/25/2019 CLINICAL DATA:  Cough, shortness of breath EXAM: PORTABLE CHEST 1 VIEW COMPARISON:  None. FINDINGS: The heart size and mediastinal contours are within normal limits. Streaky right basilar opacity. No pleural effusion or pneumothorax. The visualized skeletal structures are unremarkable. IMPRESSION: Streaky right basilar opacity which may reflect atelectasis and/or developing infection. Electronically Signed   By: Duanne Guess M.D.   On: 03/25/2019 16:56    Procedures Procedures (including critical care  time)  CRITICAL CARE Performed by: Wandra Arthurs   Total critical care time: 30 minutes  Critical care  time was exclusive of separately billable procedures and treating other patients.  Critical care was necessary to treat or prevent imminent or life-threatening deterioration.  Critical care was time spent personally by me on the following activities: development of treatment plan with patient and/or surrogate as well as nursing, discussions with consultants, evaluation of patient's response to treatment, examination of patient, obtaining history from patient or surrogate, ordering and performing treatments and interventions, ordering and review of laboratory studies, ordering and review of radiographic studies, pulse oximetry and re-evaluation of patient's condition.   Medications Ordered in ED Medications  insulin regular, human (MYXREDLIN) 100 units/ 100 mL infusion (13 Units/hr Intravenous Rate/Dose Change 03/25/19 1927)  0.9 %  sodium chloride infusion ( Intravenous New Bag/Given 03/25/19 1918)  dextrose 5 %-0.45 % sodium chloride infusion (has no administration in time range)  dextrose 50 % solution 0-50 mL (has no administration in time range)  ceFEPIme (MAXIPIME) 2 g in sodium chloride 0.9 % 100 mL IVPB (2 g Intravenous New Bag/Given 03/25/19 1927)  vancomycin (VANCOCIN) 1,500 mg in sodium chloride 0.9 % 500 mL IVPB (has no administration in time range)  sodium chloride 0.9 % bolus 1,000 mL (0 mLs Intravenous Stopped 03/25/19 1843)  insulin aspart (novoLOG) injection 10 Units (10 Units Subcutaneous Given 03/25/19 1722)     Initial Impression / Assessment and Plan / ED Course  I have reviewed the triage vital signs and the nursing notes.  Pertinent labs & imaging results that were available during my care of the patient were reviewed by me and considered in my medical decision making (see chart for details).       Tyler Jimenez is a 85 y.o. male here presenting with worsening shortness of breath, positive Covid, hyperglycemia. Patient was recently tested positive for COVID. His  CBG reads HI in triage. Also required 2 L [place] as well.  Patient likely is in DKA versus respiratory distress secondary to COVID. Will get labs, do sepsis workup. Will start on insulin drip.   7:50 PM Patient's glucose is 780. AG is 16. CXR showed R basilar opacity. Ordered IV abx. Started on insulin drip. Hospitalist to admit. I updated daughter, Ms. Minette Brine. She is ok about him getting IV insulin. She doesn't want "experimental treatments" for COVID such as redesivir. I notified hospitalist of family's wishes   Final Clinical Impressions(s) / ED Diagnoses   Final diagnoses:  COVID-19  Diabetic ketoacidosis without coma associated with type 2 diabetes mellitus [facility])    ED Discharge Orders    None       Drenda Freeze, MD 03/25/19 Kristian Covey    Drenda Freeze, MD 03/25/19 [phone]

## 2019-03-26 ENCOUNTER — Inpatient Hospital Stay (HOSPITAL_COMMUNITY): Payer: Medicare Other

## 2019-03-26 DIAGNOSIS — U071 COVID-19: Secondary | ICD-10-CM | POA: Diagnosis present

## 2019-03-26 LAB — C-REACTIVE PROTEIN
CRP: 4 mg/dL — ABNORMAL HIGH (ref <1.0)
CRP: 2.6 mg/dL — ABNORMAL HIGH (ref <1.0)

## 2019-03-26 LAB — COMPREHENSIVE METABOLIC PANEL
ALT: 39 U/L (ref 0–44)
AST: 41 U/L (ref 15–41)
Albumin: 2.9 g/dL — ABNORMAL LOW (ref 3.5–5.0)
Alkaline Phosphatase: 46 U/L (ref 38–126)
Anion gap: 14 (ref 5–15)
BUN: 41 mg/dL — ABNORMAL HIGH (ref 8–23)
CO2: 24 mmol/L (ref 22–32)
Calcium: 9.7 mg/dL (ref 8.9–10.3)
Chloride: 119 mmol/L — ABNORMAL HIGH (ref 98–111)
Creatinine, Ser: 1.43 mg/dL — ABNORMAL HIGH (ref 0.61–1.24)
GFR calc Af Amer: 51 mL/min — ABNORMAL LOW (ref >60)
GFR calc non Af Amer: 44 mL/min — ABNORMAL LOW (ref >60)
Glucose, Bld: 163 mg/dL — ABNORMAL HIGH (ref 70–99)
Potassium: 4.9 mmol/L (ref 3.5–5.1)
Sodium: 157 mmol/L — ABNORMAL HIGH (ref 135–145)
Total Bilirubin: 1.1 mg/dL (ref 0.3–1.2)
Total Protein: 7.2 g/dL (ref 6.5–8.1)

## 2019-03-26 LAB — BASIC METABOLIC PANEL
Anion gap: 12 (ref 5–15)
Anion gap: 11 (ref 5–15)
Anion gap: 11 (ref 5–15)
BUN: 43 mg/dL — ABNORMAL HIGH (ref 8–23)
BUN: 40 mg/dL — ABNORMAL HIGH (ref 8–23)
BUN: 36 mg/dL — ABNORMAL HIGH (ref 8–23)
CO2: 24 mmol/L (ref 22–32)
CO2: 25 mmol/L (ref 22–32)
CO2: 22 mmol/L (ref 22–32)
Calcium: 9.7 mg/dL (ref 8.9–10.3)
Calcium: 9.5 mg/dL (ref 8.9–10.3)
Calcium: 9.5 mg/dL (ref 8.9–10.3)
Chloride: 120 mmol/L — ABNORMAL HIGH (ref 98–111)
Chloride: 120 mmol/L — ABNORMAL HIGH (ref 98–111)
Chloride: 123 mmol/L — ABNORMAL HIGH (ref 98–111)
Creatinine, Ser: 1.36 mg/dL — ABNORMAL HIGH (ref 0.61–1.24)
Creatinine, Ser: 1.38 mg/dL — ABNORMAL HIGH (ref 0.61–1.24)
Creatinine, Ser: 1.32 mg/dL — ABNORMAL HIGH (ref 0.61–1.24)
GFR calc Af Amer: 55 mL/min — ABNORMAL LOW (ref >60)
GFR calc Af Amer: 54 mL/min — ABNORMAL LOW (ref >60)
GFR calc Af Amer: 57 mL/min — ABNORMAL LOW (ref >60)
GFR calc non Af Amer: 47 mL/min — ABNORMAL LOW (ref >60)
GFR calc non Af Amer: 46 mL/min — ABNORMAL LOW (ref >60)
GFR calc non Af Amer: 49 mL/min — ABNORMAL LOW (ref >60)
Glucose, Bld: 76 mg/dL (ref 70–99)
Glucose, Bld: 215 mg/dL — ABNORMAL HIGH (ref 70–99)
Glucose, Bld: 182 mg/dL — ABNORMAL HIGH (ref 70–99)
Potassium: 4.4 mmol/L (ref 3.5–5.1)
Potassium: 4.4 mmol/L (ref 3.5–5.1)
Potassium: 4.6 mmol/L (ref 3.5–5.1)
Sodium: 156 mmol/L — ABNORMAL HIGH (ref 135–145)
Sodium: 156 mmol/L — ABNORMAL HIGH (ref 135–145)
Sodium: 156 mmol/L — ABNORMAL HIGH (ref 135–145)

## 2019-03-26 LAB — CBG MONITORING, ED
Glucose-Capillary: 104 mg/dL — ABNORMAL HIGH (ref 70–99)
Glucose-Capillary: 135 mg/dL — ABNORMAL HIGH (ref 70–99)
Glucose-Capillary: 152 mg/dL — ABNORMAL HIGH (ref 70–99)
Glucose-Capillary: 154 mg/dL — ABNORMAL HIGH (ref 70–99)
Glucose-Capillary: 165 mg/dL — ABNORMAL HIGH (ref 70–99)
Glucose-Capillary: 180 mg/dL — ABNORMAL HIGH (ref 70–99)
Glucose-Capillary: 190 mg/dL — ABNORMAL HIGH (ref 70–99)
Glucose-Capillary: 195 mg/dL — ABNORMAL HIGH (ref 70–99)
Glucose-Capillary: 205 mg/dL — ABNORMAL HIGH (ref 70–99)
Glucose-Capillary: 236 mg/dL — ABNORMAL HIGH (ref 70–99)
Glucose-Capillary: 240 mg/dL — ABNORMAL HIGH (ref 70–99)
Glucose-Capillary: 244 mg/dL — ABNORMAL HIGH (ref 70–99)
Glucose-Capillary: 73 mg/dL (ref 70–99)
Glucose-Capillary: 82 mg/dL (ref 70–99)
Glucose-Capillary: 84 mg/dL (ref 70–99)
Glucose-Capillary: 88 mg/dL (ref 70–99)
Glucose-Capillary: >600 mg/dL (ref 70–99)

## 2019-03-26 LAB — HEMOGLOBIN A1C
Hgb A1c MFr Bld: 8.9 % — ABNORMAL HIGH (ref 4.8–5.6)
Mean Plasma Glucose: 208.73 mg/dL

## 2019-03-26 LAB — LACTATE DEHYDROGENASE: LDH: 315 U/L — ABNORMAL HIGH (ref 98–192)

## 2019-03-26 LAB — CBC WITH DIFFERENTIAL/PLATELET
Abs Immature Granulocytes: 0.13 10*3/uL — ABNORMAL HIGH (ref 0.00–0.07)
Basophils Absolute: 0 10*3/uL (ref 0.0–0.1)
Basophils Relative: 0 %
Eosinophils Absolute: 0 10*3/uL (ref 0.0–0.5)
Eosinophils Relative: 0 %
HCT: 45.7 % (ref 39.0–52.0)
Hemoglobin: 13.9 g/dL (ref 13.0–17.0)
Immature Granulocytes: 1 %
Lymphocytes Relative: 6 %
Lymphs Abs: 0.8 10*3/uL (ref 0.7–4.0)
MCH: 27 pg (ref 26.0–34.0)
MCHC: 30.4 g/dL (ref 30.0–36.0)
MCV: 88.7 fL (ref 80.0–100.0)
Monocytes Absolute: 0.5 10*3/uL (ref 0.1–1.0)
Monocytes Relative: 4 %
Neutro Abs: 11.7 10*3/uL — ABNORMAL HIGH (ref 1.7–7.7)
Neutrophils Relative %: 89 %
Platelets: 139 10*3/uL — ABNORMAL LOW (ref 150–400)
RBC: 5.15 MIL/uL (ref 4.22–5.81)
RDW: 13.6 % (ref 11.5–15.5)
WBC: 13.2 10*3/uL — ABNORMAL HIGH (ref 4.0–10.5)
nRBC: 0 % (ref 0.0–0.2)

## 2019-03-26 LAB — MAGNESIUM
Magnesium: 2.6 mg/dL — ABNORMAL HIGH (ref 1.7–2.4)
Magnesium: 2.6 mg/dL — ABNORMAL HIGH (ref 1.7–2.4)

## 2019-03-26 LAB — PHOSPHORUS
Phosphorus: 3.4 mg/dL (ref 2.5–4.6)
Phosphorus: 4.3 mg/dL (ref 2.5–4.6)

## 2019-03-26 LAB — FERRITIN
Ferritin: 864 ng/mL — ABNORMAL HIGH (ref 24–336)
Ferritin: 601 ng/mL — ABNORMAL HIGH (ref 24–336)

## 2019-03-26 LAB — BETA-HYDROXYBUTYRIC ACID
Beta-Hydroxybutyric Acid: 0.08 mmol/L (ref 0.05–0.27)
Beta-Hydroxybutyric Acid: 0.1 mmol/L (ref 0.05–0.27)

## 2019-03-26 LAB — LACTIC ACID, PLASMA: Lactic Acid, Venous: 3.1 mmol/L (ref 0.5–1.9)

## 2019-03-26 LAB — D-DIMER, QUANTITATIVE
D-Dimer, Quant: >20 ug/mL-FEU — ABNORMAL HIGH (ref 0.00–0.50)
D-Dimer, Quant: >20 ug/mL-FEU — ABNORMAL HIGH (ref 0.00–0.50)

## 2019-03-26 LAB — FIBRINOGEN: Fibrinogen: 71 mg/dL — CL (ref 210–475)

## 2019-03-26 LAB — SARS CORONAVIRUS 2 (TAT 6-24 HRS): SARS Coronavirus 2: POSITIVE — AB

## 2019-03-26 LAB — PROCALCITONIN: Procalcitonin: <0.1 ng/mL

## 2019-03-26 MED ORDER — OXYCODONE HCL 5 MG PO TABS
10.0000 mg | ORAL_TABLET | Freq: Once | ORAL | Status: AC
  Administered 2019-03-26: 10 mg via ORAL
  Filled 2019-03-26: qty 2

## 2019-03-26 MED ORDER — VITAMIN D 25 MCG (1000 UNIT) PO TABS
1000.0000 [IU] | ORAL_TABLET | Freq: Every day | ORAL | Status: DC
  Administered 2019-03-27 – 2019-04-01 (×6): 1000 [IU] via ORAL
  Filled 2019-03-26 (×8): qty 1

## 2019-03-26 MED ORDER — INSULIN ASPART 100 UNIT/ML ~~LOC~~ SOLN
3.0000 [IU] | Freq: Three times a day (TID) | SUBCUTANEOUS | Status: DC
  Administered 2019-03-26 – 2019-03-27 (×2): 3 [IU] via SUBCUTANEOUS

## 2019-03-26 MED ORDER — DEXTROSE 5 % IV SOLN
INTRAVENOUS | Status: AC
  Administered 2019-03-26: 15:00:00 via INTRAVENOUS

## 2019-03-26 MED ORDER — HEPARIN (PORCINE) 25000 UT/250ML-% IV SOLN
1400.0000 [IU]/h | INTRAVENOUS | Status: DC
  Administered 2019-03-26: 1400 [IU]/h via INTRAVENOUS
  Filled 2019-03-26: qty 250

## 2019-03-26 MED ORDER — INSULIN ASPART 100 UNIT/ML ~~LOC~~ SOLN: 0.0000 [IU] | Freq: Every day | SUBCUTANEOUS | Status: DC

## 2019-03-26 MED ORDER — INSULIN GLARGINE 100 UNIT/ML ~~LOC~~ SOLN
10.0000 [IU] | SUBCUTANEOUS | Status: DC
  Administered 2019-03-26 – 2019-03-27 (×2): 10 [IU] via SUBCUTANEOUS
  Filled 2019-03-26 (×3): qty 0.1

## 2019-03-26 MED ORDER — DEXTROSE-NACL 5-0.2 % IV SOLN
INTRAVENOUS | Status: DC
  Administered 2019-03-26: 05:00:00 via INTRAVENOUS

## 2019-03-26 MED ORDER — INSULIN ASPART 100 UNIT/ML ~~LOC~~ SOLN
0.0000 [IU] | SUBCUTANEOUS | Status: DC
  Administered 2019-03-26: 1 [IU] via SUBCUTANEOUS
  Administered 2019-03-27: 2 [IU] via SUBCUTANEOUS
  Administered 2019-03-27: 5 [IU] via SUBCUTANEOUS
  Administered 2019-03-27 (×2): 3 [IU] via SUBCUTANEOUS
  Administered 2019-03-28 – 2019-03-29 (×5): 2 [IU] via SUBCUTANEOUS
  Administered 2019-03-29: 3 [IU] via SUBCUTANEOUS

## 2019-03-26 MED ORDER — ENOXAPARIN SODIUM 40 MG/0.4ML ~~LOC~~ SOLN: 40.0000 mg | Freq: Two times a day (BID) | SUBCUTANEOUS | Status: DC

## 2019-03-26 MED ORDER — HEPARIN BOLUS VIA INFUSION
5000.0000 [IU] | Freq: Once | INTRAVENOUS | Status: AC
  Administered 2019-03-26: 5000 [IU] via INTRAVENOUS
  Filled 2019-03-26: qty 5000

## 2019-03-26 MED ORDER — IOHEXOL 350 MG/ML SOLN
80.0000 mL | Freq: Once | INTRAVENOUS | Status: AC | PRN
  Administered 2019-03-26: 80 mL via INTRAVENOUS

## 2019-03-26 MED ORDER — LACTATED RINGERS IV BOLUS: 500.0000 mL | Freq: Once | INTRAVENOUS | Status: DC

## 2019-03-26 MED ORDER — INSULIN ASPART 100 UNIT/ML ~~LOC~~ SOLN
0.0000 [IU] | Freq: Three times a day (TID) | SUBCUTANEOUS | Status: DC
  Administered 2019-03-26: 3 [IU] via SUBCUTANEOUS

## 2019-03-26 NOTE — Progress Notes (Signed)
ANTICOAGULATION CONSULT NOTE   Pharmacy Consult for Heparin Indication: pulmonary embolus  No Known Allergies  Patient Measurements: Height: 5\' 6"  (167.6 cm) Weight: 175 lb (79.4 kg) IBW/kg (Calculated) : 63.8 Heparin Dosing Weight: 79.4 kg  Vital Signs: BP: 136/98 (11/18 1600) Pulse Rate: 95 (11/18 1530)  Labs: Recent Labs    03/25/19 1554 03/25/19 1909 03/25/19 1947  03/26/19 0418 03/26/19 0640 03/26/19 1117  HGB 14.9 15.1 15.6  --  13.9  --   --   HCT 47.9 47.6 46.0  --  45.7  --   --   PLT 158 156  --   --  139*  --   --   CREATININE 1.81* 1.85*  --    < > 1.43* 1.38* 1.32*   < > = values in this interval not displayed.    Estimated Creatinine Clearance: 40.5 mL/min (A) (by C-G formula based on SCr of 1.32 mg/dL (H)).   Medical History: Past Medical History:  Diagnosis Date  . Asthma    as a child  . Cataract   . Dementia ([place])   . Diabetes mellitus without complication ([place])   . HOH (hard of hearing)   . Hypertension     Medications:  Scheduled:  . cholecalciferol  1,000 Units Oral Daily  . dexamethasone (DECADRON) injection  6 mg Intravenous Q24H  . folic acid  1 mg Oral Daily  . heparin  5,000 Units Intravenous Once  . insulin aspart  0-9 Units Subcutaneous Q4H  . insulin aspart  3 Units Subcutaneous TID WC  . insulin glargine  10 Units Subcutaneous Q24H  . sodium chloride flush  3 mL Intravenous Q12H  . vitamin C  500 mg Oral Daily  . zinc sulfate  220 mg Oral Daily    Assessment: Patient is a 28 yom that has been diagnosed with COVID from his nursing home. The patient had and elevated D-Dimer and was found to have a PE with some evidence of right heart strain. Pharmacy has been asked to dose heparin for this patient.   Goal of Therapy:  Heparin level 0.3-0.7 units/ml Monitor platelets by anticoagulation protocol: Yes   Plan:  - Patient received a dose of [place] Lovenox 30mg  on 11/18 @ ~ 0230 - Will give a small reduction in the bolus due to  Lovenox dose, as patient's renal function has improved since dose.  - Heparin Bolus 5000 units IV x 1 dose  - Followed by Heparin drip @ 1400 units/hr  - Will obtain a heparin level in 8 hours (with am labs) - Monitor for s/s of bleeding and cbc while on heparin   Duanne Limerick PharmD. BCPS  03/26/2019,8:29 PM

## 2019-03-26 NOTE — Progress Notes (Signed)
Inpatient Diabetes Program Recommendations  AACE/ADA: New Consensus Statement on Inpatient Glycemic Control (2015)  Target Ranges:  Prepandial:   less than 140 mg/dL      Peak postprandial:   less than 180 mg/dL (1-2 hours)      Critically ill patients:  140 - 180 mg/dL   Results for SID, GREENER (MRN [id number]) as of 03/26/2019 08:41  Ref. Range 03/25/2019 15:54  Glucose Latest Ref Range: 70 - 99 mg/dL [facility])   Results for WONG, STEADHAM (MRN [id number]) as of 03/26/2019 08:41  Ref. Range 03/25/2019 19:09  Beta-Hydroxybutyric Acid Latest Ref Range: 0.05 - 0.27 mmol/L 0.57 (H)   Results for CHAY, MAZZONI (MRN [id number]) as of 03/26/2019 08:41  Ref. Range 01/17/2019 15:54 03/26/2019 00:50  Hemoglobin A1C Latest Ref Range: 4.8 - 5.6 % 5.9 (A) 8.9 (H)    Admit with: Mild DKA/ COVID+/ Acute Kidney Injury secondary to DKA and Dehydration/ Pneumonia  History: DM, Dementia  SNF DM Meds: Amaryl 2 mg Daily     Kombiglyze XR 09/998 mg Daily     Actos 45 mg Daily     Decadron 6 mg BID (course started 11/14 and supposed to end 11/24 for COVID)  Current Orders: IV Insulin Drip      Getting Decadron 6 mg Daily.    Note 11am BMET shows Anion Gap down to 11 and CO2 level up to 22.  MD has placed orders for transition to SQ Insulin: Lantus 10 units Daily Novolog Sensitive Correction Scale/ SSI (0-9 units) TID AC + HS Novolog 3 units TID with meals for meal coverage    --Will follow patient during hospitalization--  Wyn Quaker RN, MSN, CDE Diabetes Coordinator Inpatient Glycemic Control Team Team Pager: [phone] (8a-5p)

## 2019-03-26 NOTE — ED Notes (Signed)
Daughter given update on pt condition. Daughter would like to speak with Dr about treatment plan. Dr Florene Glen paged

## 2019-03-26 NOTE — Progress Notes (Signed)
PROGRESS NOTE    Tyler Jimenez  ZOX:[id number] DOB: 11-25-1931 DOA: 03/25/2019 PCP: Lyndon Code, MD   Brief Narrative:  Tyler Jimenez is Tyler Jimenez 85 y.o. male with medical history significant of diabetes mellitus type 2 hypertension dementia, nursing home resident, was diagnosed with Covid positive at nursing home.  Was started on Decadron.  Patient has history of diabetes mellitus type 2 is on insulin.  His blood sugar went up to 780.  With anion gap 16 CO2 21.  Same time patient has acute kidney injury secondary to dehydration.  His acidosis is Tyler Jimenez combination of mild DKA and acute kidney injury.  Chest x-ray showed right lower lobe infiltrate Patient was started on Endo tool protocol, given Maxipime for his pneumonia, repeat Covid test. Emergency room physician discussed the case with the family no remdesivir.  Pt initially to be transferred to [facility], but daughter declined this.  Will keep here at [facility] for now.  Assessment & Plan:   Active Problems:   Dementia with behavioral disturbance (HCC)   Essential hypertension   Mixed hyperlipidemia   Uncontrolled type 2 diabetes mellitus with hyperglycemia (HCC)   DKA, type 2 (HCC)   AKI (acute kidney injury) (HCC)   Right lower lobe pulmonary infiltrate   COVID-19   Healthcare-associated pneumonia   COVID-19 virus infection  COVID 19 Pneumonia  Acute Hypoxic Respiratory Failure Continue steroids Pt daughter does not want remdesivir - she's concerned regarding side effects - had long conversation with her regarding this.  Will hold off on remdesivir at this time. Currently stable on 3 L Daily labs - notable for elevated d dimer -> follow up CT PE protocol - will plan for high dose dvt ppx until this has been completed Procalcitonin <0.1, low suspicion for bacterial pneumonia - will d/c cefepime and follow  Elevated D dimer  Concern for VTE:  Currently stable on 3 L.  Start high dose prophylaxis and follow CT PE protocol and LE  [place] to evaluate for DVT.  Start therapeutic anticoagulation if positive work up for DVT.   Mild DKA  T2DM: improved at this time.  Started on lantus and mealtime insulin with SSI.  A1c is 8.9.  Not on insulin at home.   Acute Kidney Injury  Hypernatremia: Baseline creatinine appears about 1.37.  1.85 at peak.  Improving.  Follow with IVF.  Hypernatremia has worsened since admission, start D5W.   Lactic Acidosis: downtrending, will continue to follow  Hypertension:  Amlodipine on hold.  BP reasonable, follow.   Dementia: delirium precautions, follow  DVT prophylaxis: lovenox Code Status: full, confirmed with daughter Family Communication: daughter over phone  Disposition Plan: pending further improvement  Consultants:   none  Procedures:   none  Antimicrobials:  Anti-infectives (From admission, onward)   Start     Dose/Rate Route Frequency Ordered Stop   03/26/19 1930  ceFEPIme (MAXIPIME) 2 g in sodium chloride 0.9 % 100 mL IVPB  Status:  Discontinued     2 g 200 mL/hr over 30 Minutes Intravenous Every 24 hours 03/25/19 2005 03/25/19 2009   03/26/19 0830  ceFEPIme (MAXIPIME) 2 g in sodium chloride 0.9 % 100 mL IVPB  Status:  Discontinued     2 g 200 mL/hr over 30 Minutes Intravenous Every 12 hours 03/25/19 2009 03/26/19 1859   03/25/19 1745  vancomycin (VANCOCIN) 1,500 mg in sodium chloride 0.9 % 500 mL IVPB     1,500 mg 250 mL/hr over 120 Minutes Intravenous  Once  03/25/19 1739 03/25/19 2154   03/25/19 1730  vancomycin (VANCOCIN) IVPB 1000 mg/200 mL premix  Status:  Discontinued     1,000 mg 200 mL/hr over 60 Minutes Intravenous  Once 03/25/19 1727 03/25/19 1739   03/25/19 1730  ceFEPIme (MAXIPIME) 2 g in sodium chloride 0.9 % 100 mL IVPB     2 g 200 mL/hr over 30 Minutes Intravenous  Once 03/25/19 1727 03/25/19 1957     Subjective: Denies complaints Speaks softly confused  Objective: Vitals:   03/26/19 1430 03/26/19 1500 03/26/19 1530 03/26/19 1600  BP: 132/85  136/79 126/69 (!) 136/98  Pulse: 92 93 95   Resp: 15 19 19 15   Temp:      TempSrc:      SpO2: 91% 91% 96%   Weight:      Height:        Intake/Output Summary (Last 24 hours) at 03/26/2019 1845 Last data filed at 03/26/2019 1508 Gross per 24 hour  Intake 137.66 ml  Output -  Net 137.66 ml   Filed Weights   03/25/19 1700  Weight: 79.4 kg    Examination:  General exam: Appears calm and comfortable  Respiratory system: unlabored, no increased WOB Cardiovascular system: RRR Gastrointestinal system: Abdomen is nondistended, soft and nontender.  Central nervous system: Alert and confused. No focal neurological deficits. Extremities: no LEE Skin: No rashes, lesions or ulcers  Data Reviewed: I have personally reviewed following labs and imaging studies  CBC: Recent Labs  Lab 03/25/19 1554 03/25/19 1909 03/25/19 1947 03/26/19 0418  WBC 12.5* 12.6*  --  13.2*  NEUTROABS  --  11.3*  --  11.7*  HGB 14.9 15.1 15.6 13.9  HCT 47.9 47.6 46.0 45.7  MCV 87.9 87.5  --  88.7  PLT 158 156  --  139*   Basic Metabolic Panel: Recent Labs  Lab 03/25/19 1909 03/25/19 1947 03/26/19 0215 03/26/19 0418 03/26/19 0640 03/26/19 1117  NA 149* 150* 156* 157* 156* 156*  K 4.6 4.6 4.4 4.9 4.4 4.6  CL 115*  --  120* 119* 120* 123*  CO2 20*  --  24 24 25 22   GLUCOSE 528*  --  76 163* 215* 182*  BUN 53*  --  43* 41* 40* 36*  CREATININE 1.85*  --  1.36* 1.43* 1.38* 1.32*  CALCIUM 9.8  --  9.7 9.7 9.5 9.5  MG  --   --  2.6* 2.6*  --   --   PHOS  --   --  3.4 4.3  --   --    GFR: Estimated Creatinine Clearance: 40.5 mL/min (Tyler Jimenez) (by C-G formula based on SCr of 1.32 mg/dL (H)). Liver Function Tests: Recent Labs  Lab 03/25/19 1620 03/26/19 0418  AST 38 41  ALT 46* 39  ALKPHOS 56 46  BILITOT 0.9 1.1  PROT 8.1 7.2  ALBUMIN 3.5 2.9*   Recent Labs  Lab 03/25/19 1620  LIPASE 68*   No results for input(s): AMMONIA in the last 168 hours. Coagulation Profile: No results for  input(s): INR, PROTIME in the last 168 hours. Cardiac Enzymes: No results for input(s): CKTOTAL, CKMB, CKMBINDEX, TROPONINI in the last 168 hours. BNP (last 3 results) No results for input(s): PROBNP in the last 8760 hours. HbA1C: Recent Labs    03/26/19 0050  HGBA1C 8.9*   CBG: Recent Labs  Lab 03/26/19 1059 03/26/19 1210 03/26/19 1449 03/26/19 1627 03/26/19 1753  GLUCAP 180* 152* 190* 236* 240*   Lipid Profile: No  results for input(s): CHOL, HDL, LDLCALC, TRIG, CHOLHDL, LDLDIRECT in the last 72 hours. Thyroid Function Tests: No results for input(s): TSH, T4TOTAL, FREET4, T3FREE, THYROIDAB in the last 72 hours. Anemia Panel: Recent Labs    03/26/19 0050 03/26/19 1117  FERRITIN 864* 601*   Sepsis Labs: Recent Labs  Lab 03/25/19 1909 03/26/19 0050  PROCALCITON  --  <0.10  LATICACIDVEN 6.1* 3.1*    Recent Results (from the past 240 hour(s))  SARS CORONAVIRUS 2 (TAT 6-24 HRS) Nasopharyngeal Nasopharyngeal Swab     Status: Abnormal   Collection Time: 03/25/19  5:27 PM   Specimen: Nasopharyngeal Swab  Result Value Ref Range Status   SARS Coronavirus 2 POSITIVE (Bilal Manzer) NEGATIVE Final    Comment: RESULT CALLED TO, READ BACK BY AND VERIFIED WITH: RN Parris Cudworth MCKEOWN @0826  03/26/19 BY S GEZAHEGN (NOTE) SARS-CoV-2 target nucleic acids are DETECTED. The SARS-CoV-2 RNA is generally detectable in upper and lower respiratory specimens during the acute phase of infection. Positive results are indicative of active infection with SARS-CoV-2. Clinical  correlation with patient history and other diagnostic information is necessary to determine patient infection status. Positive results do  not rule out bacterial infection or co-infection with other viruses. The expected result is Negative. Fact Sheet for Patients: HairSlick.nohttps://www.fda.gov/media/138098/download Fact Sheet for Healthcare Providers: quierodirigir.comhttps://www.fda.gov/media/138095/download This test is not yet approved or cleared by the  [place] FDA and  has been authorized for detection and/or diagnosis of SARS-CoV-2 by FDA under an Emergency Use Authorization (EUA). This EUA will remain  in effect (meaning this test can be used)  for the duration of the COVID-19 declaration under Section 564(b)(1) of the Act, 21 U.S.C. section 360bbb-3(b)(1), unless the authorization is terminated or revoked sooner. Performed at [facility], [address] [place], [place] [address]   Blood culture (routine x 2)     Status: None (Preliminary result)   Collection Time: 03/25/19  7:09 PM   Specimen: BLOOD  Result Value Ref Range Status   Specimen Description BLOOD RIGHT ANTECUBITAL  Final   Special Requests   Final    BOTTLES DRAWN AEROBIC AND ANAEROBIC Blood Culture results may not be optimal due to an inadequate volume of blood received in culture bottles   Culture   Final    NO GROWTH < 24 HOURS Performed at [facility], [address] [place], [place] [address]    Report Status PENDING  Incomplete  Blood culture (routine x 2)     Status: None (Preliminary result)   Collection Time: 03/25/19  7:14 PM   Specimen: BLOOD  Result Value Ref Range Status   Specimen Description BLOOD LEFT ANTECUBITAL  Final   Special Requests   Final    BOTTLES DRAWN AEROBIC AND ANAEROBIC Blood Culture adequate volume   Culture   Final    NO GROWTH < 24 HOURS Performed at [facility], [address] [place], [place] [address]    Report Status PENDING  Incomplete         Radiology Studies: Dg Chest Port 1 View  Result Date: 03/25/2019 CLINICAL DATA:  Cough, shortness of breath EXAM: PORTABLE CHEST 1 VIEW COMPARISON:  None. FINDINGS: The heart size and mediastinal contours are within normal limits. Streaky right basilar opacity. No pleural effusion or pneumothorax. The visualized skeletal structures are unremarkable. IMPRESSION: Streaky right basilar opacity which may reflect atelectasis and/or developing  infection. Electronically Signed   By: Duanne GuessNicholas  Plundo M.D.   On: 03/25/2019  16:56        Scheduled Meds: . dexamethasone (DECADRON) injection  6 mg Intravenous Q24H  . enoxaparin (LOVENOX) injection  30 mg Subcutaneous Q24H  . folic acid  1 mg Oral Daily  . insulin aspart  0-9 Units Subcutaneous Q4H  . insulin aspart  3 Units Subcutaneous TID WC  . insulin glargine  10 Units Subcutaneous Q24H  . sodium chloride flush  3 mL Intravenous Q12H  . vitamin C  500 mg Oral Daily  . zinc sulfate  220 mg Oral Daily   Continuous Infusions: . ceFEPime (MAXIPIME) IV Stopped (03/26/19 1022)  . dextrose 100 mL/hr at 03/26/19 1504     LOS: 1 day    Time spent: over 30 min    Fayrene Helper, MD [facility] Pager [email or web address]  If 7PM-7AM, please contact night-coverage [email or web address] Password [facility] 03/26/2019, 6:45 PM

## 2019-03-26 NOTE — ED Notes (Signed)
Ordered diet tray 

## 2019-03-26 NOTE — ED Notes (Signed)
Pt daughter Lavera Guise [phone]

## 2019-03-26 NOTE — ED Notes (Signed)
Pt daughter called concerned about pt going to [place]

## 2019-03-26 NOTE — ED Notes (Signed)
The pt's daughter has called multiple times today asking for an update from admitting Dr. Marcelline Deist RN and [facility] both made aware the daughter has questions and concerns about pt going to [place]. Admitting Dr paged

## 2019-03-27 ENCOUNTER — Inpatient Hospital Stay (HOSPITAL_COMMUNITY): Payer: Medicare Other

## 2019-03-27 DIAGNOSIS — R0602 Shortness of breath: Secondary | ICD-10-CM

## 2019-03-27 DIAGNOSIS — R7989 Other specified abnormal findings of blood chemistry: Secondary | ICD-10-CM

## 2019-03-27 LAB — LACTIC ACID, PLASMA
Lactic Acid, Venous: 2.6 mmol/L (ref 0.5–1.9)
Lactic Acid, Venous: 3.2 mmol/L (ref 0.5–1.9)
Lactic Acid, Venous: 3.8 mmol/L (ref 0.5–1.9)

## 2019-03-27 LAB — CBC WITH DIFFERENTIAL/PLATELET
Abs Immature Granulocytes: 0.13 10*3/uL — ABNORMAL HIGH (ref 0.00–0.07)
Basophils Absolute: 0 10*3/uL (ref 0.0–0.1)
Basophils Relative: 0 %
Eosinophils Absolute: 0 10*3/uL (ref 0.0–0.5)
Eosinophils Relative: 0 %
HCT: 40.3 % (ref 39.0–52.0)
Hemoglobin: 12 g/dL — ABNORMAL LOW (ref 13.0–17.0)
Immature Granulocytes: 1 %
Lymphocytes Relative: 7 %
Lymphs Abs: 0.9 10*3/uL (ref 0.7–4.0)
MCH: 27 pg (ref 26.0–34.0)
MCHC: 29.8 g/dL — ABNORMAL LOW (ref 30.0–36.0)
MCV: 90.8 fL (ref 80.0–100.0)
Monocytes Absolute: 0.5 10*3/uL (ref 0.1–1.0)
Monocytes Relative: 4 %
Neutro Abs: 11.1 10*3/uL — ABNORMAL HIGH (ref 1.7–7.7)
Neutrophils Relative %: 88 %
Platelets: 96 10*3/uL — ABNORMAL LOW (ref 150–400)
RBC: 4.44 MIL/uL (ref 4.22–5.81)
RDW: 14.1 % (ref 11.5–15.5)
WBC: 12.6 10*3/uL — ABNORMAL HIGH (ref 4.0–10.5)
nRBC: 0.6 % — ABNORMAL HIGH (ref 0.0–0.2)

## 2019-03-27 LAB — BASIC METABOLIC PANEL
Anion gap: 10 (ref 5–15)
Anion gap: 13 (ref 5–15)
BUN: 26 mg/dL — ABNORMAL HIGH (ref 8–23)
BUN: 25 mg/dL — ABNORMAL HIGH (ref 8–23)
CO2: 23 mmol/L (ref 22–32)
CO2: 22 mmol/L (ref 22–32)
Calcium: 9 mg/dL (ref 8.9–10.3)
Calcium: 9.2 mg/dL (ref 8.9–10.3)
Chloride: 117 mmol/L — ABNORMAL HIGH (ref 98–111)
Chloride: 113 mmol/L — ABNORMAL HIGH (ref 98–111)
Creatinine, Ser: 1.35 mg/dL — ABNORMAL HIGH (ref 0.61–1.24)
Creatinine, Ser: 1.26 mg/dL — ABNORMAL HIGH (ref 0.61–1.24)
GFR calc Af Amer: 55 mL/min — ABNORMAL LOW (ref >60)
GFR calc Af Amer: 60 mL/min — ABNORMAL LOW (ref >60)
GFR calc non Af Amer: 48 mL/min — ABNORMAL LOW (ref >60)
GFR calc non Af Amer: 52 mL/min — ABNORMAL LOW (ref >60)
Glucose, Bld: 219 mg/dL — ABNORMAL HIGH (ref 70–99)
Glucose, Bld: 331 mg/dL — ABNORMAL HIGH (ref 70–99)
Potassium: 3.9 mmol/L (ref 3.5–5.1)
Potassium: 4.6 mmol/L (ref 3.5–5.1)
Sodium: 150 mmol/L — ABNORMAL HIGH (ref 135–145)
Sodium: 148 mmol/L — ABNORMAL HIGH (ref 135–145)

## 2019-03-27 LAB — CBG MONITORING, ED
Glucose-Capillary: 176 mg/dL — ABNORMAL HIGH (ref 70–99)
Glucose-Capillary: 224 mg/dL — ABNORMAL HIGH (ref 70–99)
Glucose-Capillary: 244 mg/dL — ABNORMAL HIGH (ref 70–99)
Glucose-Capillary: 256 mg/dL — ABNORMAL HIGH (ref 70–99)
Glucose-Capillary: 268 mg/dL — ABNORMAL HIGH (ref 70–99)
Glucose-Capillary: 72 mg/dL (ref 70–99)
Glucose-Capillary: 80 mg/dL (ref 70–99)
Glucose-Capillary: 98 mg/dL (ref 70–99)
Glucose-Capillary: >600 mg/dL (ref 70–99)

## 2019-03-27 LAB — D-DIMER, QUANTITATIVE: D-Dimer, Quant: >20 ug/mL-FEU — ABNORMAL HIGH (ref 0.00–0.50)

## 2019-03-27 LAB — APTT
aPTT: >200 seconds (ref 24–36)
aPTT: 196 seconds (ref 24–36)
aPTT: 193 seconds (ref 24–36)

## 2019-03-27 LAB — COMPREHENSIVE METABOLIC PANEL
ALT: 31 U/L (ref 0–44)
AST: 30 U/L (ref 15–41)
Albumin: 2.4 g/dL — ABNORMAL LOW (ref 3.5–5.0)
Alkaline Phosphatase: 44 U/L (ref 38–126)
Anion gap: 11 (ref 5–15)
BUN: 24 mg/dL — ABNORMAL HIGH (ref 8–23)
CO2: 23 mmol/L (ref 22–32)
Calcium: 8.4 mg/dL — ABNORMAL LOW (ref 8.9–10.3)
Chloride: 99 mmol/L (ref 98–111)
Creatinine, Ser: 1.22 mg/dL (ref 0.61–1.24)
GFR calc Af Amer: >60 mL/min (ref >60)
GFR calc non Af Amer: 54 mL/min — ABNORMAL LOW (ref >60)
Glucose, Bld: 724 mg/dL (ref 70–99)
Potassium: 4.5 mmol/L (ref 3.5–5.1)
Sodium: 133 mmol/L — ABNORMAL LOW (ref 135–145)
Total Bilirubin: 1.1 mg/dL (ref 0.3–1.2)
Total Protein: 5.8 g/dL — ABNORMAL LOW (ref 6.5–8.1)

## 2019-03-27 LAB — CBC
HCT: 46 % (ref 39.0–52.0)
Hemoglobin: 14.3 g/dL (ref 13.0–17.0)
MCH: 27.2 pg (ref 26.0–34.0)
MCHC: 31.1 g/dL (ref 30.0–36.0)
MCV: 87.6 fL (ref 80.0–100.0)
Platelets: 103 K/uL — ABNORMAL LOW (ref 150–400)
RBC: 5.25 MIL/uL (ref 4.22–5.81)
RDW: 13.8 % (ref 11.5–15.5)
WBC: 16.3 K/uL — ABNORMAL HIGH (ref 4.0–10.5)
nRBC: 0 % (ref 0.0–0.2)

## 2019-03-27 LAB — HEPARIN LEVEL (UNFRACTIONATED)
Heparin Unfractionated: 1.09 IU/mL — ABNORMAL HIGH (ref 0.30–0.70)
Heparin Unfractionated: 1.01 IU/mL — ABNORMAL HIGH (ref 0.30–0.70)
Heparin Unfractionated: 1.14 IU/mL — ABNORMAL HIGH (ref 0.30–0.70)

## 2019-03-27 LAB — FERRITIN: Ferritin: 427 ng/mL — ABNORMAL HIGH (ref 24–336)

## 2019-03-27 LAB — C-REACTIVE PROTEIN: CRP: 2.5 mg/dL — ABNORMAL HIGH (ref <1.0)

## 2019-03-27 LAB — ECHOCARDIOGRAM COMPLETE
Height: 66 in
Weight: 2800 oz

## 2019-03-27 LAB — PHOSPHORUS: Phosphorus: 3.7 mg/dL (ref 2.5–4.6)

## 2019-03-27 LAB — MAGNESIUM: Magnesium: 1.9 mg/dL (ref 1.7–2.4)

## 2019-03-27 MED ORDER — HEPARIN (PORCINE) 25000 UT/250ML-% IV SOLN: 1100.0000 [IU]/h | INTRAVENOUS | Status: DC

## 2019-03-27 MED ORDER — FREE WATER
200.0000 mL | Freq: Three times a day (TID) | Status: DC
  Administered 2019-03-27 (×2): 200 mL via ORAL

## 2019-03-27 MED ORDER — LORAZEPAM 2 MG/ML IJ SOLN
1.0000 mg | Freq: Once | INTRAMUSCULAR | Status: AC | PRN
  Administered 2019-03-27: 1 mg via INTRAVENOUS

## 2019-03-27 MED ORDER — SODIUM CHLORIDE 0.9 % IV BOLUS
500.0000 mL | Freq: Once | INTRAVENOUS | Status: AC
  Administered 2019-03-27: 500 mL via INTRAVENOUS

## 2019-03-27 MED ORDER — HEPARIN (PORCINE) 25000 UT/250ML-% IV SOLN
1050.0000 [IU]/h | INTRAVENOUS | Status: AC
  Administered 2019-03-27 – 2019-03-28 (×3): 900 [IU]/h via INTRAVENOUS
  Filled 2019-03-27 (×3): qty 250

## 2019-03-27 MED ORDER — INSULIN GLARGINE 100 UNIT/ML ~~LOC~~ SOLN
15.0000 [IU] | SUBCUTANEOUS | Status: DC
  Filled 2019-03-27: qty 0.15

## 2019-03-27 MED ORDER — LORAZEPAM 2 MG/ML IJ SOLN
0.5000 mg | INTRAMUSCULAR | Status: DC | PRN
  Administered 2019-03-27 – 2019-03-29 (×2): 0.5 mg via INTRAVENOUS
  Filled 2019-03-27 (×3): qty 1

## 2019-03-27 MED ORDER — INSULIN GLARGINE 100 UNIT/ML ~~LOC~~ SOLN
5.0000 [IU] | Freq: Once | SUBCUTANEOUS | Status: AC
  Administered 2019-03-27: 5 [IU] via SUBCUTANEOUS
  Filled 2019-03-27: qty 0.05

## 2019-03-27 MED ORDER — HEPARIN (PORCINE) 25000 UT/250ML-% IV SOLN: 900.0000 [IU]/h | INTRAVENOUS | Status: DC

## 2019-03-27 MED ORDER — LACTATED RINGERS IV BOLUS
500.0000 mL | Freq: Once | INTRAVENOUS | Status: AC
  Administered 2019-03-27: 500 mL via INTRAVENOUS

## 2019-03-27 NOTE — ED Notes (Signed)
Changed pt's linens, gown and adult diaper.

## 2019-03-27 NOTE — ED Notes (Signed)
Changed pt adult diaper and repositioned pt

## 2019-03-27 NOTE — Progress Notes (Addendum)
ANTICOAGULATION CONSULT NOTE   Pharmacy Consult for Heparin Indication: pulmonary embolus  No Known Allergies  Patient Measurements: Height: 5\' 6"  (167.6 cm) Weight: 175 lb (79.4 kg) IBW/kg (Calculated) : 63.8 Heparin Dosing Weight: 79.4 kg  Vital Signs: BP: 116/83 (11/19 1330) Pulse Rate: 89 (11/19 1030)  Labs: Recent Labs    03/26/19 0258 03/26/19 0259 03/26/19 0418  03/26/19 1117 03/27/19 0500 03/27/19 0819 03/27/19 0904 03/27/19 1338  HGB  --   --  13.9  --   --  12.0*  --   --  14.3  HCT  --   --  45.7  --   --  40.3  --   --  46.0  PLT  --   --  139*  --   --  96*  --   --  103*  APTT  --  >200*  --   --   --   --   --  196* 193*  HEPARINUNFRC 1.09*  --   --   --   --  1.01*  --   --  1.14*  CREATININE 1.35*  --  1.43*   < > 1.32* 1.22 1.26*  --   --    < > = values in this interval not displayed.    Estimated Creatinine Clearance: 42.4 mL/min (A) (by C-G formula based on SCr of 1.26 mg/dL (H)).   Medical History: Past Medical History:  Diagnosis Date  . Asthma    as a child  . Cataract   . Dementia ([place])   . Diabetes mellitus without complication ([place])   . HOH (hard of hearing)   . Hypertension     Medications:  Scheduled:  . cholecalciferol  1,000 Units Oral Daily  . dexamethasone (DECADRON) injection  6 mg Intravenous Q24H  . folic acid  1 mg Oral Daily  . free water  200 mL Oral Q8H  . insulin aspart  0-9 Units Subcutaneous Q4H  . insulin aspart  3 Units Subcutaneous TID WC  . [START ON 03/28/2019] insulin glargine  15 Units Subcutaneous Q24H  . sodium chloride flush  3 mL Intravenous Q12H  . vitamin C  500 mg Oral Daily  . zinc sulfate  220 mg Oral Daily    Assessment: Patient is a 75 yom that has been diagnosed with COVID from his nursing home. The patient had and elevated D-Dimer and was found to have a PE with some evidence of right heart strain. Pharmacy has been asked to dose heparin for this patient.   Concerning labs noted -  Heparin level this AM is high at 1.01 after bolus and drip initiation yesterday evening.  Yesterday AM, labs resulted in a prolonged aPTT >200 and elevated heparin level 1.09 about 20 minutes after Lovenox 30mg  SQ was given. Patient was not on anticoagulation prior to admission. Reduced rate and repeated APTT. Repeat APTT still prolonged at 196. Patient is not having any symptoms of bleeding or overt bleeding.  Discussed with Dr. Florene Glen - will recheck levels now and if remain prolonged, would be ok with holding heparin for 30 minutes and then resuming at reduced rate.   Urine clear. No bloody emesis. No bowel movement.   Goal of Therapy:  Heparin level 0.3-0.7 units/ml Monitor platelets by anticoagulation protocol: Yes   Plan:  - Patients aPTT and HL are still elevated and HL actually increased since decreasing the heparin drip.  - Will hold heparin for 1 hour  - Restart heparin  drip @ 900 units/hr - Recheck heparin level and aPTT in 6 hours  - Monitor closely for bleeding and CBC   Joaquim Lai PharmD. BCPS  03/27/2019,3:48 PM

## 2019-03-27 NOTE — Progress Notes (Signed)
  Echocardiogram 2D Echocardiogram has been performed.  Tyler Jimenez 03/27/2019, 10:30 AM

## 2019-03-27 NOTE — ED Notes (Signed)
Please call daughter Governor Specking at [phone] for an update.

## 2019-03-27 NOTE — ED Notes (Signed)
Updated pt's dtr on pts care

## 2019-03-27 NOTE — ED Notes (Signed)
Pt CBG 268

## 2019-03-27 NOTE — ED Notes (Signed)
Changed pt's adult diaper and linens. Pt increasingly confused, pulling at monitors, trying to get out of bed. Notified MD and gave another 1mg  dose of ativan.

## 2019-03-27 NOTE — ED Notes (Signed)
SDU/Covid  Breakfast ordered

## 2019-03-27 NOTE — Progress Notes (Signed)
ANTICOAGULATION CONSULT NOTE   Pharmacy Consult for Heparin Indication: pulmonary embolus  No Known Allergies  Patient Measurements: Height: 5\' 6"  (167.6 cm) Weight: 175 lb (79.4 kg) IBW/kg (Calculated) : 63.8 Heparin Dosing Weight: 79.4 kg  Vital Signs: BP: 130/81 (11/19 0700) Pulse Rate: 94 (11/19 0700)  Labs: Recent Labs    03/25/19 1909 03/25/19 1947  03/26/19 0258 03/26/19 0259 03/26/19 0418 03/26/19 0640 03/26/19 1117 03/27/19 0500  HGB 15.1 15.6  --   --   --  13.9  --   --  12.0*  HCT 47.6 46.0  --   --   --  45.7  --   --  40.3  PLT 156  --   --   --   --  139*  --   --  96*  APTT  --   --   --   --  >200*  --   --   --   --   HEPARINUNFRC  --   --   --  1.09*  --   --   --   --  1.01*  CREATININE 1.85*  --    < > 1.35*  --  1.43* 1.38* 1.32* 1.22   < > = values in this interval not displayed.    Estimated Creatinine Clearance: 43.8 mL/min (by C-G formula based on SCr of 1.22 mg/dL).   Medical History: Past Medical History:  Diagnosis Date  . Asthma    as a child  . Cataract   . Dementia ([place])   . Diabetes mellitus without complication ([place])   . HOH (hard of hearing)   . Hypertension     Medications:  Scheduled:  . cholecalciferol  1,000 Units Oral Daily  . dexamethasone (DECADRON) injection  6 mg Intravenous Q24H  . folic acid  1 mg Oral Daily  . insulin aspart  0-9 Units Subcutaneous Q4H  . insulin aspart  3 Units Subcutaneous TID WC  . insulin glargine  10 Units Subcutaneous Q24H  . sodium chloride flush  3 mL Intravenous Q12H  . vitamin C  500 mg Oral Daily  . zinc sulfate  220 mg Oral Daily    Assessment: Patient is a 65 yom that has been diagnosed with COVID from his nursing home. The patient had and elevated D-Dimer and was found to have a PE with some evidence of right heart strain. Pharmacy has been asked to dose heparin for this patient.   Concerning labs noted - Heparin level this AM is high at 1.01 after bolus and drip  initiation yesterday evening.  Yesterday AM, labs resulted in a prolonged aPTT >200 and elevated heparin level 1.09 about 20 minutes after Lovenox 30mg  SQ was given. Patient was not on anticoagulation prior to admission. Reduced rate and repeated APTT. Repeat APTT still prolonged at 196. Patient is not having any symptoms of bleeding or overt bleeding.  Discussed with Dr. Florene Glen - will recheck levels now and if remain prolonged, would be ok with holding heparin for 30 minutes and then resuming at reduced rate.   Urine clear. No bloody emesis. No bowel movement.   Goal of Therapy:  Heparin level 0.3-0.7 units/ml Monitor platelets by anticoagulation protocol: Yes   Plan:  -Reduce Heparin to 1100 units/hr (done prior with RN). Recheck Heparin level, APPT and CBC Monitor closely for bleeding.   Brain Hilts PharmD. BCPS  03/27/2019,8:30 AM

## 2019-03-27 NOTE — ED Notes (Signed)
Phlebotomy and RN unable to get labs after Multiple Attempts.

## 2019-03-27 NOTE — ED Notes (Signed)
Carb modified dinner tray ordered 

## 2019-03-27 NOTE — ED Notes (Signed)
Stuck pt x3 no blood return nurse notified

## 2019-03-27 NOTE — Progress Notes (Signed)
Inpatient Diabetes Program Recommendations  AACE/ADA: New Consensus Statement on Inpatient Glycemic Control (2015)  Target Ranges:  Prepandial:   less than 140 mg/dL      Peak postprandial:   less than 180 mg/dL (1-2 hours)      Critically ill patients:  140 - 180 mg/dL   Lab Results  Component Value Date   GLUCAP 268 (H) 03/27/2019   HGBA1C 8.9 (H) 03/26/2019    Review of Glycemic Control Results for Tyler Jimenez, Tyler Jimenez (MRN [id number]) as of 03/27/2019 12:58  Ref. Range 03/27/2019 06:29 03/27/2019 07:25 03/27/2019 11:14  Glucose-Capillary Latest Ref Range: 70 - 99 mg/dL 256 (H) 244 (H) 268 (H)   Admit with: Mild DKA/ COVID+/ Acute Kidney Injury secondary to DKA and Dehydration/ Pneumonia  History: DM, Dementia  SNF DM Meds: Amaryl 2 mg Daily                           Kombiglyze XR 09/998 mg Daily                           Actos 45 mg Daily                           Decadron 6 mg BID (course started 11/14 and supposed to end 11/24 for COVID)  Current Orders: Novolog 0-9 units Q4H, Lantus 10 units QD, Novolog 3 units TID  Inpatient Diabetes Program Recommendations:    Consider increasing Lantus 8 units BID. If continuing to eat well, consider changing correction to Novolog 0-9 units TID.   Thanks, Bronson Curb, MSN, RNC-OB Diabetes Coordinator [phone] (8a-5p)

## 2019-03-27 NOTE — Progress Notes (Signed)
Lower extremity venous has been completed.   Preliminary results in CV Proc.   Abram Sander 03/27/2019 10:59 AM

## 2019-03-27 NOTE — Progress Notes (Signed)
PROGRESS NOTE    [name]  ZOX:[id number] DOB: 1932/02/01 DOA: 03/25/2019 PCP: Lyndon Code, MD   Brief Narrative:  Tyler Jimenez is Tyler Jimenez 85 y.o. male with medical history significant of diabetes mellitus type 2 hypertension dementia, nursing home resident, was diagnosed with Covid positive at nursing home.  Was started on Decadron.  Patient has history of diabetes mellitus type 2 is on insulin.  His blood sugar went up to 780.  With anion gap 16 CO2 21.  Same time patient has acute kidney injury secondary to dehydration.  His acidosis is Hope Holst combination of mild DKA and acute kidney injury.  Chest x-ray showed right lower lobe infiltrate Patient was started on Endo tool protocol, given Maxipime for his pneumonia, repeat Covid test. Emergency room physician discussed the case with the family no remdesivir.  Pt initially to be [facility] to [facility], but daughter declined this.  Will keep here at [facility] for now.  Assessment & Plan:   Active Problems:   Dementia with behavioral disturbance (HCC)   Essential hypertension   Mixed hyperlipidemia   Uncontrolled type 2 diabetes mellitus with hyperglycemia (HCC)   DKA, type 2 (HCC)   AKI (acute kidney injury) (HCC)   Right lower lobe pulmonary infiltrate   COVID-19   Healthcare-associated pneumonia   COVID-19 virus infection  COVID 19 Pneumonia   Acute Hypoxic Respiratory Failure   Submassive PE with extensive Right Lower Extremity DVT: Continue steroids Pt daughter does not want remdesivir - she's concerned regarding side effects - had long conversation with her regarding this.  Will hold off on remdesivir at this time. Currently stable on 1 L CT chest with large volume bilateral PE - R heart strain.  Peripheral ground glass opacities. Daily labs - notable for elevated d dimer -> follow up CT PE protocol - will plan for high dose dvt ppx until this has been completed Procalcitonin <0.1, low suspicion for bacterial pneumonia - will  d/c cefepime and follow Continue heparin gtt - supratherapeutic, discussed with pharmacy, appreciate assistance with management Echo demonstrates mildly reduced RV systolic function and concern for mcconnell's sign  LE [place] also shows an extensive right lower extremity DVT extending to right femoral vein Given hemodynamic stability and stable O2 needs, no indication for lytics at this point  COVID-19 Labs  Recent Labs    03/26/19 0050 03/26/19 1117 03/27/19 0500  DDIMER >20.00* >20.00* >20.00*  FERRITIN 864* 601* 427*  LDH 315*  --   --   CRP 4.0* 2.6* 2.5*    Lab Results  Component Value Date   SARSCOV2NAA POSITIVE (Brandt Chaney) 03/25/2019   SARSCOV2NAA NEGATIVE 02/04/2019   Mild DKA   T2DM: improved at this time.  Increase lantus, continue mealtime insulin.  Labs showing glucose of 724 thought to be inaccurate.  Acute Kidney Injury   Hypernatremia: Baseline creatinine appears about 1.37.  1.85 at peak.  Improving to 1.26 today.  Hypernatremia has improved with IVF.  Encourage PO intake.  Encourage PO free water ordered.  Lactic Acidosis: persistent, follow 500 cc bolus.  Hemodynamically stable at this time.  Follow.   Hypertension:  Amlodipine on hold.  BP reasonable, follow.   Dementia: delirium precautions, follow  Thrombocytopenia: Possibly related to viral infection.  follow, he is on heparin at this time, no si/sx bleeding. Of note, this was in same batch of labs as BMP with BG of >700 which was thought to be inaccurate.  Will continue to follow closely.   DVT  prophylaxis: lovenox - heparin gtt Code Status: full, confirmed with daughter 11/18 Family Communication: daughter over phone  Disposition Plan: pending further improvement  Consultants:   none  Procedures:   none  Antimicrobials:  Anti-infectives (From admission, onward)   Start     Dose/Rate Route Frequency Ordered Stop   03/26/19 1930  ceFEPIme (MAXIPIME) 2 g in sodium chloride 0.9 % 100 mL IVPB  Status:   Discontinued     2 g 200 mL/hr over 30 Minutes Intravenous Every 24 hours 03/25/19 2005 03/25/19 2009   03/26/19 0830  ceFEPIme (MAXIPIME) 2 g in sodium chloride 0.9 % 100 mL IVPB  Status:  Discontinued     2 g 200 mL/hr over 30 Minutes Intravenous Every 12 hours 03/25/19 2009 03/26/19 1859   03/25/19 1745  vancomycin (VANCOCIN) 1,500 mg in sodium chloride 0.9 % 500 mL IVPB     1,500 mg 250 mL/hr over 120 Minutes Intravenous  Once 03/25/19 1739 03/25/19 2154   03/25/19 1730  vancomycin (VANCOCIN) IVPB 1000 mg/200 mL premix  Status:  Discontinued     1,000 mg 200 mL/hr over 60 Minutes Intravenous  Once 03/25/19 1727 03/25/19 1739   03/25/19 1730  ceFEPIme (MAXIPIME) 2 g in sodium chloride 0.9 % 100 mL IVPB     2 g 200 mL/hr over 30 Minutes Intravenous  Once 03/25/19 1727 03/25/19 1957     Subjective: Denies complaints today Confused, doesn't know where he is [place]  Objective: Vitals:   03/27/19 0830 03/27/19 0930 03/27/19 1030 03/27/19 1200  BP: (!) 131/92 (!) 119/100 122/82 124/86  Pulse: 94 94 89   Resp: Temp:      TempSrc:      SpO2: 93% 95% 97%   Weight:      Height:        Intake/Output Summary (Last 24 hours) at 03/27/2019 1307 Last data filed at 03/27/2019 0141 Gross per 24 hour  Intake 138.5 ml  Output --  Net 138.5 ml   Filed Weights   03/25/19 1700  Weight: 79.4 kg    Examination:  General: No acute distress. Cardiovascular: RRR Lungs: unlabored, no increased WOB Abdomen: Soft, nontender, nondistended Neurological: Alert and disoriented. Moves all extremities 4. Cranial nerves II through XII grossly intact. Skin: Warm and dry. No rashes or lesions. Extremities: No clubbing or cyanosis. No edema.   Data Reviewed: I have personally reviewed following labs and imaging studies  CBC: Recent Labs  Lab 03/25/19 1554 03/25/19 1909 03/25/19 1947 03/26/19 0418 03/27/19 0500  WBC 12.5* 12.6*  --  13.2* 12.6*  NEUTROABS  --  11.3*  --   11.7* 11.1*  HGB 14.9 15.1 15.6 13.9 12.0*  HCT 47.9 47.6 46.0 45.7 40.3  MCV 87.9 87.5  --  88.7 90.8  PLT 158 156  --  139* 96*   Basic Metabolic Panel: Recent Labs  Lab 03/26/19 0215  03/26/19 0418 03/26/19 0640 03/26/19 1117 03/27/19 0500 03/27/19 0819  NA 156*   < > 157* 156* 156* 133* 148*  K 4.4   < > 4.9 4.4 4.6 4.5 4.6  CL 120*   < > 119* 120* 123* 99 113*  CO2 24   < > GLUCOSE 76   < > 163* 215* 182* 724* 331*  BUN 43*   < > 41* 40* 36* 24* 25*  CREATININE 1.36*   < > 1.43* 1.38* 1.32* 1.22 1.26*  CALCIUM 9.7   < >  9.7 9.5 9.5 8.4* 9.2  MG 2.6*  --  2.6*  --   --  1.9  --   PHOS 3.4  --  4.3  --   --  3.7  --    < > = values in this interval not displayed.   GFR: Estimated Creatinine Clearance: 42.4 mL/min (Maddisen Vought) (by C-G formula based on SCr of 1.26 mg/dL (H)). Liver Function Tests: Recent Labs  Lab 03/25/19 1620 03/26/19 0418 03/27/19 0500  AST 38 41 30  ALT 46* 39 31  ALKPHOS 56 46 44  BILITOT 0.9 1.1 1.1  PROT 8.1 7.2 5.8*  ALBUMIN 3.5 2.9* 2.4*   Recent Labs  Lab 03/25/19 1620  LIPASE 68*   No results for input(s): AMMONIA in the last 168 hours. Coagulation Profile: No results for input(s): INR, PROTIME in the last 168 hours. Cardiac Enzymes: No results for input(s): CKTOTAL, CKMB, CKMBINDEX, TROPONINI in the last 168 hours. BNP (last 3 results) No results for input(s): PROBNP in the last 8760 hours. HbA1C: Recent Labs    03/26/19 0050  HGBA1C 8.9*   CBG: Recent Labs  Lab 03/26/19 2203 03/27/19 0156 03/27/19 0629 03/27/19 0725 03/27/19 1114  GLUCAP 135* 176* 256* 244* 268*   Lipid Profile: No results for input(s): CHOL, HDL, LDLCALC, TRIG, CHOLHDL, LDLDIRECT in the last 72 hours. Thyroid Function Tests: No results for input(s): TSH, T4TOTAL, FREET4, T3FREE, THYROIDAB in the last 72 hours. Anemia Panel: Recent Labs    03/26/19 1117 03/27/19 0500  FERRITIN 601* 427*   Sepsis Labs: Recent Labs  Lab  03/25/19 1909 03/26/19 0050 03/27/19 0327 03/27/19 0819  PROCALCITON  --  <0.10  --   --   LATICACIDVEN 6.1* 3.1* 2.6* 3.2*    Recent Results (from the past 240 hour(s))  SARS CORONAVIRUS 2 (TAT 6-24 HRS) Nasopharyngeal Nasopharyngeal Swab     Status: Abnormal   Collection Time: 03/25/19  5:27 PM   Specimen: Nasopharyngeal Swab  Result Value Ref Range Status   SARS Coronavirus 2 POSITIVE (Tobi Groesbeck) NEGATIVE Final    Comment: RESULT CALLED TO, READ BACK BY AND VERIFIED WITH: RN Sedonia Kitner MCKEOWN @0826  03/26/19 BY S GEZAHEGN (NOTE) SARS-CoV-2 target nucleic acids are DETECTED. The SARS-CoV-2 RNA is generally detectable in upper and lower respiratory specimens during the acute phase of infection. Positive results are indicative of active infection with SARS-CoV-2. Clinical  correlation with patient history and other diagnostic information is necessary to determine patient infection status. Positive results do  not rule out bacterial infection or co-infection with other viruses. The expected result is Negative. Fact Sheet for Patients: 03/28/19 Fact Sheet for Healthcare Providers: HairSlick.no This test is not yet approved or cleared by the quierodirigir.com FDA and  has been authorized for detection and/or diagnosis of SARS-CoV-2 by FDA under an Emergency Use Authorization (EUA). This EUA will remain  in effect (meaning this test can be used)  for the duration of the COVID-19 declaration under Section 564(b)(1) of the Act, 21 U.S.C. section 360bbb-3(b)(1), unless the authorization is terminated or revoked sooner. Performed at [facility], [address], [place], [place]   Blood culture (routine x 2)     Status: None (Preliminary result)   Collection Time: 03/25/19  7:09 PM   Specimen: BLOOD  Result Value Ref Range Status   Specimen Description BLOOD RIGHT ANTECUBITAL  Final   Special Requests   Final    BOTTLES  DRAWN AEROBIC AND ANAEROBIC Blood Culture results may  not be optimal due to an inadequate volume of blood received in culture bottles   Culture   Final    NO GROWTH 2 DAYS Performed at [facility], [address] [place], [place] [address]    Report Status PENDING  Incomplete  Blood culture (routine x 2)     Status: None (Preliminary result)   Collection Time: 03/25/19  7:14 PM   Specimen: BLOOD  Result Value Ref Range Status   Specimen Description BLOOD LEFT ANTECUBITAL  Final   Special Requests   Final    BOTTLES DRAWN AEROBIC AND ANAEROBIC Blood Culture adequate volume   Culture   Final    NO GROWTH 2 DAYS Performed at [facility], [address] [place], [place] [address]    Report Status PENDING  Incomplete         Radiology Studies: Ct Angio Chest Pe W Or Wo Contrast  Addendum Date: 03/26/2019   ADDENDUM REPORT: 03/26/2019 20:22 ADDENDUM: Critical Value/emergent results were called by telephone at the time of interpretation on 03/26/2019 at 8:22 pm to providerDR KIRBY, who verbally acknowledged these results. Electronically Signed   By: Charlett NoseKevin  Dover M.D.   On: 03/26/2019 20:22   Result Date: 03/26/2019 CLINICAL DATA:  Elevated D-dimer. EXAM: CT ANGIOGRAPHY CHEST WITH CONTRAST TECHNIQUE: Multidetector CT imaging of the chest was performed using the standard protocol during bolus administration of intravenous contrast. Multiplanar CT image reconstructions and MIPs were obtained to evaluate the vascular anatomy. CONTRAST:  80mL OMNIPAQUE IOHEXOL 350 MG/ML SOLN COMPARISON:  None. FINDINGS: Cardiovascular: There are bilateral pulmonary emboli noted with large volume, most pronounced in the lower lobes bilaterally, but also seen in the upper lobes. Evidence of right heart strain with RV: LV ratio of 1.36. Coronary artery and aortic calcifications. No evidence of aortic aneurysm. Mediastinum/Nodes: No mediastinal, hilar, or axillary adenopathy. Trachea and esophagus are  unremarkable. Thyroid unremarkable. Lungs/Pleura: Peripheral ground-glass airspace opacities throughout both lungs. While this may be related to the bilateral pulmonary emboli, infection cannot be excluded, particularly atypical/viral infection. No effusions. Upper Abdomen: Imaging into the upper abdomen shows no acute findings. Musculoskeletal: Chest wall soft tissues are unremarkable. No acute bony abnormality. Review of the MIP images confirms the above findings. IMPRESSION: Large volume bilateral pulmonary emboli. CT evidence of right heart strain (RV/LV Ratio = 1.36) consistent with at least submassive (intermediate risk) PE. The presence of right heart strain has been associated with an increased risk of morbidity and mortality. Please activate Code PE by paging [phone]. Peripheral ground-glass airspace opacities within the lungs bilaterally. While this may be related to changes from bilateral pulmonary emboli, cannot exclude infection, particularly atypical/viral infection. Recommend correlation with COVID status. Electronically Signed: By: Charlett NoseKevin  Dover M.D. On: 03/26/2019 19:58   Dg Chest Port 1 View  Result Date: 03/25/2019 CLINICAL DATA:  Cough, shortness of breath EXAM: PORTABLE CHEST 1 VIEW COMPARISON:  None. FINDINGS: The heart size and mediastinal contours are within normal limits. Streaky right basilar opacity. No pleural effusion or pneumothorax. The visualized skeletal structures are unremarkable. IMPRESSION: Streaky right basilar opacity which may reflect atelectasis and/or developing infection. Electronically Signed   By: Duanne GuessNicholas  Plundo M.D.   On: 03/25/2019 16:56   Vas Koreas Lower Extremity Venous (dvt)  Result Date: 03/27/2019  Lower Venous Study Indications: Ddimer.  Comparison Study: no prior Performing Technologist: Blanch MediaMegan Riddle RVS  Examination Guidelines: Dallys Nowakowski complete evaluation includes B-mode imaging, spectral Doppler, color Doppler, and power Doppler as needed  of all  accessible portions of each vessel. Bilateral testing is considered an integral part of Jazelyn Sipe complete examination. Limited examinations for reoccurring indications may be performed as noted.  +---------+---------------+---------+-----------+----------+--------------+  RIGHT     Compressibility Phasicity Spontaneity Properties Thrombus Aging  +---------+---------------+---------+-----------+----------+--------------+  CFV       Full            Yes       Yes                                    +---------+---------------+---------+-----------+----------+--------------+  SFJ       Full                                                             +---------+---------------+---------+-----------+----------+--------------+  FV Prox   Partial                                          Acute           +---------+---------------+---------+-----------+----------+--------------+  FV Mid    None                                             Acute           +---------+---------------+---------+-----------+----------+--------------+  FV Distal None                                             Acute           +---------+---------------+---------+-----------+----------+--------------+  PFV       Full                                                             +---------+---------------+---------+-----------+----------+--------------+  POP       None            No        No                     Acute           +---------+---------------+---------+-----------+----------+--------------+  PTV       None                                             Acute           +---------+---------------+---------+-----------+----------+--------------+  PERO      None  Acute           +---------+---------------+---------+-----------+----------+--------------+   +---------+---------------+---------+-----------+----------+--------------+  LEFT      Compressibility Phasicity Spontaneity Properties Thrombus Aging   +---------+---------------+---------+-----------+----------+--------------+  CFV       Full            Yes       Yes                                    +---------+---------------+---------+-----------+----------+--------------+  SFJ       Full                                                             +---------+---------------+---------+-----------+----------+--------------+  FV Prox   Full                                                             +---------+---------------+---------+-----------+----------+--------------+  FV Mid    Full                                                             +---------+---------------+---------+-----------+----------+--------------+  FV Distal Full                                                             +---------+---------------+---------+-----------+----------+--------------+  PFV       Full                                                             +---------+---------------+---------+-----------+----------+--------------+  POP       None            No        No                     Acute           +---------+---------------+---------+-----------+----------+--------------+  PTV       Full                                                             +---------+---------------+---------+-----------+----------+--------------+  PERO      Full                                                             +---------+---------------+---------+-----------+----------+--------------+  Summary: Right: Findings consistent with acute deep vein thrombosis involving the right femoral vein, right popliteal vein, right posterior tibial veins, and right peroneal veins. No cystic structure found in the popliteal fossa. Left: Findings consistent with acute deep vein thrombosis involving the left popliteal vein. No cystic structure found in the popliteal fossa.  *See table(s) above for measurements and observations.    Preliminary         Scheduled Meds:  cholecalciferol  1,000  Units Oral Daily   dexamethasone (DECADRON) injection  6 mg Intravenous Q24H   folic acid  1 mg Oral Daily   insulin aspart  0-9 Units Subcutaneous Q4H   insulin aspart  3 Units Subcutaneous TID WC   [START ON 03/28/2019] insulin glargine  15 Units Subcutaneous Q24H   insulin glargine  5 Units Subcutaneous Once   sodium chloride flush  3 mL Intravenous Q12H   vitamin C  500 mg Oral Daily   zinc sulfate  220 mg Oral Daily   Continuous Infusions:  heparin 1,100 Units/hr (03/27/19 0837)     LOS: 2 days    Time spent: over 30 min    Lacretia Nicks, MD [facility] Pager [email or web address]  If 7PM-7AM, please contact night-coverage [email or web address] Password [email or web address] 03/27/2019, 1:07 PM

## 2019-03-28 DIAGNOSIS — I2609 Other pulmonary embolism with acute cor pulmonale: Secondary | ICD-10-CM

## 2019-03-28 LAB — COMPREHENSIVE METABOLIC PANEL
ALT: 31 U/L (ref 0–44)
AST: 29 U/L (ref 15–41)
Albumin: 2.5 g/dL — ABNORMAL LOW (ref 3.5–5.0)
Alkaline Phosphatase: 41 U/L (ref 38–126)
Anion gap: 10 (ref 5–15)
BUN: 21 mg/dL (ref 8–23)
CO2: 22 mmol/L (ref 22–32)
Calcium: 8.9 mg/dL (ref 8.9–10.3)
Chloride: 118 mmol/L — ABNORMAL HIGH (ref 98–111)
Creatinine, Ser: 1.15 mg/dL (ref 0.61–1.24)
GFR calc Af Amer: >60 mL/min (ref >60)
GFR calc non Af Amer: 58 mL/min — ABNORMAL LOW (ref >60)
Glucose, Bld: 113 mg/dL — ABNORMAL HIGH (ref 70–99)
Potassium: 3.8 mmol/L (ref 3.5–5.1)
Sodium: 150 mmol/L — ABNORMAL HIGH (ref 135–145)
Total Bilirubin: 0.8 mg/dL (ref 0.3–1.2)
Total Protein: 5.9 g/dL — ABNORMAL LOW (ref 6.5–8.1)

## 2019-03-28 LAB — BASIC METABOLIC PANEL
Anion gap: 9 (ref 5–15)
BUN: 24 mg/dL — ABNORMAL HIGH (ref 8–23)
CO2: 24 mmol/L (ref 22–32)
Calcium: 8.8 mg/dL — ABNORMAL LOW (ref 8.9–10.3)
Chloride: 116 mmol/L — ABNORMAL HIGH (ref 98–111)
Creatinine, Ser: 1.18 mg/dL (ref 0.61–1.24)
GFR calc Af Amer: >60 mL/min (ref >60)
GFR calc non Af Amer: 56 mL/min — ABNORMAL LOW (ref >60)
Glucose, Bld: 205 mg/dL — ABNORMAL HIGH (ref 70–99)
Potassium: 3.9 mmol/L (ref 3.5–5.1)
Sodium: 149 mmol/L — ABNORMAL HIGH (ref 135–145)

## 2019-03-28 LAB — CBC WITH DIFFERENTIAL/PLATELET
Abs Immature Granulocytes: 0.14 10*3/uL — ABNORMAL HIGH (ref 0.00–0.07)
Basophils Absolute: 0 10*3/uL (ref 0.0–0.1)
Basophils Relative: 0 %
Eosinophils Absolute: 0 10*3/uL (ref 0.0–0.5)
Eosinophils Relative: 0 %
HCT: 40.1 % (ref 39.0–52.0)
Hemoglobin: 12.8 g/dL — ABNORMAL LOW (ref 13.0–17.0)
Immature Granulocytes: 1 %
Lymphocytes Relative: 8 %
Lymphs Abs: 1 10*3/uL (ref 0.7–4.0)
MCH: 27.5 pg (ref 26.0–34.0)
MCHC: 31.9 g/dL (ref 30.0–36.0)
MCV: 86.2 fL (ref 80.0–100.0)
Monocytes Absolute: 0.7 10*3/uL (ref 0.1–1.0)
Monocytes Relative: 5 %
Neutro Abs: 11.6 10*3/uL — ABNORMAL HIGH (ref 1.7–7.7)
Neutrophils Relative %: 86 %
Platelets: 102 10*3/uL — ABNORMAL LOW (ref 150–400)
RBC: 4.65 MIL/uL (ref 4.22–5.81)
RDW: 13.9 % (ref 11.5–15.5)
WBC: 13.5 10*3/uL — ABNORMAL HIGH (ref 4.0–10.5)
nRBC: 0.1 % (ref 0.0–0.2)

## 2019-03-28 LAB — GLUCOSE, CAPILLARY
Glucose-Capillary: 162 mg/dL — ABNORMAL HIGH (ref 70–99)
Glucose-Capillary: 189 mg/dL — ABNORMAL HIGH (ref 70–99)
Glucose-Capillary: 114 mg/dL — ABNORMAL HIGH (ref 70–99)
Glucose-Capillary: 181 mg/dL — ABNORMAL HIGH (ref 70–99)

## 2019-03-28 LAB — APTT
aPTT: 110 seconds — ABNORMAL HIGH (ref 24–36)
aPTT: 131 seconds — ABNORMAL HIGH (ref 24–36)

## 2019-03-28 LAB — LACTIC ACID, PLASMA: Lactic Acid, Venous: 2.5 mmol/L (ref 0.5–1.9)

## 2019-03-28 LAB — MRSA PCR SCREENING: MRSA by PCR: NEGATIVE

## 2019-03-28 LAB — PHOSPHORUS: Phosphorus: 3.7 mg/dL (ref 2.5–4.6)

## 2019-03-28 LAB — D-DIMER, QUANTITATIVE: D-Dimer, Quant: >20 ug/mL-FEU — ABNORMAL HIGH (ref 0.00–0.50)

## 2019-03-28 LAB — C-REACTIVE PROTEIN: CRP: 5.6 mg/dL — ABNORMAL HIGH (ref <1.0)

## 2019-03-28 LAB — HEPARIN LEVEL (UNFRACTIONATED)
Heparin Unfractionated: 0.47 IU/mL (ref 0.30–0.70)
Heparin Unfractionated: 0.45 IU/mL (ref 0.30–0.70)

## 2019-03-28 LAB — MAGNESIUM: Magnesium: 2.1 mg/dL (ref 1.7–2.4)

## 2019-03-28 LAB — FERRITIN: Ferritin: 576 ng/mL — ABNORMAL HIGH (ref 24–336)

## 2019-03-28 MED ORDER — SODIUM CHLORIDE 0.9% FLUSH
10.0000 mL | Freq: Two times a day (BID) | INTRAVENOUS | Status: DC
  Administered 2019-03-28 – 2019-04-01 (×3): 10 mL

## 2019-03-28 MED ORDER — SODIUM CHLORIDE 0.9% FLUSH: 10.0000 mL | INTRAVENOUS | Status: DC | PRN

## 2019-03-28 MED ORDER — DEXTROSE 5 % IV SOLN
INTRAVENOUS | Status: AC
  Administered 2019-03-28: 14:00:00 via INTRAVENOUS

## 2019-03-28 MED ORDER — INSULIN GLARGINE 100 UNIT/ML ~~LOC~~ SOLN
7.0000 [IU] | SUBCUTANEOUS | Status: DC
  Administered 2019-03-28 – 2019-04-01 (×5): 7 [IU] via SUBCUTANEOUS
  Filled 2019-03-28 (×6): qty 0.07

## 2019-03-28 MED ORDER — FREE WATER: 200.0000 mL | Freq: Four times a day (QID) | Status: DC

## 2019-03-28 MED ORDER — INSULIN GLARGINE 100 UNIT/ML ~~LOC~~ SOLN: 10.0000 [IU] | SUBCUTANEOUS | Status: DC

## 2019-03-28 NOTE — Plan of Care (Signed)

## 2019-03-28 NOTE — Progress Notes (Signed)
ANTICOAGULATION CONSULT NOTE   Pharmacy Consult for Heparin Indication: pulmonary embolus  No Known Allergies  Patient Measurements: Height: 5\' 6"  (167.6 cm) Weight: 175 lb (79.4 kg) IBW/kg (Calculated) : 63.8 Heparin Dosing Weight: 79.4 kg  Vital Signs: Temp: 98.2 F (36.8 C) (11/20 0037) Temp Source: Oral (11/20 0037) BP: 150/79 (11/20 0037) Pulse Rate: 105 (11/20 0037)  Labs: Recent Labs    03/27/19 0500 03/27/19 0819 03/27/19 0904 03/27/19 1338 03/28/19 0117  HGB 12.0*  --   --  14.3 12.8*  HCT 40.3  --   --  46.0 40.1  PLT 96*  --   --  103* 102*  APTT  --   --  196* 193* 110*  HEPARINUNFRC 1.01*  --   --  1.14* 0.47  CREATININE 1.22 1.26*  --   --  1.15    Estimated Creatinine Clearance: 46.5 mL/min (by C-G formula based on SCr of 1.15 mg/dL).   Medical History: Past Medical History:  Diagnosis Date  . Asthma    as a child  . Cataract   . Dementia ([place])   . Diabetes mellitus without complication ([place])   . HOH (hard of hearing)   . Hypertension     Medications:  Scheduled:  . cholecalciferol  1,000 Units Oral Daily  . dexamethasone (DECADRON) injection  6 mg Intravenous Q24H  . folic acid  1 mg Oral Daily  . free water  200 mL Oral Q8H  . insulin aspart  0-9 Units Subcutaneous Q4H  . insulin aspart  3 Units Subcutaneous TID WC  . insulin glargine  15 Units Subcutaneous Q24H  . sodium chloride flush  3 mL Intravenous Q12H  . vitamin C  500 mg Oral Daily  . zinc sulfate  220 mg Oral Daily    Assessment: Patient is a 48 yom that has been diagnosed with COVID from his nursing home. The patient had and elevated D-Dimer and was found to have a PE with some evidence of right heart strain. Pharmacy has been asked to dose heparin for this patient.   Concerning labs noted - Heparin level this AM is high at 1.01 after bolus and drip initiation yesterday evening.  Yesterday AM, labs resulted in a prolonged aPTT >200 and elevated heparin level 1.09 about  20 minutes after Lovenox 30mg  SQ was given. Patient was not on anticoagulation prior to admission. Reduced rate and repeated APTT. Repeat APTT still prolonged at 196. Patient is not having any symptoms of bleeding or overt bleeding.  Discussed with Dr. Florene Glen - will recheck levels now and if remain prolonged, would be ok with holding heparin for 30 minutes and then resuming at reduced rate.    11/20 AM update:  Heparin level is therapeutic x 1 after rate decrease  Goal of Therapy:  Heparin level 0.3-0.7 units/ml Monitor platelets by anticoagulation protocol: Yes   Plan:  -Cont heparin at 900 units/hr -Confirmatory heparin level at [place], PharmD, [place] Pharmacist Phone: [phone]

## 2019-03-28 NOTE — [place] Notes (Signed)
PROGRESS NOTE    Tyler Jimenez  KGU:[id number] DOB: 04/17/1932 DOA: 03/25/2019 PCP: Lavera Guise, MD   Brief Narrative:  Tyler Jimenez is Tyler Jimenez 85 y.o. male with medical history significant of diabetes mellitus type 2 hypertension dementia, nursing home resident, was diagnosed with Covid positive at nursing home.  Was started on Decadron.  Patient has history of diabetes mellitus type 2 is on insulin.  His blood sugar went up to 780.  With anion gap 16 CO2 21.  Same time patient has acute kidney injury secondary to dehydration.  His acidosis is Hussien Greenblatt combination of mild DKA and acute kidney injury.  Chest x-ray showed right lower lobe infiltrate Patient was started on Endo tool protocol, given Maxipime for his pneumonia, repeat Covid test. Emergency room physician discussed the case with the family no remdesivir.  Pt initially to be transferred to [facility], but daughter declined this.  Will keep here at [facility] for now.  Assessment & Plan:   Active Problems:   Dementia with behavioral disturbance ([place])   Essential hypertension   Mixed hyperlipidemia   Uncontrolled type 2 diabetes mellitus with hyperglycemia ([place])   DKA, type 2 ([place])   AKI (acute kidney injury) ([place])   Right lower lobe pulmonary infiltrate   COVID-19   Healthcare-associated pneumonia   COVID-19 virus infection  COVID 19 Pneumonia   Acute Hypoxic Respiratory Failure   Submassive PE with extensive Right Lower Extremity DVT: Continue steroids (dexamethasone day 4) Pt daughter does not want remdesivir - she's concerned regarding side effects - had long conversation with her regarding this.  Will hold off on remdesivir at this time. Currently stable on 1 L CT chest with large volume bilateral PE - R heart strain.  Peripheral ground glass opacities. Daily labs - notable for elevated d dimer, ferritin, CRP risig Procalcitonin <0.1, low suspicion for bacterial pneumonia - will d/c cefepime and follow Continue heparin gtt  per pharmacy (plan for at least 3 days of IV heparin with submassive PE and extensive RLE DVT 11/18 - present) Echo demonstrates mildly reduced RV systolic function and concern for mcconnell's sign  LE [place] also shows an extensive right lower extremity DVT extending to right femoral vein Given hemodynamic stability and stable O2 needs, no indication for lytics at this point (he'd be high risk given his age)  COVID-19 Labs  Recent Labs    03/26/19 0050 03/26/19 1117 03/27/19 0500 03/28/19 0117  DDIMER >20.00* >20.00* >20.00* >20.00*  FERRITIN 864* 601* 427* 576*  LDH 315*  --   --   --   CRP 4.0* 2.6* 2.5* 5.6*    Lab Results  Component Value Date   SARSCOV2NAA POSITIVE ([place]) 03/25/2019   [place] NEGATIVE 02/04/2019   Mild DKA   T2DM: improved at this time.  Decrease basal insulin with BG <100 - continue lantus 7 units.  Continue SSI.  Acute Kidney Injury   Hypernatremia: Baseline creatinine appears about 1.37.  1.85 at peak.  Improving to 1.15 today.   D5W ordered x 12 hrs today Encourage PO intake and free water  Lactic Acidosis: improved, but not resolved.  Pt appears stable.  Will stop following at this time.    Hypertension:  Amlodipine on hold.  BP reasonable, follow.   Dementia: delirium precautions, follow - he's impulsive - sitter ordered.  Ativan prn agitation.  QTc prolonged, precludes antipsychotics.    Thrombocytopenia: Possibly related to viral infection.  follow, he is on heparin at this time, no si/sx  bleeding. Stable.  Follow.   DVT prophylaxis: lovenox - heparin gtt Code Status: full, confirmed with daughter 11/18 Family Communication: daughter over phone 11/19 Disposition Plan: pending further improvement  Consultants:   none  Procedures:   none  Antimicrobials:  Anti-infectives (From admission, onward)   Start     Dose/Rate Route Frequency Ordered Stop   03/26/19 1930  ceFEPIme (MAXIPIME) 2 g in sodium chloride 0.9 % 100 mL IVPB  Status:   Discontinued     2 g 200 mL/hr over 30 Minutes Intravenous Every 24 hours 03/25/19 2005 03/25/19 2009   03/26/19 0830  ceFEPIme (MAXIPIME) 2 g in sodium chloride 0.9 % 100 mL IVPB  Status:  Discontinued     2 g 200 mL/hr over 30 Minutes Intravenous Every 12 hours 03/25/19 2009 03/26/19 1859   03/25/19 1745  vancomycin (VANCOCIN) 1,500 mg in sodium chloride 0.9 % 500 mL IVPB     1,500 mg 250 mL/hr over 120 Minutes Intravenous  Once 03/25/19 1739 03/25/19 2154   03/25/19 1730  vancomycin (VANCOCIN) IVPB 1000 mg/200 mL premix  Status:  Discontinued     1,000 mg 200 mL/hr over 60 Minutes Intravenous  Once 03/25/19 1727 03/25/19 1739   03/25/19 1730  ceFEPIme (MAXIPIME) 2 g in sodium chloride 0.9 % 100 mL IVPB     2 g 200 mL/hr over 30 Minutes Intravenous  Once 03/25/19 1727 03/25/19 1957     Subjective: Confused.  No complaints.  Objective: Vitals:   03/28/19 0533 03/28/19 0626 03/28/19 0710 03/28/19 1125  BP:  122/75 122/76 (!) 142/104  Pulse:   97 100  Resp: 20 (!) Temp:   97.7 F (36.5 C) 98.1 F (36.7 C)  TempSrc:   Axillary Axillary  SpO2: 100% 95% 96% 98%  Weight:      Height:        Intake/Output Summary (Last 24 hours) at 03/28/2019 1522 Last data filed at 03/28/2019 1500 Gross per 24 hour  Intake 1258.52 ml  Output --  Net 1258.52 ml   Filed Weights   03/25/19 1700  Weight: 79.4 kg    Examination:  General: No acute distress.  Sitting up at edge of bed. Cardiovascular: RRR Lungs: unlabored. Abdomen: Soft, nontender, nondistended  Neurological: Alert and disoriented. Moves all extremities 4 . Cranial nerves II through XII grossly intact. Skin: Warm and dry. No rashes or lesions. Extremities: No clubbing or cyanosis. No edema.   Data Reviewed: I have personally reviewed following labs and imaging studies  CBC: Recent Labs  Lab 03/25/19 1909 03/25/19 1947 03/26/19 0418 03/27/19 0500 03/27/19 1338 03/28/19 0117  WBC 12.6*  --  13.2*  12.6* 16.3* 13.5*  NEUTROABS 11.3*  --  11.7* 11.1*  --  11.6*  HGB 15.1 15.6 13.9 12.0* 14.3 12.8*  HCT 47.6 46.0 45.7 40.3 46.0 40.1  MCV 87.5  --  88.7 90.8 87.6 86.2  PLT 156  --  139* 96* 103* 102*   Basic Metabolic Panel: Recent Labs  Lab 03/26/19 0215  03/26/19 0418 03/26/19 0640 03/26/19 1117 03/27/19 0500 03/27/19 0819 03/28/19 0117  NA 156*   < > 157* 156* 156* 133* 148* 150*  K 4.4   < > 4.9 4.4 4.6 4.5 4.6 3.8  CL 120*   < > 119* 120* 123* 99 113* 118*  CO2 24   < > GLUCOSE 76   < > 163* 215* 182* 724* 331*  113*  BUN 43*   < > 41* 40* 36* 24* 25* 21  CREATININE 1.36*   < > 1.43* 1.38* 1.32* 1.22 1.26* 1.15  CALCIUM 9.7   < > 9.7 9.5 9.5 8.4* 9.2 8.9  MG 2.6*  --  2.6*  --   --  1.9  --  2.1  PHOS 3.4  --  4.3  --   --  3.7  --  3.7   < > = values in this interval not displayed.   GFR: Estimated Creatinine Clearance: 46.5 mL/min (by C-G formula based on SCr of 1.15 mg/dL). Liver Function Tests: Recent Labs  Lab 03/25/19 1620 03/26/19 0418 03/27/19 0500 03/28/19 0117  AST 38 41 30 29  ALT 46* 39 31 31  ALKPHOS 56 46 44 41  BILITOT 0.9 1.1 1.1 0.8  PROT 8.1 7.2 5.8* 5.9*  ALBUMIN 3.5 2.9* 2.4* 2.5*   Recent Labs  Lab 03/25/19 1620  LIPASE 68*   No results for input(s): AMMONIA in the last 168 hours. Coagulation Profile: No results for input(s): INR, PROTIME in the last 168 hours. Cardiac Enzymes: No results for input(s): CKTOTAL, CKMB, CKMBINDEX, TROPONINI in the last 168 hours. BNP (last 3 results) No results for input(s): PROBNP in the last 8760 hours. HbA1C: Recent Labs    03/26/19 0050  HGBA1C 8.9*   CBG: Recent Labs  Lab 03/27/19 1931 03/27/19 2307 03/27/19 2354 03/28/19 0742 03/28/19 1124  GLUCAP 98 72 80 162* 189*   Lipid Profile: No results for input(s): CHOL, HDL, LDLCALC, TRIG, CHOLHDL, LDLDIRECT in the last 72 hours. Thyroid Function Tests: No results for input(s): TSH, T4TOTAL, FREET4, T3FREE,  THYROIDAB in the last 72 hours. Anemia Panel: Recent Labs    03/27/19 0500 03/28/19 0117  FERRITIN 427* 576*   Sepsis Labs: Recent Labs  Lab 03/26/19 0050 03/27/19 0327 03/27/19 0819 03/27/19 1338 03/28/19 0117  PROCALCITON <0.10  --   --   --   --   LATICACIDVEN 3.1* 2.6* 3.2* 3.8* 2.5*    Recent Results (from the past 240 hour(s))  SARS CORONAVIRUS 2 (TAT 6-24 HRS) Nasopharyngeal Nasopharyngeal Swab     Status: Abnormal   Collection Time: 03/25/19  5:27 PM   Specimen: Nasopharyngeal Swab  Result Value Ref Range Status   SARS Coronavirus 2 POSITIVE ([place]) NEGATIVE Final    Comment: RESULT CALLED TO, READ BACK BY AND VERIFIED WITH: RN Jeannette Maddy MCKEOWN @0826  03/26/19 BY S GEZAHEGN (NOTE) SARS-CoV-2 target nucleic acids are DETECTED. The SARS-CoV-2 RNA is generally detectable in upper and lower respiratory specimens during the acute phase of infection. Positive results are indicative of active infection with SARS-CoV-2. Clinical  correlation with patient history and other diagnostic information is necessary to determine patient infection status. Positive results do  not rule out bacterial infection or co-infection with other viruses. The expected result is Negative. Fact Sheet for Patients: 03/28/19 Fact Sheet for Healthcare Providers: HairSlick.no This test is not yet approved or cleared by the quierodirigir.com FDA and  has been authorized for detection and/or diagnosis of SARS-CoV-2 by FDA under an Emergency Use Authorization (EUA). This EUA will remain  in effect (meaning this test can be used)  for the duration of the COVID-19 declaration under Section 564(b)(1) of the Act, 21 U.S.C. section 360bbb-3(b)(1), unless the authorization is terminated or revoked sooner. Performed at [facility], [address] [place], [place]   Blood culture (routine x 2)     Status: None (  Preliminary result)    Collection Time: 03/25/19  7:09 PM   Specimen: BLOOD  Result Value Ref Range Status   Specimen Description BLOOD RIGHT ANTECUBITAL  Final   Special Requests   Final    BOTTLES DRAWN AEROBIC AND ANAEROBIC Blood Culture results may not be optimal due to an inadequate volume of blood received in culture bottles   Culture   Final    NO GROWTH 3 DAYS Performed at [facility], [address] [place], [place] [address]    Report Status PENDING  Incomplete  Blood culture (routine x 2)     Status: None (Preliminary result)   Collection Time: 03/25/19  7:14 PM   Specimen: BLOOD  Result Value Ref Range Status   Specimen Description BLOOD LEFT ANTECUBITAL  Final   Special Requests   Final    BOTTLES DRAWN AEROBIC AND ANAEROBIC Blood Culture adequate volume   Culture   Final    NO GROWTH 3 DAYS Performed at [facility], [address] [place], [place] [address]    Report Status PENDING  Incomplete  MRSA PCR Screening     Status: None   Collection Time: 03/28/19 12:58 AM   Specimen: Nasopharyngeal  Result Value Ref Range Status   MRSA by PCR NEGATIVE NEGATIVE Final    Comment:        The GeneXpert MRSA Assay (FDA approved for NASAL specimens only), is one component of Machel Violante comprehensive MRSA colonization surveillance program. It is not intended to diagnose MRSA infection nor to guide or monitor treatment for MRSA infections. Performed at [facility], [address], [place], [place] [address]          Radiology Studies: Ct Angio Chest Pe W Or Wo Contrast  Addendum Date: 03/26/2019   ADDENDUM REPORT: 03/26/2019 20:22 ADDENDUM: Critical Value/emergent results were called by telephone at the time of interpretation on 03/26/2019 at 8:22 pm to providerDR KIRBY, who verbally acknowledged these results. Electronically Signed   By: Charlett Nose M.D.   On: 03/26/2019 20:22   Result Date: 03/26/2019 CLINICAL DATA:  Elevated D-dimer. EXAM: CT ANGIOGRAPHY CHEST WITH  CONTRAST TECHNIQUE: Multidetector CT imaging of the chest was performed using the standard protocol during bolus administration of intravenous contrast. Multiplanar CT image reconstructions and MIPs were obtained to evaluate the vascular anatomy. CONTRAST:  80mL OMNIPAQUE IOHEXOL 350 MG/ML SOLN COMPARISON:  None. FINDINGS: Cardiovascular: There are bilateral pulmonary emboli noted with large volume, most pronounced in the lower lobes bilaterally, but also seen in the upper lobes. Evidence of right heart strain with RV: LV ratio of 1.36. Coronary artery and aortic calcifications. No evidence of aortic aneurysm. Mediastinum/Nodes: No mediastinal, hilar, or axillary adenopathy. Trachea and esophagus are unremarkable. Thyroid unremarkable. Lungs/Pleura: Peripheral ground-glass airspace opacities throughout both lungs. While this may be related to the bilateral pulmonary emboli, infection cannot be excluded, particularly atypical/viral infection. No effusions. Upper Abdomen: Imaging into the upper abdomen shows no acute findings. Musculoskeletal: Chest wall soft tissues are unremarkable. No acute bony abnormality. Review of the MIP images confirms the above findings. IMPRESSION: Large volume bilateral pulmonary emboli. CT evidence of right heart strain (RV/LV Ratio = 1.36) consistent with at least submassive (intermediate risk) PE. The presence of right heart strain has been associated with an increased risk of morbidity and mortality. Please activate Code PE by paging [phone]. Peripheral ground-glass airspace opacities within the lungs bilaterally. While this may be related to changes from bilateral pulmonary  emboli, cannot exclude infection, particularly atypical/viral infection. Recommend correlation with COVID status. Electronically Signed: By: Charlett Nose M.D. On: 03/26/2019 19:58   Vas [place] Lower Extremity Venous (dvt)  Result Date: 03/27/2019  Lower Venous Study Indications: Ddimer.  Comparison Study: no  prior Performing Technologist: Blanch Media RVS  Examination Guidelines: Keionte Swicegood complete evaluation includes B-mode imaging, spectral Doppler, color Doppler, and power Doppler as needed of all accessible portions of each vessel. Bilateral testing is considered an integral part of Maziah Smola complete examination. Limited examinations for reoccurring indications may be performed as noted.  +---------+---------------+---------+-----------+----------+--------------+  RIGHT     Compressibility Phasicity Spontaneity Properties Thrombus Aging  +---------+---------------+---------+-----------+----------+--------------+  CFV       Full            Yes       Yes                                    +---------+---------------+---------+-----------+----------+--------------+  SFJ       Full                                                             +---------+---------------+---------+-----------+----------+--------------+  FV Prox   Partial                                          Acute           +---------+---------------+---------+-----------+----------+--------------+  FV Mid    None                                             Acute           +---------+---------------+---------+-----------+----------+--------------+  FV Distal None                                             Acute           +---------+---------------+---------+-----------+----------+--------------+  PFV       Full                                                             +---------+---------------+---------+-----------+----------+--------------+  POP       None            No        No                     Acute           +---------+---------------+---------+-----------+----------+--------------+  PTV       None  Acute           +---------+---------------+---------+-----------+----------+--------------+  PERO      None                                             Acute            +---------+---------------+---------+-----------+----------+--------------+   +---------+---------------+---------+-----------+----------+--------------+  LEFT      Compressibility Phasicity Spontaneity Properties Thrombus Aging  +---------+---------------+---------+-----------+----------+--------------+  CFV       Full            Yes       Yes                                    +---------+---------------+---------+-----------+----------+--------------+  SFJ       Full                                                             +---------+---------------+---------+-----------+----------+--------------+  FV Prox   Full                                                             +---------+---------------+---------+-----------+----------+--------------+  FV Mid    Full                                                             +---------+---------------+---------+-----------+----------+--------------+  FV Distal Full                                                             +---------+---------------+---------+-----------+----------+--------------+  PFV       Full                                                             +---------+---------------+---------+-----------+----------+--------------+  POP       None            No        No                     Acute           +---------+---------------+---------+-----------+----------+--------------+  PTV       Full                                                             +---------+---------------+---------+-----------+----------+--------------+  PERO      Full                                                             +---------+---------------+---------+-----------+----------+--------------+     Summary: Right: Findings consistent with acute deep vein thrombosis involving the right femoral vein, right popliteal vein, right posterior tibial veins, and right peroneal veins. No cystic structure found in the popliteal fossa. Left: Findings consistent with acute deep  vein thrombosis involving the left popliteal vein. No cystic structure found in the popliteal fossa.  *See table(s) above for measurements and observations. Electronically signed by Sherald Hesshristopher Clark MD on 03/27/2019 at 7:14:06 PM.    Final         Scheduled Meds:  cholecalciferol  1,000 Units Oral Daily   dexamethasone (DECADRON) injection  6 mg Intravenous Q24H   folic acid  1 mg Oral Daily   insulin aspart  0-9 Units Subcutaneous Q4H   insulin glargine  7 Units Subcutaneous Q24H   sodium chloride flush  10-40 mL Intracatheter Q12H   sodium chloride flush  3 mL Intravenous Q12H   vitamin C  500 mg Oral Daily   zinc sulfate  220 mg Oral Daily   Continuous Infusions:  dextrose 100 mL/hr at 03/28/19 1422   heparin 900 Units/hr (03/27/19 1804)     LOS: 3 days    Time spent: over 30 min    Lacretia Nicksaldwell Powell, MD [facility] Pager [email or web address]  If 7PM-7AM, please contact night-coverage [email or web address] Password [email or web address] 03/28/2019, 3:22 PM

## 2019-03-28 NOTE — [place] Notes (Signed)
ANTICOAGULATION CONSULT NOTE   Pharmacy Consult for Heparin Indication: pulmonary embolus  No Known Allergies  Patient Measurements: Height: 5\' 6"  (167.6 cm) Weight: 175 lb (79.4 kg) IBW/kg (Calculated) : 63.8 Heparin Dosing Weight: 79.4 kg  Vital Signs: Temp: 98.1 F (36.7 C) (11/20 1125) Temp Source: Axillary (11/20 1125) BP: 142/104 (11/20 1125) Pulse Rate: 100 (11/20 1125)  Labs: Recent Labs    03/27/19 0500 03/27/19 0819  03/27/19 1338 03/28/19 0117 03/28/19 1514 03/28/19 1540  HGB 12.0*  --   --  14.3 12.8*  --   --   HCT 40.3  --   --  46.0 40.1  --   --   PLT 96*  --   --  103* 102*  --   --   APTT  --   --    < > 193* 110* 131*  --   HEPARINUNFRC 1.01*  --   --  1.14* 0.47  --  0.45  CREATININE 1.22 1.26*  --   --  1.15 1.18  --    < > = values in this interval not displayed.    Estimated Creatinine Clearance: 45.3 mL/min (by C-G formula based on SCr of 1.18 mg/dL).   Medical History: Past Medical History:  Diagnosis Date  . Asthma    as a child  . Cataract   . Dementia ([place])   . Diabetes mellitus without complication ([place])   . HOH (hard of hearing)   . Hypertension     Medications:  Scheduled:  . cholecalciferol  1,000 Units Oral Daily  . dexamethasone (DECADRON) injection  6 mg Intravenous Q24H  . folic acid  1 mg Oral Daily  . insulin aspart  0-9 Units Subcutaneous Q4H  . insulin glargine  7 Units Subcutaneous Q24H  . sodium chloride flush  10-40 mL Intracatheter Q12H  . sodium chloride flush  3 mL Intravenous Q12H  . vitamin C  500 mg Oral Daily  . zinc sulfate  220 mg Oral Daily    Assessment: Patient is a 89 yom that has been diagnosed with COVID from his nursing home. The patient had and elevated D-Dimer and was found to have a PE with some evidence of right heart strain. Pharmacy has been asked to dose heparin for this patient.  -heparin level= 0.45 (last was 0.47 on the same heparin rate, aPTT= 131  APTT is likely not reliable.    Goal of Therapy:  Heparin level 0.3-0.7 units/ml Monitor platelets by anticoagulation protocol: Yes   Plan:  -Cont heparin at 900 units/hr -Daily heparin level and CBC  Hildred Laser, PharmD Clinical Pharmacist **Pharmacist phone directory can now be found on [email or web address] (PW [email or web address]).  Listed under [place].

## 2019-03-28 NOTE — [place] Notes (Signed)
CRITICAL VALUE ALERT  Critical Value:  Lactic Acid 2.5  Provider Notified: NP [place],  02:57 03/28/2019  Orders Received/Actions taken: No new orders received at this time, will continue to monitor pt.

## 2019-03-29 DIAGNOSIS — I82409 Acute embolism and thrombosis of unspecified deep veins of unspecified lower extremity: Secondary | ICD-10-CM

## 2019-03-29 LAB — CBC WITH DIFFERENTIAL/PLATELET
Abs Immature Granulocytes: 0.11 10*3/uL — ABNORMAL HIGH (ref 0.00–0.07)
Basophils Absolute: 0 10*3/uL (ref 0.0–0.1)
Basophils Relative: 0 %
Eosinophils Absolute: 0.1 10*3/uL (ref 0.0–0.5)
Eosinophils Relative: 1 %
HCT: 42.3 % (ref 39.0–52.0)
Hemoglobin: 13.2 g/dL (ref 13.0–17.0)
Immature Granulocytes: 1 %
Lymphocytes Relative: 17 %
Lymphs Abs: 1.6 10*3/uL (ref 0.7–4.0)
MCH: 27.3 pg (ref 26.0–34.0)
MCHC: 31.2 g/dL (ref 30.0–36.0)
MCV: 87.6 fL (ref 80.0–100.0)
Monocytes Absolute: 0.7 10*3/uL (ref 0.1–1.0)
Monocytes Relative: 7 %
Neutro Abs: 7.2 10*3/uL (ref 1.7–7.7)
Neutrophils Relative %: 74 %
Platelets: 115 10*3/uL — ABNORMAL LOW (ref 150–400)
RBC: 4.83 MIL/uL (ref 4.22–5.81)
RDW: 14.1 % (ref 11.5–15.5)
WBC: 9.7 10*3/uL (ref 4.0–10.5)
nRBC: 0 % (ref 0.0–0.2)

## 2019-03-29 LAB — FERRITIN: Ferritin: 566 ng/mL — ABNORMAL HIGH (ref 24–336)

## 2019-03-29 LAB — COMPREHENSIVE METABOLIC PANEL
ALT: 27 U/L (ref 0–44)
AST: 20 U/L (ref 15–41)
Albumin: 2.4 g/dL — ABNORMAL LOW (ref 3.5–5.0)
Alkaline Phosphatase: 42 U/L (ref 38–126)
Anion gap: 12 (ref 5–15)
BUN: 19 mg/dL (ref 8–23)
CO2: 22 mmol/L (ref 22–32)
Calcium: 8.7 mg/dL — ABNORMAL LOW (ref 8.9–10.3)
Chloride: 114 mmol/L — ABNORMAL HIGH (ref 98–111)
Creatinine, Ser: 1.14 mg/dL (ref 0.61–1.24)
GFR calc Af Amer: >60 mL/min (ref >60)
GFR calc non Af Amer: 58 mL/min — ABNORMAL LOW (ref >60)
Glucose, Bld: 198 mg/dL — ABNORMAL HIGH (ref 70–99)
Potassium: 3.5 mmol/L (ref 3.5–5.1)
Sodium: 148 mmol/L — ABNORMAL HIGH (ref 135–145)
Total Bilirubin: 0.7 mg/dL (ref 0.3–1.2)
Total Protein: 6.3 g/dL — ABNORMAL LOW (ref 6.5–8.1)

## 2019-03-29 LAB — HEPARIN LEVEL (UNFRACTIONATED): Heparin Unfractionated: 0.41 IU/mL (ref 0.30–0.70)

## 2019-03-29 LAB — C-REACTIVE PROTEIN: CRP: 7.2 mg/dL — ABNORMAL HIGH (ref <1.0)

## 2019-03-29 LAB — PHOSPHORUS: Phosphorus: 3.8 mg/dL (ref 2.5–4.6)

## 2019-03-29 LAB — GLUCOSE, CAPILLARY
Glucose-Capillary: 204 mg/dL — ABNORMAL HIGH (ref 70–99)
Glucose-Capillary: 183 mg/dL — ABNORMAL HIGH (ref 70–99)
Glucose-Capillary: 170 mg/dL — ABNORMAL HIGH (ref 70–99)
Glucose-Capillary: 220 mg/dL — ABNORMAL HIGH (ref 70–99)
Glucose-Capillary: 257 mg/dL — ABNORMAL HIGH (ref 70–99)
Glucose-Capillary: 97 mg/dL (ref 70–99)

## 2019-03-29 LAB — D-DIMER, QUANTITATIVE: D-Dimer, Quant: 12.23 ug/mL-FEU — ABNORMAL HIGH (ref 0.00–0.50)

## 2019-03-29 LAB — MAGNESIUM: Magnesium: 2.1 mg/dL (ref 1.7–2.4)

## 2019-03-29 MED ORDER — INSULIN ASPART 100 UNIT/ML ~~LOC~~ SOLN
3.0000 [IU] | Freq: Three times a day (TID) | SUBCUTANEOUS | Status: DC
  Administered 2019-03-30 (×2): 3 [IU] via SUBCUTANEOUS

## 2019-03-29 MED ORDER — INSULIN ASPART 100 UNIT/ML ~~LOC~~ SOLN
0.0000 [IU] | Freq: Three times a day (TID) | SUBCUTANEOUS | Status: DC
  Administered 2019-03-29: 8 [IU] via SUBCUTANEOUS
  Administered 2019-03-30 (×2): 2 [IU] via SUBCUTANEOUS
  Administered 2019-03-31 (×2): 3 [IU] via SUBCUTANEOUS
  Administered 2019-03-31: 2 [IU] via SUBCUTANEOUS
  Administered 2019-04-01: 5 [IU] via SUBCUTANEOUS
  Administered 2019-04-01 (×2): 3 [IU] via SUBCUTANEOUS

## 2019-03-29 MED ORDER — FREE WATER
200.0000 mL | Freq: Four times a day (QID) | Status: DC
  Administered 2019-03-29 – 2019-03-30 (×5): 200 mL via ORAL

## 2019-03-29 MED ORDER — INSULIN ASPART 100 UNIT/ML ~~LOC~~ SOLN: 0.0000 [IU] | Freq: Every day | SUBCUTANEOUS | Status: DC

## 2019-03-29 NOTE — [place] Notes (Signed)
Daughter called to update on patient's day. Informed daughter patient was in no apparent distress and worked with therapy today. Daughter was made aware patient received food and ate about 1/4 of food. Daughter asked if pt ate the fish. Daughter was made aware for future, not to bring fish with bones as it put's patient at risk for aspiration and injury. Daughter asked nurse to hold on- several minutes passed and daughter did not come back to phone. Line disconnected by this nurse.

## 2019-03-29 NOTE — Plan of Care (Signed)
  Problem: Education: Goal: Knowledge of General Education information will improve Description Including pain rating scale, medication(s)/side effects and non-pharmacologic comfort measures Outcome: Progressing   

## 2019-03-29 NOTE — Progress Notes (Addendum)
**Note De-Identified vi Obfusction** PROGRESS NOTE    Tyler Jimenez  ZOX:[id number] DOB: 1931/07/08 DO: 03/25/2019 PCP: Lyndon Code, MD   Brief Nrrtive:  Tyler Jimenez is  85 y.o. mle with medicl history significnt of dibetes mellitus type 2 hypertension dementi, nursing home resident, ws dignosed with Covid positive t nursing home.  Ws strted on Decdron.  Ptient hs history of dibetes mellitus type 2 is on insulin.  His blood sugr went up to 780.  With nion gp 16 CO2 21.  Sme time ptient hs cute kidney injury secondry to dehydrtion.  His cidosis is  combintion of mild DK nd cute kidney injury.  Chest x-ry showed right lower lobe infiltrte Ptient ws strted on Endo tool protocol, given Mxipime for his pneumoni, repet Covid test. Emergency room physicin discussed the cse with the fmily no remdesivir.  [facility], but dughter declined this.  Will keep here t [facility] for now.  ssessment & Pln:   ctive Problems:   Dementi with behviorl disturbnce (HCC)   [place]ent[place] hypertension   Mixed hyperlipidemi   Uncontrolled type 2 dibetes mellitus with hyperglycemi (HCC)   DK, type 2 (HCC)   KI (cute kidney injury) (HCC)   Right lower lobe pulmonry infiltrte   COVID-19   [facility]-ssocited pneumoni   COVID-19 virus infection   cute pulmonry embolism with cute cor pulmonle (HCC)  COVID 19 Pneumoni  cute Hypoxic Respirtory Filure  Submssive PE with extensive Right Lower Extremity DVT: Continue steroids (dexmethsone dy 5) - pt comfortble on R Pt dughter does not wnt remdesivir - she's concerned regrding side effects - hd long converstion with her regrding this.  Will hold off on remdesivir t this time. CT chest with lrge volume bilterl PE - R hert strin.  Peripherl ground glss opcities. Dily lbs - notble for improved d dimer, stble ferritin, rising CRP Proclcitonin <0.1, low suspicion for bcteril  pneumoni - will d/c cefepime nd follow Continue heprin gtt per phrmcy (pln for t lest 3 dys of IV heprin with submssive PE nd extensive RLE DVT 11/18 - present).  Pln to strt eliquis on 11/22.  Echo demonstrtes mildly reduced RV systolic function nd concern for mcconnell's sign  LE Kore lso shows n extensive right lower extremity DVT extending to right femorl vein Given hemodynmic stbility nd stble O2 needs, no indiction for lytics t this point (he'd be high risk given his ge)  COVID-19 Lbs  Recent Lbs    03/27/19 0500 03/28/19 0117 03/29/19 0403  DDIMER >20.00* >20.00* 12.23*  FERRITIN 427* 576* 566*  CRP 2.5* 5.6* 7.2*    Lb Results  Component Vlue Dte   SRSCOV2N POSITIVE () 03/25/2019   SRSCOV2N NEGTIVE 02/04/2019   Mild DK  T2DM: improved t this time.  lntus 7 units   cute Kidney Injury  Hyperntremi: Bseline cretinine ppers bout 1.37.  1.85 t pek.  Improving to 1.15 tody.   Hyperntremi improving Encourge PO intke nd free wter  Lctic cidosis: improved, but not resolved.  Pt ppers stble.  Will stop following t this time.    Hypertension:  mlodipine on hold.  BP resonble, follow.   Dementi  Delirium: delirium precutions, follow - he's impulsive - sitter ordered.  tivn prn gittion.  QTc prolonged, precludes ntipsychotics.    Thrombocytopeni: Possibly relted to virl infection.  follow, he is on heprin t this time, no si/sx bleeding. Stble.  Follow.   DVT prophylxis: lovenox - heprin gtt Code Sttus:  full, confirmed with daughter 11/18 Family Communication: daughter 11/21  Disposition Plan: pending further improvement  Consultants:   none  Procedures:   none  Antimicrobials:  Anti-infectives (From admission, onward)   Start     Dose/Rate Route Frequency Ordered Stop   03/26/19 1930  ceFEPIme (MAXIPIME) 2 g in sodium chloride 0.9 % 100 mL IVPB  Status:  Discontinued     2 g 200  mL/hr over 30 Minutes Intravenous Every 24 hours 03/25/19 2005 03/25/19 2009   03/26/19 0830  ceFEPIme (MAXIPIME) 2 g in sodium chloride 0.9 % 100 mL IVPB  Status:  Discontinued     2 g 200 mL/hr over 30 Minutes Intravenous Every 12 hours 03/25/19 2009 03/26/19 1859   03/25/19 1745  vancomycin (VANCOCIN) 1,500 mg in sodium chloride 0.9 % 500 mL IVPB     1,500 mg 250 mL/hr over 120 Minutes Intravenous  Once 03/25/19 1739 03/25/19 2154   03/25/19 1730  vancomycin (VANCOCIN) IVPB 1000 mg/200 mL premix  Status:  Discontinued     1,000 mg 200 mL/hr over 60 Minutes Intravenous  Once 03/25/19 1727 03/25/19 1739   03/25/19 1730  ceFEPIme (MAXIPIME) 2 g in sodium chloride 0.9 % 100 mL IVPB     2 g 200 mL/hr over 30 Minutes Intravenous  Once 03/25/19 1727 03/25/19 1957     Subjective: No complaints at this time Persistently confused  Objective: Vitals:   03/28/19 2300 03/29/19 0359 03/29/19 0805 03/29/19 1140  BP: 102/78 (!) 124/58  116/81  Pulse: 82 96 90   Resp: 20 20  20   Temp: 98 F (36.7 C) 98 F (36.7 C) 98.2 F (36.8 C) 98.8 F (37.1 C)  TempSrc: Oral Axillary Oral Axillary  SpO2: 94% 96%  100%  Weight:      Height:        Intake/Output Summary (Last 24 hours) at 03/29/2019 1354 Last data filed at 03/29/2019 0300 Gross per 24 hour  Intake 152.17 ml  Output 200 ml  Net -47.83 ml   Filed Weights   03/25/19 1700  Weight: 79.4 kg    Examination:  General: No acute distress.  Lying with head under covers. Cardiovascular: RRR Lungs: unlabored Abdomen: Soft, nontender, nondistended Neurological: Alert and disoriented. Moves all extremities 4 . Cranial nerves II through XII grossly intact. Skin: Warm and dry. No rashes or lesions. Extremities: No clubbing or cyanosis. No edema.  Data Reviewed: I have personally reviewed following labs and imaging studies  CBC: Recent Labs  Lab 03/25/19 1909  03/26/19 0418 03/27/19 0500 03/27/19 1338 03/28/19 0117 03/29/19  0403  WBC 12.6*  --  13.2* 12.6* 16.3* 13.5* 9.7  NEUTROABS 11.3*  --  11.7* 11.1*  --  11.6* 7.2  HGB 15.1   < > 13.9 12.0* 14.3 12.8* 13.2  HCT 47.6   < > 45.7 40.3 46.0 40.1 42.3  MCV 87.5  --  88.7 90.8 87.6 86.2 87.6  PLT 156  --  139* 96* 103* 102* 115*   < > = values in this interval not displayed.   Basic Metabolic Panel: Recent Labs  Lab 03/26/19 0215  03/26/19 0418  03/27/19 0500 03/27/19 0819 03/28/19 0117 03/28/19 1514 03/29/19 0403  NA 156*   < > 157*   < > 133* 148* 150* 149* 148*  K 4.4   < > 4.9   < > 4.5 4.6 3.8 3.9 3.5  CL 120*   < > 119*   < > 99 113*  118* 116* 114*  CO2 24   < > 24   < > 23 22 22 24 22   GLUCOSE 76   < > 163*   < > 724* 331* 113* 205* 198*  BUN 43*   < > 41*   < > 24* 25* 21 24* 19  CRETININE 1.36*   < > 1.43*   < > 1.22 1.26* 1.15 1.18 1.14  CLCIUM 9.7   < > 9.7   < > 8.4* 9.2 8.9 8.8* 8.7*  MG 2.6*  --  2.6*  --  1.9  --  2.1  --  2.1  PHOS 3.4  --  4.3  --  3.7  --  3.7  --  3.8   < > = values in this interval not displayed.   GFR: Estimated Creatinine Clearance: 46.9 mL/min (by C-G formula based on SCr of 1.14 mg/dL). Liver Function Tests: Recent Labs  Lab 03/25/19 1620 03/26/19 0418 03/27/19 0500 03/28/19 0117 03/29/19 0403  ST 38 41 30 29 20   LT 46* 39 31 31 27   LKPHOS 56 46 44 41 42  BILITOT 0.9 1.1 1.1 0.8 0.7  PROT 8.1 7.2 5.8* 5.9* 6.3*  LBUMIN 3.5 2.9* 2.4* 2.5* 2.4*   Recent Labs  Lab 03/25/19 1620  LIPSE 68*   No results for input(s): MMONI in the last 168 hours. Coagulation Profile: No results for input(s): INR, PROTIME in the last 168 hours. Cardiac Enzymes: No results for input(s): CKTOTL, CKMB, CKMBINDEX, TROPONINI in the last 168 hours. BNP (last 3 results) No results for input(s): PROBNP in the last 8760 hours. Hb1C: No results for input(s): HGB1C in the last 72 hours. CBG: Recent Labs  Lab 03/28/19 2024 03/29/19 0131 03/29/19 0340 03/29/19 0804 03/29/19 1139  GLUCP 181* 204*  183* 170* 220*   Lipid Profile: No results for input(s): CHOL, HDL, LDLCLC, TRIG, CHOLHDL, LDLDIRECT in the last 72 hours. Thyroid Function Tests: No results for input(s): TSH, T4TOTL, FREET4, T3FREE, THYROIDB in the last 72 hours. nemia Panel: Recent Labs    03/28/19 0117 03/29/19 0403  FERRITIN 576* 566*   Sepsis Labs: Recent Labs  Lab 03/26/19 0050 03/27/19 0327 03/27/19 0819 03/27/19 1338 03/28/19 0117  PROCLCITON <0.10  --   --   --   --   LTICCIDVEN 3.1* 2.6* 3.2* 3.8* 2.5*    Recent Results (from the past 240 hour(s))  SRS CORONVIRUS 2 (TT 6-24 HRS) Nasopharyngeal Nasopharyngeal Swab     Status: bnormal   Collection Time: 03/25/19  5:27 PM   Specimen: Nasopharyngeal Swab  Result Value Ref Range Status   SRS Coronavirus 2 POSITIVE () NEGTIVE Final    Comment: RESULT CLLED TO, RED BCK BY ND VERIFIED WITH: RN  MCKEOWN @0826  03/26/19 BY S GEZHEGN (NOTE) SRS-CoV-2 target nucleic acids are DETECTED. The SRS-CoV-2 RN is generally detectable in upper and lower respiratory specimens during the acute phase of infection. Positive results are indicative of active infection with SRS-CoV-2. Clinical  correlation with patient history and other diagnostic information is necessary to determine patient infection status. Positive results do  not rule out bacterial infection or co-infection with other viruses. The expected result is Negative. Fact Sheet for Patients: HairSlick.nohttps://www.fda.gov/media/138098/download Fact Sheet for Healthcare Providers: quierodirigir.comhttps://www.fda.gov/media/138095/download This test is not yet approved or cleared by the [place] FD and  has been authorized for detection and/or diagnosis of SRS-CoV-2 by FD under an Emergency Use uthorization (EU). This EU will remain  in effect (meaning this **Note De-Identified vi Obfusction** test cn be used)  for the durtion of the COVID-19 declrtion under Section 564(b)(1) of the Act, 21 U.S.C. section 360bbb-3(b)(1), unless  the uthoriztion is terminted or revoked sooner. Performed t [facility], [address], [place], [place] [address]   Blood culture (routine x 2)     Sttus: None (Preliminry result)   Collection Time: 03/25/19  7:09 PM   Specimen: BLOOD  Result Vlue Ref Rnge Sttus   Specimen Description BLOOD RIGHT ANTECUBITAL  Finl   Specil Requests   Finl    BOTTLES DRAWN AEROBIC AND ANAEROBIC Blood Culture results my not be optiml due to n indequte volume of blood received in culture bottles   Culture   Finl    NO GROWTH 4 DAYS Performed t [facility], [address], [place], [place] [address]    Report Sttus PENDING  Incomplete  Blood culture (routine x 2)     Sttus: None (Preliminry result)   Collection Time: 03/25/19  7:14 PM   Specimen: BLOOD  Result Vlue Ref Rnge Sttus   Specimen Description BLOOD LEFT ANTECUBITAL  Finl   Specil Requests   Finl    BOTTLES DRAWN AEROBIC AND ANAEROBIC Blood Culture dequte volume   Culture   Finl    NO GROWTH 4 DAYS Performed t [facility], [address], [place], [place] [address]    Report Sttus PENDING  Incomplete  MRSA PCR Screening     Sttus: None   Collection Time: 03/28/19 12:58 AM   Specimen: Nsophryngel  Result Vlue Ref Rnge Sttus   MRSA by PCR NEGATIVE NEGATIVE Finl    Comment:        The GeneXpert MRSA Assy (FDA pproved for NASAL specimens only), is one component of  comprehensive MRSA coloniztion surveillnce progrm. It is not intended to dignose MRSA infection nor to guide or monitor tretment for MRSA infections. Performed t [facility], [address] [place], [place] [address]          Rdiology Studies: No results found.      Scheduled Meds: . choleclciferol  1,000 Units Orl Dily  . dexmethsone (DECADRON) injection  6 mg Intrvenous Q24H  . folic cid  1 mg Orl Dily  . free wter  200 mL Orl QID  . insulin sprt  0-9 Units  Subcutneous Q4H  . insulin glrgine  7 Units Subcutneous Q24H  . sodium chloride flush  10-40 mL Intrctheter Q12H  . sodium chloride flush  3 mL Intrvenous Q12H  . vitmin C  500 mg Orl Dily  . zinc sulfte  220 mg Orl Dily   Continuous Infusions: . heprin 900 Units/hr (03/28/19 2245)     LOS: 4 dys    Time spent: over 30 min    Lcreti Nicks, MD [facility] mion  If 7PM-7AM, plese contct night-coverge [email or web address] Pssword [email or web address] 03/29/2019, 1:54 PM

## 2019-03-29 NOTE — Evaluation (Signed)
Physical Therapy Evaluation Patient Details Name: Tyler Jimenez MRN: [id number] DOB: 08-17-1931 Today's Date: 03/29/2019   History of Present Illness  85 y.o. male with medical history significant of diabetes mellitus type 2 hypertension dementia, nursing home resident, was diagnosed with Covid positive at nursing home.    Clinical Impression  Pt admitted with above diagnosis. PTA pt resided in memory care unit at [facility]. On eval, he required min guard assist bed mobility, min assist transfers and min/HHA ambulation 20' without AD. Pt mobilized on RA with SpO2 >86%.  Pt currently with functional limitations due to the deficits listed below (see PT Problem List). Pt will benefit from skilled PT to increase their independence and safety with mobility to allow discharge to the venue listed below.       Follow Up Recommendations SNF    Equipment Recommendations  None recommended by PT    Recommendations for Other Services       Precautions / Restrictions Precautions Precautions: Fall;Other (comment) Precaution Comments: watch sats      Mobility  Bed Mobility Overal bed mobility: Needs Assistance Bed Mobility: Supine to Sit     Supine to sit: Min guard;HOB elevated     General bed mobility comments: +rail, increased time, min guard for safety  Transfers Overall transfer level: Needs assistance Equipment used: None Transfers: Sit to/from Stand Sit to Stand: Min assist         General transfer comment: assist to power up and stabilize balance  Ambulation/Gait Ambulation/Gait assistance: Min assist;+2 safety/equipment Gait Distance (Feet): 20 Feet Assistive device: 1 person hand held assist Gait Pattern/deviations: Step-through pattern;Decreased stride length Gait velocity: decreased   General Gait Details: assist to maintain balance  Stairs            Wheelchair Mobility    Modified Rankin (Stroke Patients Only)       Balance Overall  balance assessment: Needs assistance Sitting-balance support: No upper extremity supported;Feet supported Sitting balance-Leahy Scale: Good     Standing balance support: Single extremity supported;During functional activity Standing balance-Leahy Scale: Fair                               Pertinent Vitals/Pain Pain Assessment: No/denies pain    Home Living Family/patient expects to be discharged to:: Skilled nursing facility                 Additional Comments: resides at [facility] memory care unit    Prior Function Level of Independence: Independent         Comments: Pt reporting that he performs BADLs and does not use DME for mobility     Hand Dominance   Dominant Hand: Right    Extremity/Trunk Assessment   Upper Extremity Assessment Upper Extremity Assessment: Generalized weakness    Lower Extremity Assessment Lower Extremity Assessment: Generalized weakness    Cervical / Trunk Assessment Cervical / Trunk Assessment: Kyphotic  Communication   Communication: HOH  Cognition Arousal/Alertness: Awake/alert Behavior During Therapy: WFL for tasks assessed/performed Overall Cognitive Status: Difficult to assess                                 General Comments: Pt with difficulty following commands and requiring increased cues and time. Possibly due to [facility] and loud environment.  Pt sometimes responding to questions with general answer and unsure if he was  able to hear or understand question      General Comments General comments (skin integrity, edema, etc.): SpO2 > 86% on RA. max HR 111    Exercises     Assessment/Plan    PT Assessment Patient needs continued PT services  PT Problem List Decreased strength;Decreased activity tolerance;Decreased balance;Decreased knowledge of precautions;Decreased mobility;Cardiopulmonary status limiting activity       PT Treatment Interventions Balance training;Gait training;Functional  mobility training;Patient/family education;Therapeutic activities;Therapeutic exercise    PT Goals (Current goals can be found in the Care Plan section)  Acute Rehab PT Goals Patient Stated Goal: "Go home" PT Goal Formulation: With patient Time For Goal Achievement: 04/12/19 Potential to Achieve Goals: Good    Frequency Min 2X/week   Barriers to discharge        Co-evaluation PT/OT/SLP Co-Evaluation/Treatment: Yes Reason for Co-Treatment: For patient/therapist safety PT goals addressed during session: Mobility/safety with mobility;Balance OT goals addressed during session: ADL's and self-care       AM-PAC PT "6 Clicks" Mobility  Outcome Measure Help needed turning from your back to your side while in a flat bed without using bedrails?: A Little Help needed moving from lying on your back to sitting on the side of a flat bed without using bedrails?: A Little Help needed moving to and from a bed to a chair (including a wheelchair)?: A Little Help needed standing up from a chair using your arms (e.g., wheelchair or bedside chair)?: A Little Help needed to walk in hospital room?: A Little Help needed climbing 3-5 steps with a railing? : A Lot 6 Click Score: 17    End of Session   Activity Tolerance: Patient tolerated treatment well Patient left: in chair;with chair alarm set;with call bell/phone within reach Nurse Communication: Mobility status PT Visit Diagnosis: Unsteadiness on feet (R26.81);Muscle weakness (generalized) (M62.81)    Time: 2355-7322 PT Time Calculation (min) (ACUTE ONLY): 30 min   Charges:   PT Evaluation $PT Eval Moderate Complexity: 1 Mod          Lorrin Goodell, PT  Office # [phone] Pager [phone]   Lorriane Shire 03/29/2019, 2:58 PM

## 2019-03-29 NOTE — Evaluation (Signed)
Occupational Therapy Evaluation Patient Details Name: Tyler Jimenez MRN: [id number] DOB: Sep 25, 1931 Today's Date: 03/29/2019    History of Present Illness 85 y.o. male with medical history significant of diabetes mellitus type 2 hypertension dementia, nursing home resident, was diagnosed with Covid positive at nursing home.   Clinical Impression   PTA, pt was living at [facility] and reports he performed BADLs and mobility without DME. Pt currently requiring Min A for ADLs and functional mobility demonstrating decreased strength, balance, and activity tolerance. SpO2 >86% on RA and HR elevated to 111 with activity. Pt would benefit from further acute OT to facilitate safe dc. Recommend dc to SNF for further OT to optimize safety, independence with ADLs, and return to PLOF.      Follow Up Recommendations  SNF;Supervision/Assistance - 24 hour    Equipment Recommendations  Other (comment)(Defer to next venue)    Recommendations for Other Services PT consult     Precautions / Restrictions Precautions Precautions: Fall;Other (comment) Precaution Comments: watch sats      Mobility Bed Mobility Overal bed mobility: Needs Assistance Bed Mobility: Supine to Sit     Supine to sit: Min guard     General bed mobility comments: Min Guard A for safety and HOB eleavted. Increased visual cues to follow commands  Transfers Overall transfer level: Needs assistance Equipment used: None Transfers: Sit to/from Stand Sit to Stand: Min assist         General transfer comment: Min A for power up and to steady in standing    Balance Overall balance assessment: Needs assistance                                         ADL either performed or assessed with clinical judgement   ADL Overall ADL's : Needs assistance/impaired Eating/Feeding: Set up;Sitting   Grooming: Oral care;Min guard;Standing Grooming Details (indicate cue type and reason): Min Guard A for safety. SpO2  >86% on RA while standing at sink Upper Body Bathing: Min guard;Sitting   Lower Body Bathing: Minimal assistance;Sit to/from stand   Upper Body Dressing : Min guard;Sitting   Lower Body Dressing: Minimal assistance;Sit to/from stand   Toilet Transfer: Minimal assistance;Ambulation(simulated to recliner) Statistician Details (indicate cue type and reason): Min A for safety and balance         Functional mobility during ADLs: Minimal assistance;+2 for safety/equipment General ADL Comments: Pt presenting with decreased balance, strength, and activity tolerance.      Vision         Perception     Praxis      Pertinent Vitals/Pain Pain Assessment: No/denies pain     Hand Dominance Right   Extremity/Trunk Assessment Upper Extremity Assessment Upper Extremity Assessment: Generalized weakness   Lower Extremity Assessment Lower Extremity Assessment: Defer to PT evaluation   Cervical / Trunk Assessment Cervical / Trunk Assessment: Kyphotic   Communication Communication Communication: HOH   Cognition Arousal/Alertness: Awake/alert Behavior During Therapy: WFL for tasks assessed/performed Overall Cognitive Status: Difficult to assess                                 General Comments: Pt with difficulty following commands and requiring increased cues and time. Possibly due to [facility] and loud environment.  Pt sometimes responding to questions with general answer and unsure if he was  able to hear or understand question   General Comments  SpO2 >86% on RA with O2 monitor on earlobe. HR elevating to 111 with activity    Exercises     Shoulder Instructions      Home Living Family/patient expects to be discharged to:: Skilled nursing facility                                        Prior Functioning/Environment Level of Independence: Independent        Comments: Pt reporting that he performs BADLs and does not use DME for mobility         OT Problem List: Decreased strength;Decreased range of motion;Decreased activity tolerance;Impaired balance (sitting and/or standing);Decreased knowledge of use of DME or AE;Decreased knowledge of precautions;Cardiopulmonary status limiting activity      OT Treatment/Interventions: Self-care/ADL training;Therapeutic exercise;Energy conservation;DME and/or AE instruction;Therapeutic activities;Patient/family education    OT Goals(Current goals can be found in the care plan section) Acute Rehab OT Goals Patient Stated Goal: "Go home" OT Goal Formulation: With patient Time For Goal Achievement: 04/12/19 Potential to Achieve Goals: Good  OT Frequency: Min 2X/week   Barriers to D/C:            Co-evaluation PT/OT/SLP Co-Evaluation/Treatment: Yes Reason for Co-Treatment: For patient/therapist safety PT goals addressed during session: Mobility/safety with mobility;Balance OT goals addressed during session: ADL's and self-care      AM-PAC OT "6 Clicks" Daily Activity     Outcome Measure Help from another person eating meals?: None Help from another person taking care of personal grooming?: A Little Help from another person toileting, which includes using toliet, bedpan, or urinal?: A Little Help from another person bathing (including washing, rinsing, drying)?: A Little Help from another person to put on and taking off regular upper body clothing?: A Little Help from another person to put on and taking off regular lower body clothing?: A Little 6 Click Score: 19   End of Session Nurse Communication: Mobility status  Activity Tolerance: Patient tolerated treatment well;Patient limited by fatigue Patient left: in chair;with call bell/phone within reach;with chair alarm set  OT Visit Diagnosis: Unsteadiness on feet (R26.81);Other abnormalities of gait and mobility (R26.89);Muscle weakness (generalized) (M62.81)                Time: 9518-8416 OT Time Calculation (min): 34  min Charges:  OT General Charges $OT Visit: 1 Visit OT Evaluation $OT Eval Moderate Complexity: [place], OTR/L Acute Rehab Pager: [phone] Office: [place] 03/29/2019, 2:52 PM

## 2019-03-29 NOTE — Care Management (Signed)
Unable to verify cost of medications through insurance as they are closed over the weekend. Once scripts sent to a pharmacy at [place] they cam be called to ask cost. Patient would be eligible for 30 day free card for xaralto or eliquis if they have not used one in the past.

## 2019-03-29 NOTE — Progress Notes (Signed)
ANTICOAGULATION CONSULT NOTE   Pharmacy Consult for Heparin Indication: pulmonary embolus  No Known Allergies  Patient Measurements: Height: 5\' 6"  (167.6 cm) Weight: 175 lb (79.4 kg) IBW/kg (Calculated) : 63.8 Heparin Dosing Weight: 79.4 kg  Vital Signs: Temp: 98.2 F (36.8 C) (11/21 0805) Temp Source: Oral (11/21 0805) BP: 124/58 (11/21 0359) Pulse Rate: 90 (11/21 0805)  Labs: Recent Labs    03/27/19 1338 03/28/19 0117 03/28/19 1514 03/28/19 1540 03/29/19 0403  HGB 14.3 12.8*  --   --  13.2  HCT 46.0 40.1  --   --  42.3  PLT 103* 102*  --   --  115*  APTT 193* 110* 131*  --   --   HEPARINUNFRC 1.14* 0.47  --  0.45  --   CREATININE  --  1.15 1.18  --  1.14    Estimated Creatinine Clearance: 46.9 mL/min (by C-G formula based on SCr of 1.14 mg/dL).   Medical History: Past Medical History:  Diagnosis Date  . Asthma    as a child  . Cataract   . Dementia ([place])   . Diabetes mellitus without complication ([place])   . HOH (hard of hearing)   . Hypertension     Medications:  Scheduled:  . cholecalciferol  1,000 Units Oral Daily  . dexamethasone (DECADRON) injection  6 mg Intravenous Q24H  . folic acid  1 mg Oral Daily  . free water  200 mL Oral QID  . insulin aspart  0-9 Units Subcutaneous Q4H  . insulin glargine  7 Units Subcutaneous Q24H  . sodium chloride flush  10-40 mL Intracatheter Q12H  . sodium chloride flush  3 mL Intravenous Q12H  . vitamin C  500 mg Oral Daily  . zinc sulfate  220 mg Oral Daily    Assessment: Patient is a 25 yom that has been diagnosed with COVID from his nursing home. The patient had and elevated D-Dimer and was found to have a PE with some evidence of right heart strain. Pharmacy has been asked to dose heparin for this patient.   Today, heparin level remains therapeutic at 0.41. CBC stable and no bleeding noted. No issues with the infusion per RN.   Goal of Therapy:  Heparin level 0.3-0.7 units/ml Monitor platelets by  anticoagulation protocol: Yes   Plan:  -Cont heparin at 900 units/hr -Daily heparin level and CBC -Monitor for s/sx bleeding   Brendolyn Patty, PharmD PGY2 Pharmacy Resident Phone [phone]  03/29/2019   11:11 AM

## 2019-03-30 ENCOUNTER — Inpatient Hospital Stay (HOSPITAL_COMMUNITY): Payer: Medicare Other

## 2019-03-30 LAB — COMPREHENSIVE METABOLIC PANEL
ALT: 22 U/L (ref 0–44)
AST: 18 U/L (ref 15–41)
Albumin: 2.3 g/dL — ABNORMAL LOW (ref 3.5–5.0)
Alkaline Phosphatase: 42 U/L (ref 38–126)
Anion gap: 8 (ref 5–15)
BUN: 14 mg/dL (ref 8–23)
CO2: 24 mmol/L (ref 22–32)
Calcium: 8.7 mg/dL — ABNORMAL LOW (ref 8.9–10.3)
Chloride: 116 mmol/L — ABNORMAL HIGH (ref 98–111)
Creatinine, Ser: 1.03 mg/dL (ref 0.61–1.24)
GFR calc Af Amer: >60 mL/min (ref >60)
GFR calc non Af Amer: >60 mL/min (ref >60)
Glucose, Bld: 159 mg/dL — ABNORMAL HIGH (ref 70–99)
Potassium: 4 mmol/L (ref 3.5–5.1)
Sodium: 148 mmol/L — ABNORMAL HIGH (ref 135–145)
Total Bilirubin: 0.6 mg/dL (ref 0.3–1.2)
Total Protein: 5.9 g/dL — ABNORMAL LOW (ref 6.5–8.1)

## 2019-03-30 LAB — CBC WITH DIFFERENTIAL/PLATELET
Abs Immature Granulocytes: 0.12 10*3/uL — ABNORMAL HIGH (ref 0.00–0.07)
Basophils Absolute: 0 10*3/uL (ref 0.0–0.1)
Basophils Relative: 0 %
Eosinophils Absolute: 0.1 10*3/uL (ref 0.0–0.5)
Eosinophils Relative: 1 %
HCT: 39.3 % (ref 39.0–52.0)
Hemoglobin: 12 g/dL — ABNORMAL LOW (ref 13.0–17.0)
Immature Granulocytes: 1 %
Lymphocytes Relative: 17 %
Lymphs Abs: 1.6 10*3/uL (ref 0.7–4.0)
MCH: 26.9 pg (ref 26.0–34.0)
MCHC: 30.5 g/dL (ref 30.0–36.0)
MCV: 88.1 fL (ref 80.0–100.0)
Monocytes Absolute: 0.8 10*3/uL (ref 0.1–1.0)
Monocytes Relative: 9 %
Neutro Abs: 6.4 10*3/uL (ref 1.7–7.7)
Neutrophils Relative %: 72 %
Platelets: 124 10*3/uL — ABNORMAL LOW (ref 150–400)
RBC: 4.46 MIL/uL (ref 4.22–5.81)
RDW: 14 % (ref 11.5–15.5)
WBC: 9 10*3/uL (ref 4.0–10.5)
nRBC: 0 % (ref 0.0–0.2)

## 2019-03-30 LAB — CULTURE, BLOOD (ROUTINE X 2)
Culture: NO GROWTH
Culture: NO GROWTH
Special Requests: ADEQUATE

## 2019-03-30 LAB — HEPARIN LEVEL (UNFRACTIONATED): Heparin Unfractionated: 0.15 IU/mL — ABNORMAL LOW (ref 0.30–0.70)

## 2019-03-30 LAB — TSH: TSH: 0.976 u[IU]/mL (ref 0.350–4.500)

## 2019-03-30 LAB — GLUCOSE, CAPILLARY
Glucose-Capillary: 149 mg/dL — ABNORMAL HIGH (ref 70–99)
Glucose-Capillary: 122 mg/dL — ABNORMAL HIGH (ref 70–99)
Glucose-Capillary: 118 mg/dL — ABNORMAL HIGH (ref 70–99)
Glucose-Capillary: 45 mg/dL — ABNORMAL LOW (ref 70–99)
Glucose-Capillary: 133 mg/dL — ABNORMAL HIGH (ref 70–99)

## 2019-03-30 LAB — VITAMIN B12: Vitamin B-12: 1348 pg/mL — ABNORMAL HIGH (ref 180–914)

## 2019-03-30 LAB — PHOSPHORUS: Phosphorus: 3.1 mg/dL (ref 2.5–4.6)

## 2019-03-30 LAB — C-REACTIVE PROTEIN: CRP: 8.3 mg/dL — ABNORMAL HIGH (ref <1.0)

## 2019-03-30 LAB — D-DIMER, QUANTITATIVE: D-Dimer, Quant: 7.93 ug/mL-FEU — ABNORMAL HIGH (ref 0.00–0.50)

## 2019-03-30 LAB — AMMONIA: Ammonia: 16 umol/L (ref 9–35)

## 2019-03-30 LAB — MAGNESIUM: Magnesium: 2 mg/dL (ref 1.7–2.4)

## 2019-03-30 LAB — FERRITIN: Ferritin: 568 ng/mL — ABNORMAL HIGH (ref 24–336)

## 2019-03-30 LAB — FOLATE: Folate: 39.8 ng/mL (ref >5.9)

## 2019-03-30 MED ORDER — DEXTROSE IN LACTATED RINGERS 5 % IV SOLN
INTRAVENOUS | Status: AC
  Administered 2019-03-30 – 2019-03-31 (×2): via INTRAVENOUS

## 2019-03-30 MED ORDER — APIXABAN 5 MG PO TABS
10.0000 mg | ORAL_TABLET | Freq: Two times a day (BID) | ORAL | Status: DC
  Administered 2019-03-30 – 2019-04-01 (×4): 10 mg via ORAL
  Filled 2019-03-30 (×5): qty 2

## 2019-03-30 MED ORDER — APIXABAN 5 MG PO TABS: 5.0000 mg | ORAL_TABLET | Freq: Two times a day (BID) | ORAL | Status: DC

## 2019-03-30 NOTE — NC FL2 (Signed)
[place] MEDICAID FL2 LEVEL OF CARE SCREENING TOOL     IDENTIFICATION  Patient Name: Tyler Jimenez Birthdate: Jan 17, 1932 Sex: male Admission Date (Current Location): 03/25/2019  [facility] and [place] Number:  Producer, television/film/video and Address:  [facility], [address], [place], [place] [address]      Provider Number: [id number]  Attending Physician Name and Address:  Zigmund Daniel., *  Relative Name and Phone Number:  Rogue Bussing, Daughter, [phone]    Current Level of Care: Hospital Recommended Level of Care: Assisted Living Facility Prior Approval Number:    Date Approved/Denied:   PASRR Number:    Discharge Plan: Domiciliary (Rest home)    Current Diagnoses: Patient Active Problem List   Diagnosis Date Noted  . Acute deep vein thrombosis (DVT) of lower extremity (HCC)   . Acute pulmonary embolism with acute cor pulmonale (HCC)   . COVID-19 virus infection 03/26/2019  . DKA, type 2 (HCC) 03/25/2019  . AKI (acute kidney injury) (HCC) 03/25/2019  . Right lower lobe pulmonary infiltrate 03/25/2019  . COVID-19 03/25/2019  . Healthcare-associated pneumonia 03/25/2019  . Urinary tract infection with hematuria 01/26/2019  . Loss of appetite 01/26/2019  . Current moderate episode of major depressive disorder without prior episode (HCC) 01/26/2019  . Fall 09/21/2018  . Encounter for health maintenance examination with abnormal findings 04/29/2018  . Dysuria 04/29/2018  . Uncontrolled type 2 diabetes mellitus with hyperglycemia (HCC) 10/17/2017  . Tinea pedis of both feet 10/17/2017  . Dementia with behavioral disturbance (HCC) 04/25/2017  . DM2 (diabetes mellitus, type 2) (HCC) 04/25/2017  . Essential hypertension 04/25/2017  . Cardiac murmur 04/25/2017  . Mixed hyperlipidemia 04/25/2017    Orientation RESPIRATION BLADDER Height & Weight     Self  O2(98, [place], 4L) Incontinent, External catheter Weight: 175 lb (79.4  kg) Height:  5\' 6"  (167.6 cm)  BEHAVIORAL SYMPTOMS/MOOD NEUROLOGICAL BOWEL NUTRITION STATUS      Continent Diet(NPO)  AMBULATORY STATUS COMMUNICATION OF NEEDS Skin   Limited Assist Verbally Normal                       Personal Care Assistance Level of Assistance  Bathing, Feeding, Dressing, Total care Bathing Assistance: Limited assistance Feeding assistance: Independent Dressing Assistance: Limited assistance Total Care Assistance: Limited assistance   Functional Limitations Info  Sight, Hearing, Speech Sight Info: Adequate Hearing Info: Adequate Speech Info: Adequate    SPECIAL CARE FACTORS FREQUENCY  PT (By licensed PT), OT (By licensed OT)     PT Frequency: 5x/wk OT Frequency: 5x/wk            Contractures Contractures Info: Not present    Additional Factors Info  Code Status, Allergies, Isolation Precautions, Insulin Sliding Scale Code Status Info: Full Code Allergies Info: No Known Allergies   Insulin Sliding Scale Info: insulin aspart noolog 0-15 units 3x daily w/meals; insulin aspart novolog 0-5 units daily at bedtime; insulin glargine lantus 7 units every 24 hours Isolation Precautions Info: COVID +     Current Medications (03/30/2019):  This is the current hospital active medication list Current Facility-Administered Medications  Medication Dose Route Frequency Provider Last Rate Last Dose  . apixaban (ELIQUIS) tablet 10 mg  10 mg Oral BID 04/01/2019., MD   10 mg at 03/30/19 0945   Followed by  . [START ON 04/06/2019] apixaban (ELIQUIS) tablet 5 mg  5 mg Oral BID 04/08/2019., MD      .  cholecalciferol (VITAMIN D3) tablet 1,000 Units  1,000 Units Oral Daily Elodia Florence., MD   1,000 Units at 03/30/19 [phone]  . dexamethasone (DECADRON) injection 6 mg  6 mg Intravenous Q24H Cristescu, Linard Millers, MD   6 mg at 03/29/19 2326  . dextrose 50 % solution 0-50 mL  0-50 mL Intravenous PRN Drenda Freeze, MD      . dextrose 50 %  solution 0-50 mL  0-50 mL Intravenous PRN Cristescu, Mircea G, MD      . folic acid (FOLVITE) tablet 1 mg  1 mg Oral Daily Cristescu, Linard Millers, MD   1 mg at 03/30/19 0904  . free water 200 mL  200 mL Oral QID Elodia Florence., MD   200 mL at 03/30/19 0910  . insulin aspart (novoLOG) injection 0-15 Units  0-15 Units Subcutaneous TID WC Elodia Florence., MD   2 Units at 03/30/19 1235  . insulin aspart (novoLOG) injection 0-5 Units  0-5 Units Subcutaneous QHS Elodia Florence., MD      . insulin glargine (LANTUS) injection 7 Units  7 Units Subcutaneous Q24H Elodia Florence., MD   7 Units at 03/30/19 [phone]  . LORazepam (ATIVAN) injection 0.5 mg  0.5 mg Intravenous Q4H PRN Elodia Florence., MD   0.5 mg at 03/29/19 2107  . sodium chloride flush (NS) 0.9 % injection 10-40 mL  10-40 mL Intracatheter Q12H Elodia Florence., MD   10 mL at 03/28/19 1411  . sodium chloride flush (NS) 0.9 % injection 10-40 mL  10-40 mL Intracatheter PRN Elodia Florence., MD      . sodium chloride flush (NS) 0.9 % injection 3 mL  3 mL Intravenous Q12H Cristescu, Linard Millers, MD   3 mL at 03/30/19 0911  . vitamin C (ASCORBIC ACID) tablet 500 mg  500 mg Oral Daily Cristescu, Linard Millers, MD   500 mg at 03/30/19 0904  . zinc sulfate capsule 220 mg  220 mg Oral Daily Cristescu, Linard Millers, MD   220 mg at 03/30/19 5329     Discharge Medications: Please see discharge summary for a list of discharge medications.  Relevant Imaging Results:  Relevant Lab Results:   Additional Information SSN: [id number]  Philippa Chester Karima Carrell, LCSWA

## 2019-03-30 NOTE — Plan of Care (Signed)

## 2019-03-30 NOTE — TOC Initial Note (Signed)
Transition of Care [facility]) - Initial/Assessment Note    Patient Details  Name: Tyler Jimenez MRN: [id number] Date of Birth: Dec 27, 1931  [facility]) CM/SW Contact:    Gwenlyn Fudge, LCSWA Phone Number: 03/30/2019, 3:00 PM  Clinical Narrative:                  CSW called and spoke with the patient's daughter, Cleda Mccreedy Patient is a resident of [facility].   Patient's daughter would like her father to return back to [facility] if acceptable. She would be agreeable to SNF, if he was not able to receive therapy at his ALF. She provided a phone number to [place] at [facility] [phone]). Patient's daughter stated that she has been speaking with her about her father's progress while he has been in the hospital.   CSW provided COVID SNF's to the patient's daughter. She would like [facility] if he can't return back.   CSW called Britta Mccreedy and left a voicemail. CSW is awaiting a return phone call.   CSW will continue to follow and assist with discharge planning.   Expected Discharge Plan: Memory Care Barriers to Discharge: Continued Medical Work up   Patient Goals and CMS Choice Patient states their goals for this hospitalization and ongoing recovery are:: Pt daughter wants her father to return back to his memory care at [email or web address] Compare Post Acute Care list provided to:: Patient Represenative (must comment) Choice offered to / list presented to : Adult Children  Expected Discharge Plan and Services Expected Discharge Plan: Memory Care In-house Referral: Clinical Social Work     Living arrangements for the past 2 months: Assisted Living Facility                                      Prior Living Arrangements/Services Living arrangements for the past 2 months: Assisted Living Facility Lives with:: Facility Resident Patient language and need for interpreter reviewed:: No Do you feel safe going back to the place  where you live?: Yes      Need for Family Participation in Patient Care: Yes (Comment) Care giver support system in place?: Yes (comment)   Criminal Activity/Legal Involvement Pertinent to Current Situation/Hospitalization: No - Comment as needed  Activities of Daily Living Home Assistive Devices/Equipment: Rich Reining) ADL Screening (condition at time of admission) Patient's cognitive ability adequate to safely complete daily activities?: No Is the patient deaf or have difficulty hearing?: No(possible. ) Does the patient have difficulty seeing, even when wearing glasses/contacts?: No(uta) Does the patient have difficulty concentrating, remembering, or making decisions?: Yes Patient able to express need for assistance with ADLs?: No Does the patient have difficulty dressing or bathing?: Yes Independently performs ADLs?: No Communication: Independent Dressing (OT): Dependent Is this a change from baseline?: Pre-admission baseline Grooming: Needs assistance Is this a change from baseline?: Pre-admission baseline Feeding: Dependent Is this a change from baseline?: Pre-admission baseline Bathing: Needs assistance Is this a change from baseline?: Pre-admission baseline Toileting: Needs assistance Is this a change from baseline?: Pre-admission baseline In/Out Bed: Needs assistance Is this a change from baseline?: Pre-admission baseline Walks in Home: Needs assistance(uta) Is this a change from baseline?: Pre-admission baseline Does the patient have difficulty walking or climbing stairs?: Yes Weakness of Legs: Both Weakness of Arms/Hands: Both  Permission Sought/Granted Permission sought to share information with : Case Manager Permission granted to share information  with : Yes, Verbal Permission Granted     Permission granted to share info w AGENCY: [facility]        Emotional Assessment Appearance:: Appears stated age Attitude/Demeanor/Rapport: Unable to Assess Affect (typically  observed): Unable to Assess Orientation: : Oriented to Self Alcohol / Substance Use: Not Applicable Psych Involvement: No (comment)  Admission diagnosis:  Cough [R05] DVT (deep venous thrombosis) (HCC) [I82.409] Diabetic ketoacidosis without coma associated with type 2 diabetes mellitus ([place]) [E11.10] COVID-19 [U07.1] COVID-19 virus infection [U07.1] Patient Active Problem List   Diagnosis Date Noted  . Acute deep vein thrombosis (DVT) of lower extremity ([place])   . Acute pulmonary embolism with acute cor pulmonale (HCC)   . COVID-19 virus infection 03/26/2019  . DKA, type 2 ([place]) 03/25/2019  . AKI (acute kidney injury) ([place]) 03/25/2019  . Right lower lobe pulmonary infiltrate 03/25/2019  . COVID-19 03/25/2019  . Healthcare-associated pneumonia 03/25/2019  . Urinary tract infection with hematuria 01/26/2019  . Loss of appetite 01/26/2019  . Current moderate episode of major depressive disorder without prior episode ([place]) 01/26/2019  . Fall 09/21/2018  . Encounter for health maintenance examination with abnormal findings 04/29/2018  . Dysuria 04/29/2018  . Uncontrolled type 2 diabetes mellitus with hyperglycemia ([place]) 10/17/2017  . Tinea pedis of both feet 10/17/2017  . Dementia with behavioral disturbance ([place]) 04/25/2017  . DM2 (diabetes mellitus, type 2) ([place]) 04/25/2017  . Essential hypertension 04/25/2017  . Cardiac murmur 04/25/2017  . Mixed hyperlipidemia 04/25/2017   PCP:  Lavera Guise, MD Pharmacy:  No Pharmacies Listed    Social Determinants of Health (SDOH) Interventions    Readmission Risk Interventions No flowsheet data found.

## 2019-03-30 NOTE — Progress Notes (Signed)
Speech therapy contacted per MD request/Swallow eval.pt made aware.

## 2019-03-30 NOTE — Evaluation (Signed)
Clinical/Bedside Swallow Evaluation Patient Details  Name: Tyler Jimenez MRN: [id number] Date of Birth: 02/14/1932  Today's Date: 03/30/2019 Time: SLP Start Time (ACUTE ONLY): 1623 SLP Stop Time (ACUTE ONLY): 1636 SLP Time Calculation (min) (ACUTE ONLY): 13 min  Past Medical History:  Past Medical History:  Diagnosis Date  . Asthma    as a child  . Cataract   . Dementia ([place])   . Diabetes mellitus without complication ([place])   . HOH (hard of hearing)   . Hypertension    Past Surgical History:  Past Surgical History:  Procedure Laterality Date  . CATARACT EXTRACTION W/PHACO Right 01/05/2015   Procedure: CATARACT EXTRACTION PHACO AND INTRAOCULAR LENS PLACEMENT (IOC);  Surgeon: Birder Robson, MD;  Location: [facility] ORS;  Service: Ophthalmology;  Laterality: Right;  [place]: 02:33.9 AP%: 29.9 CDE: 46.04 Lot# [id number]  . CATARACT EXTRACTION W/PHACO Left 01/19/2015   Procedure: CATARACT EXTRACTION PHACO AND INTRAOCULAR LENS PLACEMENT (IOC);  Surgeon: Birder Robson, MD;  Location: [facility] ORS;  Service: Ophthalmology;  Laterality: Left;  [place]: 03:59.3 AP%: 28.9 CDE: 69.13 Lot # [id number]  . KNEE SURGERY Right    HPI:  85 y.o. male with medical history significant of diabetes mellitus type 2 hypertension dementia, nursing home resident, was diagnosed with Covid positive at nursing home.  No prior documented hx of dysphagia.    Assessment / Plan / Recommendation Clinical Impression  Pt was seen for a bedside swallow evaluation and he presents with oral dysphagia and suspected functional pharyngeal phase of the swallow.  Pt was lethargic with eyes closed throughout the majority of this evaluation.   RN reported that pt was on a regular solids and thin liquid diet until he was observed to expel solid food from his oral cavity during a meal earlier today.  Pt was then changed to NPO.  He was observed with trials of thin liquid, puree, and regular solids.  He exhibited good bolus acceptance given  verbal cues, and consistent hyolaryngeal elevation/excursion to observation and palpation with all trials.  Pt presented with prolonged mastication and AP transport of regular solid trial with observed bolus holding and pocketing in his L buccal cavity.  Pt was able to effectively clear bolus given moderate verbal cues.  AP transport was timely with liquid and puree trials and no clinical s/sx of aspiration were observed with any trials. Recommend Dysphagia 1 (puree) solids and thin liquid with the following precautions: 1) Small bites/sips 2) Slow rate of intake 3) Only feed when awake/alert 4) Sit upright 90 degrees.    SLP Visit Diagnosis: Dysphagia, oral phase (R13.11)    Aspiration Risk  Mild aspiration risk    Diet Recommendation Thin liquid;Dysphagia 1 (Puree)   Liquid Administration via: Cup;Straw Medication Administration: Crushed with puree Supervision: Full supervision/cueing for compensatory strategies Compensations: Minimize environmental distractions;Slow rate;Small sips/bites Postural Changes: Seated upright at 90 degrees    Other  Recommendations Oral Care Recommendations: Oral care BID;Staff/trained caregiver to provide oral care   Follow up Recommendations Skilled Nursing facility;24 hour supervision/assistance      Frequency and Duration min 2x/week  2 weeks       Prognosis Prognosis for Safe Diet Advancement: Good      Swallow Study   General Date of Onset: 03/30/19 HPI: 85 y.o. male with medical history significant of diabetes mellitus type 2 hypertension dementia, nursing home resident, was diagnosed with Covid positive at nursing home.  No prior documented dysphagia hx.  Type of Study: Bedside Swallow Evaluation Previous Swallow  Assessment: None  Diet Prior to this Study: NPO Temperature Spikes Noted: Yes Respiratory Status: Nasal cannula History of Recent Intubation: No Behavior/Cognition: Lethargic/Drowsy;Requires cueing Oral Cavity Assessment: (Unable  to evaluate) Oral Care Completed by SLP: No Oral Cavity - Dentition: Missing dentition(no dentition on top and missing dentition on bottom ) Self-Feeding Abilities: Needs assist Patient Positioning: Upright in chair Baseline Vocal Quality: (Unable to evaluate)    Oral/Motor/Sensory Function Overall Oral Motor/Sensory Function: (Unable to evaluate)   Ice Chips Ice chips: Not tested   Thin Liquid Thin Liquid: Within functional limits Presentation: Spoon;Straw    Nectar Thick Nectar Thick Liquid: Not tested   Honey Thick Honey Thick Liquid: Not tested   Puree Puree: Within functional limits Presentation: Spoon   Solid     Solid: Impaired Presentation: Spoon Oral Phase Impairments: Reduced labial seal;Reduced lingual movement/coordination;Impaired mastication Oral Phase Functional Implications: Prolonged oral transit;Oral residue;Oral holding;Impaired mastication;Left lateral sulci pocketing     Villa Herb M.S., CCC-SLP Acute Rehabilitation Services Office: [phone]  Shanon Rosser Meghna Hagmann 03/30/2019,4:50 PM

## 2019-03-30 NOTE — Progress Notes (Addendum)
PROGRESS NOTE    Tyler Jimenez  UJW:[id number] DOB: 1931-12-18 DOA: 03/25/2019 PCP: Lyndon CodeKhan, Fozia M, MD   Brief Narrative:  Tyler Jimenez is Tyler Jimenez 85 y.o. male with medical history significant of diabetes mellitus type 2 hypertension dementia, nursing home resident, was diagnosed with Covid positive at nursing home.  Was started on Decadron.  Patient has history of diabetes mellitus type 2 is on insulin.  His blood sugar went up to 780.  With anion gap 16 CO2 21.  Same time patient has acute kidney injury secondary to dehydration.  His acidosis is Elleanor Guyett combination of mild DKA and acute kidney injury.  Chest x-ray showed right lower lobe infiltrate Patient was started on Endo tool protocol, given Maxipime for his pneumonia, repeat Covid test. Emergency room physician discussed the case with the family no remdesivir.  [facility], but daughter declined this.  Will keep [facility] at [facility] for now.  Assessment & Plan:   Active Problems:   Dementia with behavioral disturbance (HCC)   Essential hypertension   Mixed hyperlipidemia   Uncontrolled type 2 diabetes mellitus with hyperglycemia (HCC)   DKA, type 2 (HCC)   AKI (acute kidney injury) (HCC)   Right lower lobe pulmonary infiltrate   COVID-19   Healthcare-associated pneumonia   COVID-19 virus infection   Acute pulmonary embolism with acute cor pulmonale (HCC)   Acute deep vein thrombosis (DVT) of lower extremity (HCC)  COVID 19 Pneumonia  Acute Hypoxic Respiratory Failure  Submassive PE with extensive Right Lower Extremity DVT: Continue steroids (dexamethasone day 5) - pt comfortable on RA Pt daughter does not want remdesivir - she's concerned regarding side effects - had long conversation with her regarding this.  Will hold off on remdesivir at this time. CT chest with large volume bilateral PE - R heart strain.  Peripheral ground glass opacities. Daily labs - notable for improved d dimer, stable ferritin,  rising CRP Procalcitonin <0.1, low suspicion for bacterial pneumonia - will d/c cefepime and follow Continue heparin gtt per pharmacy (plan for at least 3 days of IV heparin with submassive PE and extensive RLE DVT 11/18 - present).  Plan to start eliquis on 11/22.  Echo demonstrates mildly reduced RV systolic function and concern for mcconnell's sign  LE US also shows an extensive right lower extremity DVT extending to right femoral vein Given hemodynamic stability and stable O2 needs, no indication for lytics at this point (he'd be high risk given his age)  COVID-19 Labs  Recent Labs    03/28/19 0117 03/29/19 0403 03/30/19 0405  DDIMER >20.00* 12.23* 7.93*  FERRITIN 576* 566* 568*  CRP 5.6* 7.2* 8.3*    Lab Results  Component Value Date   SARSCOV2NAA POSITIVE (Mcgwire Dasaro) 03/25/2019   SARSCOV2NAA NEGATIVE 02/04/2019   Dementia  Delirium: delirium precautions, follow - he's impulsive - sitter ordered.  Ativan prn agitation.  QTc prolonged, precludes antipsychotics.   Today he was less interactive with me.  Didn't say much while I was in room.  I ordered blood gas, TSH, ammonia, B12, and folate.  Discussed with nursing after I left room, they subsequently evaluated pt and he reportedly was alert and engaged with nurse.  When asked why he didn't speak with me by nurse, he said "that was the doctor".  Seems improved, continue to monitor.  Concern for Aspiration: see nursing note.  NPO, SLP eval.  CXR as well.  Mild DKA  T2DM: improved at this time.  lantus  7 units - BG's reasonable, follow.  SSI.  Acute Kidney Injury  Hypernatremia: Baseline creatinine appears about 1.37.  1.85 at peak.  Improving to 1.03 today.   Hypernatremia stable Encourage PO intake and free water  Lactic Acidosis: improved, but not resolved.  Pt appears stable.  Will stop following at this time.    Hypertension:  Amlodipine on hold.  BP reasonable, follow.   Thrombocytopenia: Possibly related to viral infection.   follow, he is on heparin at this time, no si/sx bleeding. Stable.  Follow.   DVT prophylaxis: lovenox - heparin gtt Code Status: full, confirmed with daughter 11/18 Family Communication: daughter 11/22 Disposition Plan: pending further improvement  Consultants:   none  Procedures:   none  Antimicrobials:  Anti-infectives (From admission, onward)   Start     Dose/Rate Route Frequency Ordered Stop   03/26/19 1930  ceFEPIme (MAXIPIME) 2 g in sodium chloride 0.9 % 100 mL IVPB  Status:  Discontinued     2 g 200 mL/hr over 30 Minutes Intravenous Every 24 hours 03/25/19 2005 03/25/19 2009   03/26/19 0830  ceFEPIme (MAXIPIME) 2 g in sodium chloride 0.9 % 100 mL IVPB  Status:  Discontinued     2 g 200 mL/hr over 30 Minutes Intravenous Every 12 hours 03/25/19 2009 03/26/19 1859   03/25/19 1745  vancomycin (VANCOCIN) 1,500 mg in sodium chloride 0.9 % 500 mL IVPB     1,500 mg 250 mL/hr over 120 Minutes Intravenous  Once 03/25/19 1739 03/25/19 2154   03/25/19 1730  vancomycin (VANCOCIN) IVPB 1000 mg/200 mL premix  Status:  Discontinued     1,000 mg 200 mL/hr over 60 Minutes Intravenous  Once 03/25/19 1727 03/25/19 1739   03/25/19 1730  ceFEPIme (MAXIPIME) 2 g in sodium chloride 0.9 % 100 mL IVPB     2 g 200 mL/hr over 30 Minutes Intravenous  Once 03/25/19 1727 03/25/19 1957     Subjective: Didn't speak much with me today.  Didn't really engage.  Objective: Vitals:   03/30/19 0324 03/30/19 0751 03/30/19 1124 03/30/19 1330  BP: 92/63 111/82 121/67   Pulse: 92     Resp: 15   19  Temp: 97.7 F (36.5 C) 98.1 F (36.7 C) 97.8 F (36.6 C)   TempSrc: Oral Axillary Axillary   SpO2: 97% 97% 98% (!) 79%  Weight:      Height:        Intake/Output Summary (Last 24 hours) at 03/30/2019 1359 Last data filed at 03/30/2019 0949 Gross per 24 hour  Intake 129.48 ml  Output 700 ml  Net -570.52 ml   Filed Weights   03/25/19 1700  Weight: 79.4 kg    Examination:  General: No acute  distress. Cardiovascular: RRR Lungs: unlabored Abdomen: Soft, nontender, nondistended  Neurological: Alert and disoriented. Moves all extremities 4. Cranial nerves II through XII grossly intact. Skin: Warm and dry. No rashes or lesions. Extremities: No clubbing or cyanosis. No edema.  Data Reviewed: I have personally reviewed following labs and imaging studies  CBC: Recent Labs  Lab 03/26/19 0418 03/27/19 0500 03/27/19 1338 03/28/19 0117 03/29/19 0403 03/30/19 0405  WBC 13.2* 12.6* 16.3* 13.5* 9.7 9.0  NEUTROABS 11.7* 11.1*  --  11.6* 7.2 6.4  HGB 13.9 12.0* 14.3 12.8* 13.2 12.0*  HCT 45.7 40.3 46.0 40.1 42.3 39.3  MCV 88.7 90.8 87.6 86.2 87.6 88.1  PLT 139* 96* 103* 102* 115* 191*   Basic Metabolic Panel: Recent Labs  Lab 03/26/19 0418  03/27/19 0500 03/27/19 0819 03/28/19 0117 03/28/19 1514 03/29/19 0403 03/30/19 0405  NA 157*   < > 133* 148* 150* 149* 148* 148*  K 4.9   < > 4.5 4.6 3.8 3.9 3.5 4.0  CL 119*   < > 99 113* 118* 116* 114* 116*  CO2 24   < > 23 22 22 24 22 24   GLUCOSE 163*   < > 724* 331* 113* 205* 198* 159*  BUN 41*   < > 24* 25* 21 24* 19 14  CREATININE 1.43*   < > 1.22 1.26* 1.15 1.18 1.14 1.03  CALCIUM 9.7   < > 8.4* 9.2 8.9 8.8* 8.7* 8.7*  MG 2.6*  --  1.9  --  2.1  --  2.1 2.0  PHOS 4.3  --  3.7  --  3.7  --  3.8 3.1   < > = values in this interval not displayed.   GFR: Estimated Creatinine Clearance: 51.9 mL/min (by C-G formula based on SCr of 1.03 mg/dL). Liver Function Tests: Recent Labs  Lab 03/26/19 0418 03/27/19 0500 03/28/19 0117 03/29/19 0403 03/30/19 0405  AST 41 30 29 20 18   ALT 39 31 31 27 22   ALKPHOS 46 44 41 42 42  BILITOT 1.1 1.1 0.8 0.7 0.6  PROT 7.2 5.8* 5.9* 6.3* 5.9*  ALBUMIN 2.9* 2.4* 2.5* 2.4* 2.3*   Recent Labs  Lab 03/25/19 1620  LIPASE 68*   Recent Labs  Lab 03/30/19 1226  AMMONIA 16   Coagulation Profile: No results for input(s): INR, PROTIME in the last 168 hours. Cardiac Enzymes: No results  for input(s): CKTOTAL, CKMB, CKMBINDEX, TROPONINI in the last 168 hours. BNP (last 3 results) No results for input(s): PROBNP in the last 8760 hours. HbA1C: No results for input(s): HGBA1C in the last 72 hours. CBG: Recent Labs  Lab 03/29/19 1139 03/29/19 1610 03/29/19 2058 03/30/19 0740 03/30/19 1123  GLUCAP 220* 257* 97 149* 122*   Lipid Profile: No results for input(s): CHOL, HDL, LDLCALC, TRIG, CHOLHDL, LDLDIRECT in the last 72 hours. Thyroid Function Tests: No results for input(s): TSH, T4TOTAL, FREET4, T3FREE, THYROIDAB in the last 72 hours. Anemia Panel: Recent Labs    03/29/19 0403 03/30/19 0405  FERRITIN 566* 568*   Sepsis Labs: Recent Labs  Lab 03/26/19 0050 03/27/19 0327 03/27/19 0819 03/27/19 1338 03/28/19 0117  PROCALCITON <0.10  --   --   --   --   LATICACIDVEN 3.1* 2.6* 3.2* 3.8* 2.5*    Recent Results (from the past 240 hour(s))  SARS CORONAVIRUS 2 (TAT 6-24 HRS) Nasopharyngeal Nasopharyngeal Swab     Status: Abnormal   Collection Time: 03/25/19  5:27 PM   Specimen: Nasopharyngeal Swab  Result Value Ref Range Status   SARS Coronavirus 2 POSITIVE (Authur Cubit) NEGATIVE Final    Comment: RESULT CALLED TO, READ BACK BY AND VERIFIED WITH: RN Vanassa Penniman MCKEOWN @0826  03/26/19 BY S GEZAHEGN (NOTE) SARS-CoV-2 target nucleic acids are DETECTED. The SARS-CoV-2 RNA is generally detectable in upper and lower respiratory specimens during the acute phase of infection. Positive results are indicative of active infection with SARS-CoV-2. Clinical  correlation with patient history and other diagnostic information is necessary to determine patient infection status. Positive results do  not rule out bacterial infection or co-infection with other viruses. The expected result is Negative. Fact Sheet for Patients: 03/30/19 Fact Sheet for Healthcare Providers: 03/27/19 This test is not yet approved or cleared by the  FDA and  has been  authorized for detection and/or diagnosis of SARS-CoV-2 by FDA under an Emergency Use Authorization (EUA). This EUA will remain  in effect (meaning this test can be used)  for the duration of the COVID-19 declaration under Section 564(b)(1) of the Act, 21 U.S.C. section 360bbb-3(b)(1), unless the authorization is terminated or revoked sooner. Performed at [facility], [address], [place], [place] [address]   Blood culture (routine x 2)     Status: None   Collection Time: 03/25/19  7:09 PM   Specimen: BLOOD  Result Value Ref Range Status   Specimen Description BLOOD RIGHT ANTECUBITAL  Final   Special Requests   Final    BOTTLES DRAWN AEROBIC AND ANAEROBIC Blood Culture results may not be optimal due to an inadequate volume of blood received in culture bottles   Culture   Final    NO GROWTH 5 DAYS Performed at [facility], [address] [place], [place] [address]    Report Status 03/30/2019 FINAL  Final  Blood culture (routine x 2)     Status: None   Collection Time: 03/25/19  7:14 PM   Specimen: BLOOD  Result Value Ref Range Status   Specimen Description BLOOD LEFT ANTECUBITAL  Final   Special Requests   Final    BOTTLES DRAWN AEROBIC AND ANAEROBIC Blood Culture adequate volume   Culture   Final    NO GROWTH 5 DAYS Performed at [facility], [address], [place], [place] [address]    Report Status 03/30/2019 FINAL  Final  MRSA PCR Screening     Status: None   Collection Time: 03/28/19 12:58 AM   Specimen: Nasopharyngeal  Result Value Ref Range Status   MRSA by PCR NEGATIVE NEGATIVE Final    Comment:        The GeneXpert MRSA Assay (FDA approved for NASAL specimens only), is one component of Jacklyne Baik comprehensive MRSA colonization surveillance program. It is not intended to diagnose MRSA infection nor to guide or monitor treatment for MRSA infections. Performed at [facility], [address],  [place], [place] [address]          Radiology Studies: No results found.      Scheduled Meds: . apixaban  10 mg Oral BID   Followed by  . [START ON 04/06/2019] apixaban  5 mg Oral BID  . cholecalciferol  1,000 Units Oral Daily  . dexamethasone (DECADRON) injection  6 mg Intravenous Q24H  . folic acid  1 mg Oral Daily  . free water  200 mL Oral QID  . insulin aspart  0-15 Units Subcutaneous TID WC  . insulin aspart  0-5 Units Subcutaneous QHS  . insulin aspart  3 Units Subcutaneous TID WC  . insulin glargine  7 Units Subcutaneous Q24H  . sodium chloride flush  10-40 mL Intracatheter Q12H  . sodium chloride flush  3 mL Intravenous Q12H  . vitamin C  500 mg Oral Daily  . zinc sulfate  220 mg Oral Daily   Continuous Infusions:    LOS: 5 days    Time spent: over 30 min    Lacretia Nicks, MD [facility] Pager [email or web address]  If 7PM-7AM, please contact night-coverage [email or web address] Password [facility] 03/30/2019, 1:59 PM

## 2019-03-30 NOTE — Progress Notes (Signed)
[place] for apixaban Indication: pulmonary embolus  No Known Allergies  Patient Measurements: Height: 5\' 6"  (167.6 cm) Weight: 175 lb (79.4 kg) IBW/kg (Calculated) : 63.8 Heparin Dosing Weight: 79.4 kg  Vital Signs: Temp: 97.7 F (36.5 C) (11/22 0324) Temp Source: Oral (11/22 0324) BP: 92/63 (11/22 0324) Pulse Rate: 92 (11/22 0324)  Labs: Recent Labs    03/27/19 1338 03/28/19 0117 03/28/19 1514 03/28/19 1540 03/29/19 0403 03/29/19 1017 03/30/19 0405  HGB 14.3 12.8*  --   --  13.2  --  12.0*  HCT 46.0 40.1  --   --  42.3  --  39.3  PLT 103* 102*  --   --  115*  --  124*  APTT 193* 110* 131*  --   --   --   --   HEPARINUNFRC 1.14* 0.47  --  0.45  --  0.41 0.15*  CREATININE  --  1.15 1.18  --  1.14  --  1.03    Estimated Creatinine Clearance: 51.9 mL/min (by C-G formula based on SCr of 1.03 mg/dL).   Medical History: Past Medical History:  Diagnosis Date  . Asthma    as a child  . Cataract   . Dementia ([place])   . Diabetes mellitus without complication ([place])   . HOH (hard of hearing)   . Hypertension     Medications:  Scheduled:  . cholecalciferol  1,000 Units Oral Daily  . dexamethasone (DECADRON) injection  6 mg Intravenous Q24H  . folic acid  1 mg Oral Daily  . free water  200 mL Oral QID  . insulin aspart  0-15 Units Subcutaneous TID WC  . insulin aspart  0-5 Units Subcutaneous QHS  . insulin aspart  3 Units Subcutaneous TID WC  . insulin glargine  7 Units Subcutaneous Q24H  . sodium chloride flush  10-40 mL Intracatheter Q12H  . sodium chloride flush  3 mL Intravenous Q12H  . vitamin C  500 mg Oral Daily  . zinc sulfate  220 mg Oral Daily    Assessment: Patient is a 74 yom that has been diagnosed with COVID from his nursing home. No anticoagulation prior to admission. The patient had and elevated D-Dimer and was found to have a PE with some evidence of right heart strain. Pharmacy was initially asked to dose  heparin for this patient, now transitioning to apixaban.   CBC stable this morning and no bleeding noted.    Goal of Therapy:  Monitor platelets by anticoagulation protocol: Yes   Plan:  - d/c heparin - start apixaban 10mg  PO twice daily x7 days then on 04/06/2019 start apixaban 5mg  PO twice daily thereafter - monitor for s/sx bleeding - f/u patient education for new apixaban start   Brendolyn Patty, PharmD PGY2 Pharmacy Resident Phone [phone]  03/30/2019   7:50 AM

## 2019-03-30 NOTE — Progress Notes (Signed)
Telesitter called this nurse. States noticed that after pt was assisted with lunch, pt began to cough. Noted O2 sats decreasing into the 70's on RA. This nurse went to assess pt and noticed that pt had food in hand that appeared to be previously in mouth (meat and some rice) the patient was placed on O2 for comfort. Pt was asked to cough and was able to do so. Oral cavity checked for additional food-none noted. MD paged to make aware. Food and drinks withheld for evaluation.

## 2019-03-30 NOTE — Progress Notes (Signed)
ANTICOAGULATION CONSULT NOTE   Pharmacy Consult for Heparin Indication: pulmonary embolus  No Known Allergies  Patient Measurements: Height: 5\' 6"  (167.6 cm) Weight: 175 lb (79.4 kg) IBW/kg (Calculated) : 63.8 Heparin Dosing Weight: 79.4 kg  Vital Signs: Temp: 97.7 F (36.5 C) (11/22 0324) Temp Source: Oral (11/22 0324) BP: 92/63 (11/22 0324) Pulse Rate: 92 (11/22 0324)  Labs: Recent Labs    03/27/19 1338 03/28/19 0117 03/28/19 1514 03/28/19 1540 03/29/19 0403 03/29/19 1017 03/30/19 0405  HGB 14.3 12.8*  --   --  13.2  --  12.0*  HCT 46.0 40.1  --   --  42.3  --  39.3  PLT 103* 102*  --   --  115*  --  124*  APTT 193* 110* 131*  --   --   --   --   HEPARINUNFRC 1.14* 0.47  --  0.45  --  0.41 0.15*  CREATININE  --  1.15 1.18  --  1.14  --  1.03    Estimated Creatinine Clearance: 51.9 mL/min (by C-G formula based on SCr of 1.03 mg/dL).   Medical History: Past Medical History:  Diagnosis Date  . Asthma    as a child  . Cataract   . Dementia ([place])   . Diabetes mellitus without complication ([place])   . HOH (hard of hearing)   . Hypertension     Medications:  Scheduled:  . cholecalciferol  1,000 Units Oral Daily  . dexamethasone (DECADRON) injection  6 mg Intravenous Q24H  . folic acid  1 mg Oral Daily  . free water  200 mL Oral QID  . insulin aspart  0-15 Units Subcutaneous TID WC  . insulin aspart  0-5 Units Subcutaneous QHS  . insulin aspart  3 Units Subcutaneous TID WC  . insulin glargine  7 Units Subcutaneous Q24H  . sodium chloride flush  10-40 mL Intracatheter Q12H  . sodium chloride flush  3 mL Intravenous Q12H  . vitamin C  500 mg Oral Daily  . zinc sulfate  220 mg Oral Daily    Assessment: Patient is a 17 yom that has been diagnosed with COVID from his nursing home. The patient had and elevated D-Dimer and was found to have a PE with some evidence of right heart strain. Pharmacy has been asked to dose heparin for this patient.   Concerning  labs noted - Heparin level this AM is high at 1.01 after bolus and drip initiation yesterday evening.  Yesterday AM, labs resulted in a prolonged aPTT >200 and elevated heparin level 1.09 about 20 minutes after Lovenox 30mg  SQ was given. Patient was not on anticoagulation prior to admission. Reduced rate and repeated APTT. Repeat APTT still prolonged at 196. Patient is not having any symptoms of bleeding or overt bleeding.  Discussed with Dr. Florene Glen - will recheck levels now and if remain prolonged, would be ok with holding heparin for 30 minutes and then resuming at reduced rate.    11/22 AM update:  Heparin level is sub-therapeutic this AM  Goal of Therapy:  Heparin level 0.3-0.7 units/ml Monitor platelets by anticoagulation protocol: Yes   Plan:  -Inc heparin to 1050 units/hr -Re-check heparin level at [place], PharmD, [place] Pharmacist Phone: [phone]

## 2019-03-31 ENCOUNTER — Inpatient Hospital Stay (HOSPITAL_COMMUNITY): Payer: Medicare Other

## 2019-03-31 LAB — COMPREHENSIVE METABOLIC PANEL
ALT: 23 U/L (ref 0–44)
AST: 21 U/L (ref 15–41)
Albumin: 2.2 g/dL — ABNORMAL LOW (ref 3.5–5.0)
Alkaline Phosphatase: 41 U/L (ref 38–126)
Anion gap: 4 — ABNORMAL LOW (ref 5–15)
BUN: 7 mg/dL — ABNORMAL LOW (ref 8–23)
CO2: 25 mmol/L (ref 22–32)
Calcium: 8.4 mg/dL — ABNORMAL LOW (ref 8.9–10.3)
Chloride: 112 mmol/L — ABNORMAL HIGH (ref 98–111)
Creatinine, Ser: 1.01 mg/dL (ref 0.61–1.24)
GFR calc Af Amer: >60 mL/min (ref >60)
GFR calc non Af Amer: >60 mL/min (ref >60)
Glucose, Bld: 161 mg/dL — ABNORMAL HIGH (ref 70–99)
Potassium: 3.6 mmol/L (ref 3.5–5.1)
Sodium: 141 mmol/L (ref 135–145)
Total Bilirubin: 0.7 mg/dL (ref 0.3–1.2)
Total Protein: 5.9 g/dL — ABNORMAL LOW (ref 6.5–8.1)

## 2019-03-31 LAB — CBC WITH DIFFERENTIAL/PLATELET
Abs Immature Granulocytes: 0.07 10*3/uL (ref 0.00–0.07)
Basophils Absolute: 0 10*3/uL (ref 0.0–0.1)
Basophils Relative: 0 %
Eosinophils Absolute: 0.1 10*3/uL (ref 0.0–0.5)
Eosinophils Relative: 1 %
HCT: 37.9 % — ABNORMAL LOW (ref 39.0–52.0)
Hemoglobin: 11.9 g/dL — ABNORMAL LOW (ref 13.0–17.0)
Immature Granulocytes: 1 %
Lymphocytes Relative: 14 %
Lymphs Abs: 1.1 10*3/uL (ref 0.7–4.0)
MCH: 27.6 pg (ref 26.0–34.0)
MCHC: 31.4 g/dL (ref 30.0–36.0)
MCV: 87.9 fL (ref 80.0–100.0)
Monocytes Absolute: 0.9 10*3/uL (ref 0.1–1.0)
Monocytes Relative: 11 %
Neutro Abs: 5.9 10*3/uL (ref 1.7–7.7)
Neutrophils Relative %: 73 %
Platelets: 146 10*3/uL — ABNORMAL LOW (ref 150–400)
RBC: 4.31 MIL/uL (ref 4.22–5.81)
RDW: 13.8 % (ref 11.5–15.5)
WBC: 8 10*3/uL (ref 4.0–10.5)
nRBC: 0 % (ref 0.0–0.2)

## 2019-03-31 LAB — GLUCOSE, CAPILLARY
Glucose-Capillary: 200 mg/dL — ABNORMAL HIGH (ref 70–99)
Glucose-Capillary: 154 mg/dL — ABNORMAL HIGH (ref 70–99)
Glucose-Capillary: 131 mg/dL — ABNORMAL HIGH (ref 70–99)
Glucose-Capillary: 212 mg/dL — ABNORMAL HIGH (ref 70–99)

## 2019-03-31 LAB — MAGNESIUM: Magnesium: 1.9 mg/dL (ref 1.7–2.4)

## 2019-03-31 LAB — D-DIMER, QUANTITATIVE: D-Dimer, Quant: 4.89 ug/mL-FEU — ABNORMAL HIGH (ref 0.00–0.50)

## 2019-03-31 LAB — C-REACTIVE PROTEIN: CRP: 9.6 mg/dL — ABNORMAL HIGH (ref <1.0)

## 2019-03-31 LAB — FERRITIN: Ferritin: 538 ng/mL — ABNORMAL HIGH (ref 24–336)

## 2019-03-31 MED ORDER — ENSURE ENLIVE PO LIQD
237.0000 mL | Freq: Two times a day (BID) | ORAL | Status: DC
  Administered 2019-04-01 (×2): 237 mL via ORAL

## 2019-03-31 MED ORDER — APIXABAN (ELIQUIS) EDUCATION KIT FOR DVT/PE PATIENTS
PACK | Freq: Once | Status: DC
  Filled 2019-03-31 (×2): qty 1

## 2019-03-31 MED ORDER — ADULT MULTIVITAMIN W/MINERALS CH
1.0000 | ORAL_TABLET | Freq: Every day | ORAL | Status: DC
  Administered 2019-03-31 – 2019-04-01 (×2): 1 via ORAL
  Filled 2019-03-31 (×2): qty 1

## 2019-03-31 MED ORDER — FREE WATER
200.0000 mL | Freq: Three times a day (TID) | Status: DC
  Administered 2019-04-01: 200 mL via ORAL

## 2019-03-31 NOTE — Progress Notes (Addendum)
Patient denied all medications including eliquis and 2 units of insulin. He stated that he would be more open to taking them in the morning.

## 2019-03-31 NOTE — Progress Notes (Signed)
Initial Nutrition Assessment  INTERVENTION:   -Ensure Enlive po BID, each supplement provides 350 kcal and 20 grams of protein -Multivitamin with minerals daily  NUTRITION DIAGNOSIS:   Increased nutrient needs related to acute illness as evidenced by estimated needs.  GOAL:   Patient will meet greater than or equal to 90% of their needs  MONITOR:   PO intake, Supplement acceptance, Labs, Weight trends, I & O's  REASON FOR ASSESSMENT:   Malnutrition Screening Tool    ASSESSMENT:   85 y.o. male with medical history significant of diabetes mellitus type 2 hypertension dementia, nursing home resident, was diagnosed with Covid positive at nursing home. 11/10 -COVID+  **RD working remotely**  Patient from memory care unit at nursing home. Pt has been COVID-19 positive since 11/10.  Per SLP note on 11/22, pt observed with prolonged mastication and pocketing boluses. Mild aspiration risk. Recommended dysphagia 1 diet with thin liquids. Pt consumed 90% of lunch today (providing ~800 kcals and 40g protein). Will order Ensure supplements for additional kcals and protein.   Per weight records, pt has lost 12 lbs since 2/14 (6% wt loss x 9 months, insignificant for time frame).  I/Os: +1.5L since admit UOP: 400 ml x 24 hrs  Medications: Vitamin D tablet, folic acid tablet, Free water PO 200 ml QID, Vitamin C tablet, Zinc sulfate capsule Labs reviewed: CBGs: 154-200   NUTRITION - FOCUSED PHYSICAL EXAM:  Working remotely.  Diet Order:   Diet Order            DIET - DYS 1 Room service appropriate? Yes; Fluid consistency: Thin  Diet effective now              EDUCATION NEEDS:   No education needs have been identified at this time  Skin:  Skin Assessment: Reviewed RN Assessment  Last BM:  PTA  Height:   Ht Readings from Last 1 Encounters:  03/25/19 5\' 6"  (1.676 m)    Weight:   Wt Readings from Last 1 Encounters:  03/25/19 79.4 kg    Ideal Body Weight:   64.5 kg  BMI:  Body mass index is 28.25 kg/m.  Estimated Nutritional Needs:   Kcal:  1700-1900  Protein:  80-90g  Fluid:  1.7L/day  Clayton Bibles, MS, RD, LDN Inpatient Clinical Dietitian Pager: [phone] After Hours Pager: [phone]

## 2019-03-31 NOTE — NC FL2 (Signed)
[place] LEVEL OF CARE SCREENING TOOL     IDENTIFICATION  Patient Name: Tyler Jimenez Birthdate: May 03, 1932 Sex: male Admission Date (Current Location): 03/25/2019  [facility] and [place] Number:  Herbalist and Address:  [facility] [place], [place] [address] [place], [place] [address]      Provider Number: [id number]  Attending Physician Name and Address:  Elodia Florence., *  Relative Name and Phone Number:  Governor Specking, Daughter, [phone]    Current Level of Care: Hospital Recommended Level of Care: Memory Care Prior Approval Number:    Date Approved/Denied:   PASRR Number:    Discharge Plan: Other (Comment)(Memory Care)    Current Diagnoses: Patient Active Problem List   Diagnosis Date Noted  . Acute deep vein thrombosis (DVT) of lower extremity ([place])   . Acute pulmonary embolism with acute cor pulmonale (HCC)   . COVID-19 virus infection 03/26/2019  . DKA, type 2 ([place]) 03/25/2019  . AKI (acute kidney injury) ([place]) 03/25/2019  . Right lower lobe pulmonary infiltrate 03/25/2019  . COVID-19 03/25/2019  . Healthcare-associated pneumonia 03/25/2019  . Urinary tract infection with hematuria 01/26/2019  . Loss of appetite 01/26/2019  . Current moderate episode of major depressive disorder without prior episode ([place]) 01/26/2019  . Fall 09/21/2018  . Encounter for health maintenance examination with abnormal findings 04/29/2018  . Dysuria 04/29/2018  . Uncontrolled type 2 diabetes mellitus with hyperglycemia ([place]) 10/17/2017  . Tinea pedis of both feet 10/17/2017  . Dementia with behavioral disturbance ([place]) 04/25/2017  . DM2 (diabetes mellitus, type 2) ([place]) 04/25/2017  . Essential hypertension 04/25/2017  . Cardiac murmur 04/25/2017  . Mixed hyperlipidemia 04/25/2017    Orientation RESPIRATION BLADDER Height & Weight     Self  O2(98, [place], 4L) Incontinent Weight: 175 lb (79.4 kg) Height:  _0  (167.6  cm)  BEHAVIORAL SYMPTOMS/MOOD NEUROLOGICAL BOWEL NUTRITION STATUS      Continent Diet(puree solids, thin liquids)  AMBULATORY STATUS COMMUNICATION OF NEEDS Skin   Limited Assist Verbally Normal                       Personal Care Assistance Level of Assistance  Bathing, Feeding, Dressing Bathing Assistance: Limited assistance Feeding assistance: Independent Dressing Assistance: Limited assistance    Functional Limitations Info  Sight, Hearing, Speech Sight Info: Adequate Hearing Info: Adequate Speech Info: Adequate    SPECIAL CARE FACTORS FREQUENCY  PT (By licensed PT), OT (By licensed OT), Speech therapy     PT Frequency: 3x/wk with home health OT Frequency: 3x/wk with home health     Speech Therapy Frequency: 3x/wk with home health      Contractures Contractures Info: Not present    Additional Factors Info  Code Status, Allergies, Isolation Precautions, Insulin Sliding Scale Code Status Info: Full Code Allergies Info: No Known Allergies   Insulin Sliding Scale Info: insulin aspart noolog 0-15 units 3x daily w/meals; insulin aspart novolog 0-5 units daily at bedtime; insulin glargine lantus 7 units every 24 hours Isolation Precautions Info: COVID +     Current Medications (03/31/2019):  This is the current hospital active medication list Current Facility-Administered Medications  Medication Dose Route Frequency Provider Last Rate Last Dose  . apixaban Arne Cleveland) Education Kit for DVT/PE patients   Does not apply Once Harvel Quale, [facility]      . apixaban (ELIQUIS) tablet 10 mg  10 mg Oral BID Elodia Florence., MD  10 mg at 03/31/19 0842   Followed by  . [START ON 04/06/2019] apixaban (ELIQUIS) tablet 5 mg  5 mg Oral BID Elodia Florence., MD      . cholecalciferol (VITAMIN D3) tablet 1,000 Units  1,000 Units Oral Daily Elodia Florence., MD   1,000 Units at 03/31/19 [phone]  . dexamethasone (DECADRON) injection 6 mg  6 mg Intravenous Q24H  Cristescu, Linard Millers, MD   6 mg at 03/30/19 2127  . dextrose 5 % in lactated ringers infusion   Intravenous Continuous Elodia Florence., MD 75 mL/hr at 03/31/19 1307    . dextrose 50 % solution 0-50 mL  0-50 mL Intravenous PRN Drenda Freeze, MD      . dextrose 50 % solution 0-50 mL  0-50 mL Intravenous PRN Cristescu, Linard Millers, MD   50 mL at 03/30/19 1646  . folic acid (FOLVITE) tablet 1 mg  1 mg Oral Daily Cristescu, Linard Millers, MD   1 mg at 03/31/19 0842  . free water 200 mL  200 mL Oral QID Elodia Florence., MD   200 mL at 03/30/19 1733  . insulin aspart (novoLOG) injection 0-15 Units  0-15 Units Subcutaneous TID WC Elodia Florence., MD   3 Units at 03/31/19 1304  . insulin aspart (novoLOG) injection 0-5 Units  0-5 Units Subcutaneous QHS Elodia Florence., MD      . insulin glargine (LANTUS) injection 7 Units  7 Units Subcutaneous Q24H Elodia Florence., MD   7 Units at 03/31/19 1304  . sodium chloride flush (NS) 0.9 % injection 10-40 mL  10-40 mL Intracatheter Q12H Elodia Florence., MD   10 mL at 03/28/19 1411  . sodium chloride flush (NS) 0.9 % injection 10-40 mL  10-40 mL Intracatheter PRN Elodia Florence., MD      . sodium chloride flush (NS) 0.9 % injection 3 mL  3 mL Intravenous Q12H Cristescu, Linard Millers, MD   3 mL at 03/31/19 0849  . vitamin C (ASCORBIC ACID) tablet 500 mg  500 mg Oral Daily Cristescu, Linard Millers, MD   500 mg at 03/31/19 0842  . zinc sulfate capsule 220 mg  220 mg Oral Daily Cristescu, Linard Millers, MD   220 mg at 03/31/19 8841     Discharge Medications: Please see discharge summary for a list of discharge medications.  Relevant Imaging Results:  Relevant Lab Results:   Additional Information SSN: [id number]  Geralynn Ochs, LCSW

## 2019-03-31 NOTE — Progress Notes (Signed)
PROGRESS NOTE    Tyler Jimenez  YNW:[id number] DOB: Aug 25, 1931 DOA: 03/25/2019 PCP: Lyndon Code, MD   Brief Narrative:  Tyler Jimenez is Tyler Jimenez 85 y.o. male with medical history significant of diabetes mellitus type 2 hypertension dementia, nursing home resident, was diagnosed with Covid positive at nursing home.  Was started on Decadron.  Patient has history of diabetes mellitus type 2 is on insulin.  His blood sugar went up to 780.  With anion gap 16 CO2 21.  Same time patient has acute kidney injury secondary to dehydration.  His acidosis is Saima Monterroso combination of mild DKA and acute kidney injury.  Chest x-ray showed right lower lobe infiltrate Patient was started on Endo tool protocol, given Maxipime for his pneumonia, repeat Covid test. Emergency room physician discussed the case with the family no remdesivir.  Pt initially to be transferred to [facility], but daughter declined this.  Will keep [facility] at [facility] for now.  Assessment & Plan:   Active Problems:   Dementia with behavioral disturbance (HCC)   Essential hypertension   Mixed hyperlipidemia   Uncontrolled type 2 diabetes mellitus with hyperglycemia (HCC)   DKA, type 2 (HCC)   AKI (acute kidney injury) (HCC)   Right lower lobe pulmonary infiltrate   COVID-19   Healthcare-associated pneumonia   COVID-19 virus infection   Acute pulmonary embolism with acute cor pulmonale (HCC)   Acute deep vein thrombosis (DVT) of lower extremity (HCC)  COVID 19 Pneumonia  Acute Hypoxic Respiratory Failure  Submassive PE with extensive Right Lower Extremity DVT: Continue steroids (dexamethasone day 6) - pt comfortable on RA Pt daughter does not want remdesivir - she's concerned regarding side effects - had long conversation with her regarding this.  Will hold off on remdesivir at this time. CT chest with large volume bilateral PE - R heart strain.  Peripheral ground glass opacities. Daily labs - notable for improved d dimer, stable ferritin,  rising CRP - stable Procalcitonin <0.1, low suspicion for bacterial pneumonia - will d/c cefepime and follow Continue heparin gtt per pharmacy (plan for at least 3 days of IV heparin with submassive PE and extensive RLE DVT 11/18 - present).  Continue eliquis. Echo demonstrates mildly reduced RV systolic function and concern for mcconnell's sign  LE [place] also shows an extensive right lower extremity DVT extending to right femoral vein Given hemodynamic stability and stable O2 needs, no indication for lytics at this point (he'd be high risk given his age)  COVID-19 Labs  Recent Labs    03/29/19 0403 03/30/19 0405 03/31/19 1236  DDIMER 12.23* 7.93* 4.89*  FERRITIN 566* 568* 538*  CRP 7.2* 8.3* 9.6*    Lab Results  Component Value Date   SARSCOV2NAA POSITIVE (Billiejean Schimek) 03/25/2019   SARSCOV2NAA NEGATIVE 02/04/2019   Dementia  Delirium: delirium precautions, follow - he's impulsive - sitter ordered.  Ativan prn agitation.  QTc prolonged, precludes antipsychotics.   11/22 less interactive - normal TSH, ammonia, folate, elevated B12 - VBG ordered, but not collected 11/23 appears closer to baseline Will d/c sitter today  Concern for Aspiration: SLP recommending dysphagia 1 diet, thin liquids.  Precautions (small bites/sips, slow rate of intake, feed only when awake/alert, sit upright 90 degrees).  CXR with patchy airspace bilaterally, slightly increased in R mid lung - follow, afebrile  Mild DKA  T2DM: improved at this time.  A1c 8.9. lantus 7 units - BG's reasonable, follow.  SSI.  Acute Kidney Injury  Hypernatremia: Baseline creatinine appears about 1.37.  1.85 at peak.  Improving to 1.03 today.   Hypernatremia stable Encourage PO intake and free water  Lactic Acidosis: improved, but not resolved.  Pt appears stable.  Will stop following at this time.    Hypertension:  Amlodipine on hold.  BP reasonable, follow.   Thrombocytopenia: Possibly related to viral infection.  Continue to  monitor.   DVT prophylaxis: lovenox - heparin gtt Code Status: full, confirmed with daughter 11/18 Family Communication: daughter 11/22 - no answer 11/23 from daughter Disposition Plan: pending further improvement  Consultants:   none  Procedures:   none  Antimicrobials:  Anti-infectives (From admission, onward)   Start     Dose/Rate Route Frequency Ordered Stop   03/26/19 1930  ceFEPIme (MAXIPIME) 2 g in sodium chloride 0.9 % 100 mL IVPB  Status:  Discontinued     2 g 200 mL/hr over 30 Minutes Intravenous Every 24 hours 03/25/19 2005 03/25/19 2009   03/26/19 0830  ceFEPIme (MAXIPIME) 2 g in sodium chloride 0.9 % 100 mL IVPB  Status:  Discontinued     2 g 200 mL/hr over 30 Minutes Intravenous Every 12 hours 03/25/19 2009 03/26/19 1859   03/25/19 1745  vancomycin (VANCOCIN) 1,500 mg in sodium chloride 0.9 % 500 mL IVPB     1,500 mg 250 mL/hr over 120 Minutes Intravenous  Once 03/25/19 1739 03/25/19 2154   03/25/19 1730  vancomycin (VANCOCIN) IVPB 1000 mg/200 mL premix  Status:  Discontinued     1,000 mg 200 mL/hr over 60 Minutes Intravenous  Once 03/25/19 1727 03/25/19 1739   03/25/19 1730  ceFEPIme (MAXIPIME) 2 g in sodium chloride 0.9 % 100 mL IVPB     2 g 200 mL/hr over 30 Minutes Intravenous  Once 03/25/19 1727 03/25/19 1957     Subjective: No complaints today.  Objective: Vitals:   03/31/19 0700 03/31/19 0831 03/31/19 1000 03/31/19 1300  BP: 122/81 125/67 129/64 136/63  Pulse:      Resp: 14 18 17 14   Temp:  97.7 F (36.5 C)    TempSrc:  Oral    SpO2: 96% 98% 100% 99%  Weight:      Height:        Intake/Output Summary (Last 24 hours) at 03/31/2019 1911 Last data filed at 03/31/2019 1445 Gross per 24 hour  Intake 240 ml  Output 400 ml  Net -160 ml   Filed Weights   03/25/19 1700  Weight: 79.4 kg    Examination:  General: No acute distress. Cardiovascular: RRR Lungs: unlabored Abdomen: Soft, nontender, nondistended  Neurological: Alert and  disoriented, difficult to understand. Moves all extremities 4. Cranial nerves II through XII grossly intact. Skin: Warm and dry. No rashes or lesions. Extremities: No clubbing or cyanosis. No edema.   Data Reviewed: I have personally reviewed following labs and imaging studies  CBC: Recent Labs  Lab 03/27/19 0500 03/27/19 1338 03/28/19 0117 03/29/19 0403 03/30/19 0405 03/31/19 1236  WBC 12.6* 16.3* 13.5* 9.7 9.0 8.0  NEUTROABS 11.1*  --  11.6* 7.2 6.4 5.9  HGB 12.0* 14.3 12.8* 13.2 12.0* 11.9*  HCT 40.3 46.0 40.1 42.3 39.3 37.9*  MCV 90.8 87.6 86.2 87.6 88.1 87.9  PLT 96* 103* 102* 115* 124* 146*   Basic Metabolic Panel: Recent Labs  Lab 03/26/19 0418  03/27/19 0500  03/28/19 0117 03/28/19 1514 03/29/19 0403 03/30/19 0405 03/31/19 1236  NA 157*   < > 133*   < > 150* 149* 148* 148* 141  K 4.9   < >  4.5   < > 3.8 3.9 3.5 4.0 3.6  CL 119*   < > 99   < > 118* 116* 114* 116* 112*  CO2 24   < > 23   < > 22 24 22 24 25   GLUCOSE 163*   < > 724*   < > 113* 205* 198* 159* 161*  BUN 41*   < > 24*   < > 21 24* 19 14 7*  CREATININE 1.43*   < > 1.22   < > 1.15 1.18 1.14 1.03 1.01  CALCIUM 9.7   < > 8.4*   < > 8.9 8.8* 8.7* 8.7* 8.4*  MG 2.6*  --  1.9  --  2.1  --  2.1 2.0 1.9  PHOS 4.3  --  3.7  --  3.7  --  3.8 3.1  --    < > = values in this interval not displayed.   GFR: Estimated Creatinine Clearance: 52.9 mL/min (by C-G formula based on SCr of 1.01 mg/dL). Liver Function Tests: Recent Labs  Lab 03/27/19 0500 03/28/19 0117 03/29/19 0403 03/30/19 0405 03/31/19 1236  AST 30 29 20 18 21   ALT 31 31 27 22 23   ALKPHOS 44 41 42 42 41  BILITOT 1.1 0.8 0.7 0.6 0.7  PROT 5.8* 5.9* 6.3* 5.9* 5.9*  ALBUMIN 2.4* 2.5* 2.4* 2.3* 2.2*   Recent Labs  Lab 03/25/19 1620  LIPASE 68*   Recent Labs  Lab 03/30/19 1226  AMMONIA 16   Coagulation Profile: No results for input(s): INR, PROTIME in the last 168 hours. Cardiac Enzymes: No results for input(s): CKTOTAL, CKMB,  CKMBINDEX, TROPONINI in the last 168 hours. BNP (last 3 results) No results for input(s): PROBNP in the last 8760 hours. HbA1C: No results for input(s): HGBA1C in the last 72 hours. CBG: Recent Labs  Lab 03/30/19 1704 03/30/19 2132 03/31/19 0836 03/31/19 1246 03/31/19 1658  GLUCAP 118* 133* 200* 154* 131*   Lipid Profile: No results for input(s): CHOL, HDL, LDLCALC, TRIG, CHOLHDL, LDLDIRECT in the last 72 hours. Thyroid Function Tests: Recent Labs    03/30/19 1226  TSH 0.976   Anemia Panel: Recent Labs    03/30/19 0405 03/30/19 1226 03/31/19 1236  VITAMINB12  --  1,348*  --   FOLATE  --  39.8  --   FERRITIN 568*  --  538*   Sepsis Labs: Recent Labs  Lab 03/26/19 0050 03/27/19 0327 03/27/19 0819 03/27/19 1338 03/28/19 0117  PROCALCITON <0.10  --   --   --   --   LATICACIDVEN 3.1* 2.6* 3.2* 3.8* 2.5*    Recent Results (from the past 240 hour(s))  SARS CORONAVIRUS 2 (TAT 6-24 HRS) Nasopharyngeal Nasopharyngeal Swab     Status: Abnormal   Collection Time: 03/25/19  5:27 PM   Specimen: Nasopharyngeal Swab  Result Value Ref Range Status   SARS Coronavirus 2 POSITIVE (Madissen Wyse) NEGATIVE Final    Comment: RESULT CALLED TO, READ BACK BY AND VERIFIED WITH: RN Kristion Holifield MCKEOWN @0826  03/26/19 BY S GEZAHEGN (NOTE) SARS-CoV-2 target nucleic acids are DETECTED. The SARS-CoV-2 RNA is generally detectable in upper and lower respiratory specimens during the acute phase of infection. Positive results are indicative of active infection with SARS-CoV-2. Clinical  correlation with patient history and other diagnostic information is necessary to determine patient infection status. Positive results do  not rule out bacterial infection or co-infection with other viruses. The expected result is Negative. Fact Sheet for Patients: 03/30/19 Fact Sheet for  Healthcare Providers: quierodirigir.com This test is not yet approved or cleared by  the [place] and  has been authorized for detection and/or diagnosis of SARS-CoV-2 by FDA under an Emergency Use Authorization (EUA). This EUA will remain  in effect (meaning this test can be used)  for the duration of the COVID-19 declaration under Section 564(b)(1) of the Act, 21 U.S.C. section 360bbb-3(b)(1), unless the authorization is terminated or revoked sooner. Performed at [facility], [address], [place], [place] [address]   Blood culture (routine x 2)     Status: None   Collection Time: 03/25/19  7:09 PM   Specimen: BLOOD  Result Value Ref Range Status   Specimen Description BLOOD RIGHT ANTECUBITAL  Final   Special Requests   Final    BOTTLES DRAWN AEROBIC AND ANAEROBIC Blood Culture results may not be optimal due to an inadequate volume of blood received in culture bottles   Culture   Final    NO GROWTH 5 DAYS Performed at [facility], [address] [place], [place] [address]    Report Status 03/30/2019 FINAL  Final  Blood culture (routine x 2)     Status: None   Collection Time: 03/25/19  7:14 PM   Specimen: BLOOD  Result Value Ref Range Status   Specimen Description BLOOD LEFT ANTECUBITAL  Final   Special Requests   Final    BOTTLES DRAWN AEROBIC AND ANAEROBIC Blood Culture adequate volume   Culture   Final    NO GROWTH 5 DAYS Performed at [facility], [address], [place], [place] [address]    Report Status 03/30/2019 FINAL  Final  MRSA PCR Screening     Status: None   Collection Time: 03/28/19 12:58 AM   Specimen: Nasopharyngeal  Result Value Ref Range Status   MRSA by PCR NEGATIVE NEGATIVE Final    Comment:        The GeneXpert MRSA Assay (FDA approved for NASAL specimens only), is one component of [facility] comprehensive MRSA colonization surveillance program. It is not intended to diagnose MRSA infection nor to guide or monitor treatment for MRSA infections. Performed at [facility], [address],  [place], [place] [address]          Radiology Studies: Dg Chest Port 1 View  Result Date: 03/31/2019 CLINICAL DATA:  COVID-19 positive EXAM: PORTABLE CHEST 1 VIEW COMPARISON:  March 30, 2019 and March 25, 2019 FINDINGS: Patchy airspace opacity is noted in the left mid lower lung zones. More subtle ill-defined opacity noted in the right mid lung and right base regions. Subtle increased opacity in the right mid lung compared to 1 day prior. Heart is slightly enlarged with pulmonary vascularity normal. No adenopathy. Thoracic aorta is mildly prominent but stable. No adenopathy. No bone lesions. IMPRESSION: Patchy airspace opacity bilaterally, slightly increased in the right mid lung and essentially stable elsewhere. Stable cardiac prominence. Thoracic aortic prominence suggest chronic hypertensive change. No adenopathy. Electronically Signed   By: Bretta Bang III M.D.   On: 03/31/2019 08:26   Dg Chest Port 1 View  Result Date: 03/30/2019 CLINICAL DATA:  Aspiration. EXAM: PORTABLE CHEST 1 VIEW COMPARISON:  03/25/2019 FINDINGS: 1430 hours. The cardio pericardial silhouette is enlarged. Interstitial markings are diffusely coarsened with chronic features. Basilar subtle airspace opacity in the right lower lung is stable with interval progression of airspace opacity at the left base. No substantial pleural effusion. The visualized bony structures of  the thorax are intact. Telemetry leads overlie the chest. IMPRESSION: 1. Bibasilar airspace disease, progressive on the left. Electronically Signed   By: Misty Stanley M.D.   On: 03/30/2019 14:56        Scheduled Meds: . apixaban   Does not apply Once  . apixaban  10 mg Oral BID   Followed by  . [START ON 04/06/2019] apixaban  5 mg Oral BID  . cholecalciferol  1,000 Units Oral Daily  . dexamethasone (DECADRON) injection  6 mg Intravenous Q24H  . [START ON 04/01/2019] feeding supplement (ENSURE ENLIVE)  237 mL Oral BID BM  . folic acid  1 mg  Oral Daily  . free water  200 mL Oral QID  . insulin aspart  0-15 Units Subcutaneous TID WC  . insulin aspart  0-5 Units Subcutaneous QHS  . insulin glargine  7 Units Subcutaneous Q24H  . multivitamin with minerals  1 tablet Oral Daily  . sodium chloride flush  10-40 mL Intracatheter Q12H  . sodium chloride flush  3 mL Intravenous Q12H  . vitamin C  500 mg Oral Daily  . zinc sulfate  220 mg Oral Daily   Continuous Infusions:    LOS: 6 days    Time spent: over 30 min    Fayrene Helper, MD [facility] Pager [email or web address]  If 7PM-7AM, please contact night-coverage [email or web address] Password [facility] 03/31/2019, 7:11 PM

## 2019-03-31 NOTE — TOC Progression Note (Signed)
Transition of Care [facility]) - Progression Note    Patient Details  Name: [name] MRN: [id number] Date of Birth: Nov 19, 1931  Transition [facility]) CM/SW [place], [place] Phone Number: 03/31/2019, 4:25 PM  Clinical Narrative:   CSW spoke with Shelton Silvas at [facility] to discuss patient's return. CSW discussed patient's functioning, and Shelton Silvas confirmed that it seems that patient is near his baseline with his mobility. CSW provided update on puree diet established today as patient was pocketing food, and Shelton Silvas a little concerned that patient has gone from a regular to a puree diet, but they can accommodate the diet change. CSW confirmed that they can setup home health at [facility], and that they are able to accept the patient back COVID+. CSW completed initial FL2 and sent with home health orders per request. CSW to update upon discharge and send to [facility].    Expected Discharge Plan: Memory Care Barriers to Discharge: Continued Medical Work up  Expected Discharge Plan and Services Expected Discharge Plan: Memory Care In-house Referral: Clinical Social Work     Living arrangements for the past 2 months: [place]                                       Social Determinants of Health (SDOH) Interventions    Readmission Risk Interventions No flowsheet data found.

## 2019-04-01 LAB — CBC WITH DIFFERENTIAL/PLATELET
Abs Immature Granulocytes: 0.06 10*3/uL (ref 0.00–0.07)
Basophils Absolute: 0 10*3/uL (ref 0.0–0.1)
Basophils Relative: 0 %
Eosinophils Absolute: 0.1 10*3/uL (ref 0.0–0.5)
Eosinophils Relative: 1 %
HCT: 35.8 % — ABNORMAL LOW (ref 39.0–52.0)
Hemoglobin: 11.3 g/dL — ABNORMAL LOW (ref 13.0–17.0)
Immature Granulocytes: 1 %
Lymphocytes Relative: 15 %
Lymphs Abs: 1.2 10*3/uL (ref 0.7–4.0)
MCH: 27.8 pg (ref 26.0–34.0)
MCHC: 31.6 g/dL (ref 30.0–36.0)
MCV: 88 fL (ref 80.0–100.0)
Monocytes Absolute: 0.7 10*3/uL (ref 0.1–1.0)
Monocytes Relative: 9 %
Neutro Abs: 5.9 10*3/uL (ref 1.7–7.7)
Neutrophils Relative %: 74 %
Platelets: 160 10*3/uL (ref 150–400)
RBC: 4.07 MIL/uL — ABNORMAL LOW (ref 4.22–5.81)
RDW: 13.9 % (ref 11.5–15.5)
WBC: 7.9 10*3/uL (ref 4.0–10.5)
nRBC: 0 % (ref 0.0–0.2)

## 2019-04-01 LAB — COMPREHENSIVE METABOLIC PANEL
ALT: 26 U/L (ref 0–44)
AST: 22 U/L (ref 15–41)
Albumin: 2 g/dL — ABNORMAL LOW (ref 3.5–5.0)
Alkaline Phosphatase: 42 U/L (ref 38–126)
Anion gap: 7 (ref 5–15)
BUN: 10 mg/dL (ref 8–23)
CO2: 24 mmol/L (ref 22–32)
Calcium: 8.4 mg/dL — ABNORMAL LOW (ref 8.9–10.3)
Chloride: 111 mmol/L (ref 98–111)
Creatinine, Ser: 1.16 mg/dL (ref 0.61–1.24)
GFR calc Af Amer: >60 mL/min (ref >60)
GFR calc non Af Amer: 57 mL/min — ABNORMAL LOW (ref >60)
Glucose, Bld: 220 mg/dL — ABNORMAL HIGH (ref 70–99)
Potassium: 3.4 mmol/L — ABNORMAL LOW (ref 3.5–5.1)
Sodium: 142 mmol/L (ref 135–145)
Total Bilirubin: 0.7 mg/dL (ref 0.3–1.2)
Total Protein: 5.3 g/dL — ABNORMAL LOW (ref 6.5–8.1)

## 2019-04-01 LAB — MAGNESIUM: Magnesium: 1.9 mg/dL (ref 1.7–2.4)

## 2019-04-01 LAB — GLUCOSE, CAPILLARY
Glucose-Capillary: 186 mg/dL — ABNORMAL HIGH (ref 70–99)
Glucose-Capillary: 243 mg/dL — ABNORMAL HIGH (ref 70–99)
Glucose-Capillary: 193 mg/dL — ABNORMAL HIGH (ref 70–99)
Glucose-Capillary: 89 mg/dL (ref 70–99)

## 2019-04-01 LAB — FERRITIN: Ferritin: 444 ng/mL — ABNORMAL HIGH (ref 24–336)

## 2019-04-01 LAB — D-DIMER, QUANTITATIVE: D-Dimer, Quant: 5.28 ug/mL-FEU — ABNORMAL HIGH (ref 0.00–0.50)

## 2019-04-01 LAB — C-REACTIVE PROTEIN: CRP: 8.4 mg/dL — ABNORMAL HIGH (ref <1.0)

## 2019-04-01 MED ORDER — ELIQUIS DVT/PE STARTER PACK 5 MG PO TBPK: ORAL_TABLET | ORAL | 0 refills | Status: DC

## 2019-04-01 MED ORDER — ALUM & MAG HYDROXIDE-SIMETH 200-200-20 MG/5ML PO SUSP: 30.0000 mL | Freq: Four times a day (QID) | ORAL | 0 refills | Status: AC | PRN

## 2019-04-01 MED ORDER — VITAMIN D3 25 MCG PO TABS: 1000.0000 [IU] | ORAL_TABLET | Freq: Every day | ORAL | 0 refills | Status: DC

## 2019-04-01 MED ORDER — APIXABAN 5 MG PO TABS: 10.0000 mg | ORAL_TABLET | Freq: Two times a day (BID) | ORAL | 0 refills | Status: DC

## 2019-04-01 MED ORDER — ZINC SULFATE 220 (50 ZN) MG PO CAPS: 220.0000 mg | ORAL_CAPSULE | Freq: Every day | ORAL | 0 refills | Status: AC

## 2019-04-01 MED ORDER — ASCORBIC ACID 500 MG PO TABS: 500.0000 mg | ORAL_TABLET | Freq: Every day | ORAL | 0 refills | Status: AC

## 2019-04-01 MED ORDER — VITAMIN D3 25 MCG PO TABS: 1000.0000 [IU] | ORAL_TABLET | Freq: Every day | ORAL | 0 refills | Status: AC

## 2019-04-01 MED ORDER — APIXABAN 5 MG PO TABS: 5.0000 mg | ORAL_TABLET | Freq: Two times a day (BID) | ORAL | 0 refills | Status: DC

## 2019-04-01 MED ORDER — MIRTAZAPINE 7.5 MG PO TABS: 7.5000 mg | ORAL_TABLET | Freq: Every evening | ORAL | 3 refills | Status: AC | PRN

## 2019-04-01 MED ORDER — APIXABAN 5 MG PO TABS: ORAL_TABLET | ORAL | 0 refills | Status: DC

## 2019-04-01 MED ORDER — ASCORBIC ACID 500 MG PO TABS: 500.0000 mg | ORAL_TABLET | Freq: Every day | ORAL | 0 refills | Status: DC

## 2019-04-01 MED ORDER — ZINC SULFATE 220 (50 ZN) MG PO CAPS: 220.0000 mg | ORAL_CAPSULE | Freq: Every day | ORAL | 0 refills | Status: DC

## 2019-04-01 MED ORDER — POTASSIUM CHLORIDE CRYS ER 20 MEQ PO TBCR
40.0000 meq | EXTENDED_RELEASE_TABLET | Freq: Once | ORAL | Status: AC
  Administered 2019-04-01: 40 meq via ORAL
  Filled 2019-04-01: qty 2

## 2019-04-01 NOTE — Progress Notes (Signed)
Occupational Therapy Treatment Patient Details Name: Tyler Jimenez MRN: [id number] DOB: 1931/12/17 Today's Date: 04/01/2019    History of present illness 85 y.o. male with medical history significant of diabetes mellitus type 2 hypertension dementia, nursing home resident, was diagnosed with Covid positive at nursing home.   OT comments  Pt continues to present with decreased balance, cognition, and activity tolerance. Pt requiring increased cues this session compared to evaluation on Saturday. Pt requiring Min Guard A for safety at sink during grooming. Required Min A and RW for functional mobility. Feel pt would benefit from home environment as confusion seems to be increasing. Recommend return to memory care with follow up OT and will continue to follow acutely as admitted.    Follow Up Recommendations  SNF;Supervision/Assistance - 24 hour    Equipment Recommendations  Other (comment)(Defer to next venue)    Recommendations for Other Services PT consult    Precautions / Restrictions Precautions Precautions: Fall;Other (comment) Precaution Comments: watch sats Restrictions Weight Bearing Restrictions: No       Mobility Bed Mobility Overal bed mobility: Needs Assistance Bed Mobility: Supine to Sit     Supine to sit: Min guard;HOB elevated     General bed mobility comments: +rail, increased time, min guard for safety  Transfers Overall transfer level: Needs assistance Equipment used: None Transfers: Sit to/from Stand Sit to Stand: Min assist         General transfer comment: assist to power up and stabilize balance    Balance Overall balance assessment: Needs assistance Sitting-balance support: No upper extremity supported;Feet supported Sitting balance-Leahy Scale: Good     Standing balance support: Single extremity supported;During functional activity Standing balance-Leahy Scale: Fair                             ADL either performed or  assessed with clinical judgement   ADL Overall ADL's : Needs assistance/impaired     Grooming: Min guard;Standing;Wash/dry face Grooming Details (indicate cue type and reason): Min Guard A for safety. Cued pt to go brush his teeth at sink. Pt walking passed sink and required cues to return to sink. Pt then washing his face.                 Toilet Transfer: Minimal assistance;Ambulation;RW(simulated to recliner) Toilet Transfer Details (indicate cue type and reason): Min A for power up into standing and then for additional safety and balance.          Functional mobility during ADLs: Minimal assistance;+2 for safety/equipment;Rolling walker General ADL Comments: Pt presenting with decreased awareness and attention impacting his safe performance of ADLs     Vision       Perception     Praxis      Cognition Arousal/Alertness: Awake/alert Behavior During Therapy: WFL for tasks assessed/performed Overall Cognitive Status: Difficult to assess                                 General Comments: Pt requiring increased cues and time this session compared to last session. Pt following simple commands but with tendency to become distracted and then perform different tasks such as washing his face in stead of brushing his teeth        Exercises     Shoulder Instructions       General Comments SpO2 96-91% on RA    Pertinent Vitals/ Pain  Pain Assessment: Faces Faces Pain Scale: No hurt Pain Intervention(s): Monitored during session;Repositioned  Home Living                                          Prior Functioning/Environment              Frequency  Min 2X/week        Progress Toward Goals  OT Goals(current goals can now be found in the care plan section)  Progress towards OT goals: Progressing toward goals  Acute Rehab OT Goals Patient Stated Goal: "Go home" OT Goal Formulation: With patient Time For Goal  Achievement: 04/12/19 Potential to Achieve Goals: Good ADL Goals Pt Will Perform Grooming: with set-up;with supervision;standing Pt Will Perform Lower Body Dressing: with set-up;with supervision;sit to/from stand Pt Will Transfer to Toilet: with set-up;with supervision;ambulating;regular height toilet Pt Will Perform Toileting - Clothing Manipulation and hygiene: with set-up;with supervision;sit to/from stand;sitting/lateral leans Additional ADL Goal #1: Pt will demonstrate increased activity tolerance to perform three ADLs in standing with Supervision  Plan Discharge plan remains appropriate    Co-evaluation    PT/OT/SLP Co-Evaluation/Treatment: Yes Reason for Co-Treatment: For patient/therapist safety;To address functional/ADL transfers   OT goals addressed during session: ADL's and self-care      AM-PAC OT "6 Clicks" Daily Activity     Outcome Measure   Help from another person eating meals?: None Help from another person taking care of personal grooming?: A Little Help from another person toileting, which includes using toliet, bedpan, or urinal?: A Little Help from another person bathing (including washing, rinsing, drying)?: A Little Help from another person to put on and taking off regular upper body clothing?: A Little Help from another person to put on and taking off regular lower body clothing?: A Little 6 Click Score: 19    End of Session    OT Visit Diagnosis: Unsteadiness on feet (R26.81);Other abnormalities of gait and mobility (R26.89);Muscle weakness (generalized) (M62.81)   Activity Tolerance Patient tolerated treatment well;Patient limited by fatigue   Patient Left in chair;with call bell/phone within reach;with chair alarm set   Nurse Communication Mobility status        Time: 1212-1232 OT Time Calculation (min): 20 min  Charges: OT General Charges $OT Visit: 1 Visit OT Treatments $Self Care/Home Management : 8-22 mins  Sevan Mcbroom MSOT,  OTR/L Acute Rehab Pager: [phone] Office: [phone]   Theodoro Grist Senora Lacson 04/01/2019, 1:37 PM

## 2019-04-01 NOTE — Progress Notes (Signed)
Physical Therapy Treatment Patient Details Name: Tyler Jimenez MRN: [id number] DOB: 1932-01-10 Today's Date: 04/01/2019    History of Present Illness 85 y.o. male with medical history significant of diabetes mellitus type 2 hypertension dementia, nursing home resident, was diagnosed with Covid positive at nursing home.    PT Comments    Pt requires increased encouragement, for getting out of bed. OT involvement helpful as pt with decreased command follow. Pt able to perform appropriate tasks, however does not readily follow commands. Asked to brush teeth however washed face with no attempt to brush teeth even when provided tools. Pt will not walk for exercise, willing to walk to window to look out. Pt is mildly unsteady with all movement. Given pt's dementia PT recommends return to familiar surroundings as soon as possible. PT will continue to follow acutely.     Follow Up Recommendations  SNF(return to memory care)     Equipment Recommendations  None recommended by PT       Precautions / Restrictions Precautions Precautions: Fall;Other (comment) Precaution Comments: watch sats Restrictions Weight Bearing Restrictions: No    Mobility  Bed Mobility Overal bed mobility: Needs Assistance Bed Mobility: Supine to Sit     Supine to sit: Min guard;HOB elevated     General bed mobility comments: +rail, increased time, min guard for safety  Transfers Overall transfer level: Needs assistance Equipment used: None Transfers: Sit to/from Stand Sit to Stand: Min assist         General transfer comment: assist to power up and stabilize balance  Ambulation/Gait Ambulation/Gait assistance: Min assist Gait Distance (Feet): 25 Feet Assistive device: Rolling walker (2 wheeled) Gait Pattern/deviations: Step-through pattern;Decreased stride length Gait velocity: slowd Gait velocity interpretation: <1.31 ft/sec, indicative of household ambulator General Gait Details: min A for  maintaining balance with ambulation to sink and window, pt able to use RW, but turns away from it to sink and chair without bringing it with him         Balance Overall balance assessment: Needs assistance Sitting-balance support: No upper extremity supported;Feet supported Sitting balance-Leahy Scale: Good     Standing balance support: Single extremity supported;During functional activity Standing balance-Leahy Scale: Fair                              Cognition Arousal/Alertness: Awake/alert Behavior During Therapy: WFL for tasks assessed/performed Overall Cognitive Status: Difficult to assess                                 General Comments: Pt requiring increased cues and time this session compared to last session. Pt following simple commands but with tendency to become distracted and then perform different tasks such as washing his face in stead of brushing his teeth         General Comments General comments (skin integrity, edema, etc.): SpO2 96-91% on RA      Pertinent Vitals/Pain Pain Assessment: Faces Faces Pain Scale: No hurt Pain Intervention(s): Monitored during session           PT Goals (current goals can now be found in the care plan section) Acute Rehab PT Goals Patient Stated Goal: "Go home" PT Goal Formulation: With patient Time For Goal Achievement: 04/12/19 Potential to Achieve Goals: Good Progress towards PT goals: Progressing toward goals    Frequency    Min 2X/week  PT Plan Current plan remains appropriate    Co-evaluation PT/OT/SLP Co-Evaluation/Treatment: Yes Reason for Co-Treatment: For patient/therapist safety PT goals addressed during session: Mobility/safety with mobility OT goals addressed during session: ADL's and self-care      AM-PAC PT "6 Clicks" Mobility   Outcome Measure  Help needed turning from your back to your side while in a flat bed without using bedrails?: A Little Help needed  moving from lying on your back to sitting on the side of a flat bed without using bedrails?: A Little Help needed moving to and from a bed to a chair (including a wheelchair)?: A Little Help needed standing up from a chair using your arms (e.g., wheelchair or bedside chair)?: A Little Help needed to walk in hospital room?: A Little Help needed climbing 3-5 steps with a railing? : Total 6 Click Score: 16    End of Session   Activity Tolerance: Patient tolerated treatment well Patient left: in chair;with chair alarm set;with call bell/phone within reach Nurse Communication: Mobility status PT Visit Diagnosis: Unsteadiness on feet (R26.81);Muscle weakness (generalized) (M62.81)     Time: 4332-9518 PT Time Calculation (min) (ACUTE ONLY): 31 min  Charges:  $Gait Training: 8-22 mins                     Sampson Self B. Migdalia Dk PT, DPT Acute Rehabilitation Services Pager [phone] Office [phone]    [place] 04/01/2019, 1:50 PM

## 2019-04-01 NOTE — Progress Notes (Signed)
Inpatient Diabetes Program Recommendations  AACE/ADA: New Consensus Statement on Inpatient Glycemic Control (2015)  Target Ranges:  Prepandial:   less than 140 mg/dL      Peak postprandial:   less than 180 mg/dL (1-2 hours)      Critically ill patients:  140 - 180 mg/dL   Results for Tyler Jimenez, Tyler Jimenez (MRN [id number]) as of 04/01/2019 12:11  Ref. Range 03/31/2019 08:36 03/31/2019 12:46 03/31/2019 16:58 03/31/2019 21:19  Glucose-Capillary Latest Ref Range: 70 - 99 mg/dL 200 (H)  3 units NOVOLOG  154 (H)  3 units NOVOLOG +  7 units LANTUS  131 (H)  2 units NOVOLOG  212 (H)    Results for Tyler Jimenez, Tyler Jimenez (MRN [id number]) as of 04/01/2019 12:11  Ref. Range 04/01/2019 08:30 04/01/2019 11:21  Glucose-Capillary Latest Ref Range: 70 - 99 mg/dL 186 (H)  3 units NOVOLOG +  7 units LANTUS  243 (H)  5  units NOVOLOG      SNF DM Meds: Amaryl 2 mg Daily                           Kombiglyze XR 09/998 mg Daily                           Actos 45 mg Daily                           Decadron 6 mg BID (course started 11/14 and supposed to end 11/24 for COVID)   Current Orders: Lantus 7 units Daily      Novolog Moderate Correction Scale/ SSI (0-15 units) TID AC + HS     MD- Note patient started on Ensure Enlive PO supplements this AM (BID between meals)  CBG elevated at 11am after pt drank the PO supp this AM  MD- Would you consider switching patient to either Glucerna or Ensure Max PO supplements to help improve CBG control?      --Will follow patient during hospitalization--  Wyn Quaker RN, MSN, CDE Diabetes Coordinator Inpatient Glycemic Control Team Team Pager: [phone] (8a-5p)

## 2019-04-01 NOTE — Discharge Instructions (Signed)
You should quarantine until December 8th.  If you don't have any symptoms for 24 hours at that time without using any medications, you can discontinue your quarantine.  Call the health department if you have any questions.     Person Under Monitoring Name: Tyler Jimenez  Location: [address] [place] [address]   Infection Prevention Recommendations for Individuals Confirmed to have, or Being Evaluated for, 2019 Novel Coronavirus (COVID-19) Infection Who Receive Care at Home  Individuals who are confirmed to have, or are being evaluated for, COVID-19 should follow the prevention steps below until Tyler Jimenez healthcare provider or local or state health department says they can return to normal activities.  Stay home except to get medical care You should restrict activities outside your home, except for getting medical care. Do not go to work, school, or public areas, and do not use public transportation or taxis.  Call ahead before visiting your doctor Before your medical appointment, call the healthcare provider and tell them that you have, or are being evaluated for, COVID-19 infection. This will help the healthcare providers office take steps to keep other people from getting infected. Ask your healthcare provider to call the local or state health department.  Monitor your symptoms Seek prompt medical attention if your illness is worsening (e.g., difficulty breathing). Before going to your medical appointment, call the healthcare provider and tell them that you have, or are being evaluated for, COVID-19 infection. Ask your healthcare provider to call the local or state health department.  Wear Tyler Jimenez facemask You should wear Tyler Jimenez facemask that covers your nose and mouth when you are in the same room with other people and when you visit Tyler Jimenez healthcare provider. People who live with or visit you should also wear Tyler Jimenez facemask while they are in the same room with you.  Separate yourself from other people  in your home As much as possible, you should stay in Tyler Jimenez different room from other people in your home. Also, you should use Tyler Jimenez separate bathroom, if available.  Avoid sharing household items You should not share dishes, drinking glasses, cups, eating utensils, towels, bedding, or other items with other people in your home. After using these items, you should wash them thoroughly with soap and water.  Cover your coughs and sneezes Cover your mouth and nose with Tyler Jimenez tissue when you cough or sneeze, or you can cough or sneeze into your sleeve. Throw used tissues in Tyler Jimenez lined trash can, and immediately wash your hands with soap and water for at least 20 seconds or use an alcohol-based hand rub.  Wash your Tyler Jimenez your hands often and thoroughly with soap and water for at least 20 seconds. You can use an alcohol-based hand sanitizer if soap and water are not available and if your hands are not visibly dirty. Avoid touching your eyes, nose, and mouth with unwashed hands.   Prevention Steps for Caregivers and Household Members of Individuals Confirmed to have, or Being Evaluated for, COVID-19 Infection Being Cared for in the Home  If you live with, or provide care at home for, Tyler Jimenez person confirmed to have, or being evaluated for, COVID-19 infection please follow these guidelines to prevent infection:  Follow healthcare providers instructions Make sure that you understand and can help the patient follow any healthcare provider instructions for all care.  Provide for the patients basic needs You should help the patient with basic needs in the home and provide support for getting groceries, prescriptions, and other  personal needs.  Monitor the patients symptoms If they are getting sicker, call his or her medical provider and tell them that the patient has, or is being evaluated for, COVID-19 infection. This will help the healthcare providers office take steps to keep other people from getting  infected. Ask the healthcare provider to call the local or state health department.  Limit the number of people who have contact with the patient  If possible, have only one caregiver for the patient.  Other household members should stay in another home or place of residence. If this is not possible, they should stay  in another room, or be separated from the patient as much as possible. Use Tyler Jimenez separate bathroom, if available.  Restrict visitors who do not have an essential need to be in the home.  Keep older adults, very young children, and other sick people away from the patient Keep older adults, very young children, and those who have compromised immune systems or chronic health conditions away from the patient. This includes people with chronic heart, lung, or kidney conditions, diabetes, and cancer.  Ensure good ventilation Make sure that shared spaces in the home have good air flow, such as from an air conditioner or an opened window, weather permitting.  Wash your hands often  Wash your hands often and thoroughly with soap and water for at least 20 seconds. You can use an alcohol based hand sanitizer if soap and water are not available and if your hands are not visibly dirty.  Avoid touching your eyes, nose, and mouth with unwashed hands.  Use disposable paper towels to dry your hands. If not available, use dedicated cloth towels and replace them when they become wet.  Wear Tyler Jimenez facemask and gloves  Wear Tyler Jimenez disposable facemask at all times in the room and gloves when you touch or have contact with the patients blood, body fluids, and/or secretions or excretions, such as sweat, saliva, sputum, nasal mucus, vomit, urine, or feces.  Ensure the mask fits over your nose and mouth tightly, and do not touch it during use.  Throw out disposable facemasks and gloves after using them. Do not reuse.  Wash your hands immediately after removing your facemask and gloves.  If your personal  clothing becomes contaminated, carefully remove clothing and launder. Wash your hands after handling contaminated clothing.  Place all used disposable facemasks, gloves, and other waste in Jennfier Abdulla lined container before disposing them with other household waste.  Remove gloves and wash your hands immediately after handling these items.  Do not share dishes, glasses, or other household items with the patient  Avoid sharing household items. You should not share dishes, drinking glasses, cups, eating utensils, towels, bedding, or other items with Violett Hobbs patient who is confirmed to have, or being evaluated for, COVID-19 infection.  After the person uses these items, you should wash them thoroughly with soap and water.  Wash laundry thoroughly  Immediately remove and wash clothes or bedding that have blood, body fluids, and/or secretions or excretions, such as sweat, saliva, sputum, nasal mucus, vomit, urine, or feces, on them.  Wear gloves when handling laundry from the patient.  Read and follow directions on labels of laundry or clothing items and detergent. In general, wash and dry with the warmest temperatures recommended on the label.  Clean all areas the individual has used often  Clean all touchable surfaces, such as counters, tabletops, doorknobs, bathroom fixtures, toilets, phones, keyboards, tablets, and bedside tables, every day. Also, clean  any surfaces that may have blood, body fluids, and/or secretions or excretions on them.  Wear gloves when cleaning surfaces the patient has come in contact with.  Use Lillyonna Armstead diluted bleach solution (e.g., dilute bleach with 1 part bleach and 10 parts water) or Marvel Sapp household disinfectant with Kleo Dungee label that says EPA-registered for coronaviruses. To make Arlan Birks bleach solution at home, add 1 tablespoon of bleach to 1 quart (4 cups) of water. For Yancy Knoble larger supply, add  cup of bleach to 1 gallon (16 cups) of water.  Read labels of cleaning products and follow  recommendations provided on product labels. Labels contain instructions for safe and effective use of the cleaning product including precautions you should take when applying the product, such as wearing gloves or eye protection and making sure you have good ventilation during use of the product.  Remove gloves and wash hands immediately after cleaning.  Monitor yourself for signs and symptoms of illness Caregivers and household members are considered close contacts, should monitor their health, and will be asked to limit movement outside of the home to the extent possible. Follow the monitoring steps for close contacts listed on the symptom monitoring form.   ? If you have additional questions, contact your local health department or call the epidemiologist on call at [phone] (available 24/7). ? This guidance is subject to change. For the most up-to-date guidance from [facility], please refer to their website: [employer]Met[employer].hu  Information on my medicine - ELIQUIS (apixaban)  This medication education was reviewed with me or my healthcare representative as part of my discharge preparation.    Why was Eliquis prescribed for you? Eliquis was prescribed to treat blood clots that may have been found in the veins of your legs (deep vein thrombosis) or in your lungs (pulmonary embolism) and to reduce the risk of them occurring again.  What do You need to know about Eliquis ? The starting dose is 10 mg (two 5 mg tablets) taken TWICE daily for the FIRST SEVEN (7) DAYS, then on 04/06/2019  the dose is reduced to ONE 5 mg tablet taken TWICE daily.  Eliquis may be taken with or without food.   Try to take the dose about the same time in the morning and in the evening. If you have difficulty swallowing the tablet whole please discuss with your pharmacist how to take the medication safely.  Take Eliquis exactly as prescribed and DO NOT stop  taking Eliquis without talking to the doctor who prescribed the medication.  Stopping may increase your risk of developing Cherilyn Sautter new blood clot.  Refill your prescription before you run out.  After discharge, you should have regular check-up appointments with your healthcare provider that is prescribing your Eliquis.    What do you do if you miss Ransome Helwig dose? If Kenlee Maler dose of ELIQUIS is not taken at the scheduled time, take it as soon as possible on the same day and twice-daily administration should be resumed. The dose should not be doubled to make up for Deray Dawes missed dose.  Important Safety Information [employer] possible side effect of Eliquis is bleeding. You should call your healthcare provider right away if you experience any of the following: ? Bleeding from an injury or your nose that does not stop. ? Unusual colored urine (red or dark brown) or unusual colored stools (red or black). ? Unusual bruising for unknown reasons. ? Xochitl Egle serious fall or if you hit your head (even if there is no bleeding).  Some medicines  may interact with Eliquis and might increase your risk of bleeding or clotting while on Eliquis. To help avoid this, consult your healthcare provider or pharmacist prior to using any new prescription or non-prescription medications, including herbals, vitamins, non-steroidal anti-inflammatory drugs (NSAIDs) and supplements.  This website has more information on Eliquis (apixaban): http://www.eliquis.com/eliquis/home

## 2019-04-01 NOTE — Discharge Summary (Addendum)
Physician Discharge Summary  Tyler Jimenez ZOX:[id number] DOB: 1931-09-14 DOA: 03/25/2019  PCP: Lyndon Code, MD  Admit date: 03/25/2019 Discharge date: 04/01/2019  Time spent: 40 minutes  Recommendations for Outpatient Follow-up:  1. Follow outpatient CBC/CMP 2. Ensure patient is taking eliquis (he's refused nighttime dose on 2 occasions here) - will need 10 mg twice daily for 7 more days, then start 5 mg twice daily.  Should continue eliquis uninterrupted for 3-6 months.  After this, will need to discuss with PCP regarding long term duration of therapy.  He did have provoking infection with COVID 19, but also had significant burden of thrombus.  Would benefit from continued discussion outpatient after 3-6 months. 3. Of note, if continued issues with compliance with eliquis, consider transition to lovenox or xarelto 4. Modified diet per SLP - please continue to have SLP follow at [facility] - pureed diet, thin liquids. 5. Continue quarantine for 21 day.  His quarantine will end on 12/8 as long as he has 24 hours without symptoms without using medications.  Per CDC guidelines.  Please call health department or PCP with questions. 6. Continue PT/OT/SLP at [facility] 7. Continue to encourage PO intake/hydration (was dehydrated on presentation) 8. Follow blood pressure - on hold in house, consider resumption of amlodipine outpatient if BP allows   Discharge Diagnoses:  Active Problems:   Dementia with behavioral disturbance (HCC)   Essential hypertension   Mixed hyperlipidemia   Uncontrolled type 2 diabetes mellitus with hyperglycemia (HCC)   DKA, type 2 (HCC)   AKI (acute kidney injury) (HCC)   Right lower lobe pulmonary infiltrate   COVID-19   Healthcare-associated pneumonia   COVID-19 virus infection   Acute pulmonary embolism with acute cor pulmonale (HCC)   Acute deep vein thrombosis (DVT) of lower extremity (HCC)   Discharge Condition: stable  Diet recommendation:  pureed  Filed Weights   03/25/19 1700  Weight: 79.4 kg    History of present illness:  Tyler Harewoodis a 85 y.o.malewith medical history significant ofdiabetes mellitus type 2 hypertension dementia, nursing home resident, was diagnosed with Covid positive at nursing home. Was started on Decadron. Patient has history of diabetes mellitus type 2 is on insulin. His blood sugar went up to 780. With anion gap 16 CO2 21. Same time patient has acute kidney injury secondary to dehydration. His acidosis is a combination of mild DKA and acute kidney injury. Chest x-ray showed right lower lobe infiltrate Patient was started on Endo tool protocol, given Maxipime for his pneumonia, repeat Covid test. Emergency room physician discussed the case with the family no remdesivir.  He was admitted for COVID 19 pneumonia and started on dexamethasone.  He was noted to have a significantly elevated d dimer and was found to have a submassive PE with extensive RLE DVT.  He was treated with heparin and transitioned to eliquis.  Hospitalization was c/b delirium requiring a sitter and aspiration event necessitating a change in diet.  He's currently stable and doing well.  Stable for d/c on 11/24.     See below for additional details  Hospital Course:  COVID 19 Pneumonia  Acute Hypoxic Respiratory Failure  Submassive PE with extensive Right Lower Extremity DVT: Continue steroids (6 days dexamethasone) - pt continues to be comfortable on RA Pt daughter does not want remdesivir - she's concerned regarding side effects - had long conversation with her regarding this.  Will hold off on remdesivir at this time. CT chest with large volume bilateral  PE - R heart strain.  Peripheral ground glass opacities. Daily labs - notable for improved d dimer, stable ferritin, rising CRP - stable Procalcitonin <0.1, low suspicion for bacterial pneumonia - will d/c cefepime and follow Continue heparin gtt per pharmacy (plan  for at least 3 days of IV heparin with submassive PE and extensive RLE DVT 11/18 - present).  Continue eliquis.  He's missed some nighttime doses due to refusal (will start starter pack from day 1, discussed with pharmacy).  Discussed this with daughter.  Need to follow closely at [facility].  If he continues to refuse meds, need to consider xarelto vs lovenox. Echo demonstrates mildly reduced RV systolic function and concern for mcconnell's sign  LE [place] also shows an extensive right lower extremity DVT extending to right femoral vein Given hemodynamic stability and stable O2 needs, no indication for lytics at this point (he'd be high risk given his age)  COVID-19 Labs  Recent Labs    03/30/19 0405 03/31/19 1236 04/01/19 0555  DDIMER 7.93* 4.89* 5.28*  FERRITIN 568* 538* 444*  CRP 8.3* 9.6* 8.4*    Lab Results  Component Value Date   SARSCOV2NAA POSITIVE (A) 03/25/2019   SARSCOV2NAA NEGATIVE 02/04/2019   Dementia  Delirium: delirium precautions, follow - he's impulsive - sitter ordered.  Ativan prn agitation.  QTc prolonged, precludes antipsychotics.   11/22 less interactive - normal TSH, ammonia, folate, elevated B12 - VBG ordered, but not collected 11/23 appears closer to baseline Pt calm today.  Still some residual confusion/delirium, but overall improved.  This hopefully will continue to improve at [facility].  Concern for Aspiration: SLP recommending dysphagia 1 diet, thin liquids.  Precautions (small bites/sips, slow rate of intake, feed only when awake/alert, sit upright 90 degrees).  CXR with patchy airspace bilaterally, slightly increased in R mid lung - follow, afebrile (but temp noted to 99.9 x 1)  Mild DKA  T2DM: Resume home meds at discharge  Acute Kidney Injury  Hypernatremia: Baseline creatinine appears about 1.37.  1.85 at peak.  Improving to 1.16 today.   Hypernatremia improved Encourage PO intake and free water  Lactic Acidosis: improved, but not  resolved.  Pt appears stable.  Will stop following at this time.    Hypertension:  Amlodipine on hold.  BP reasonable, follow.   Thrombocytopenia: Possibly related to viral infection.  Continue to monitor.  Procedures: LE [place] Summary: Right: Findings consistent with acute deep vein thrombosis involving the right femoral vein, right popliteal vein, right posterior tibial veins, and right peroneal veins. No cystic structure found in the popliteal fossa. Left: Findings consistent with acute deep vein thrombosis involving the left popliteal vein. No cystic structure found in the popliteal fossa.   Echo IMPRESSIONS    1. Left ventricular ejection fraction, by visual estimation, is 65 to 70%. The left ventricle has hyperdynamic function. There is moderately increased left ventricular hypertrophy.  2. Left ventricular diastolic parameters are consistent with Grade I diastolic dysfunction (impaired relaxation).  3. The left ventricle has no regional wall motion abnormalities.  4. The right ventricular size is normal. No increase in right ventricular wall thickness. The right ventricle is difficult to evaluate due to patient motion. There is a suspicion for McConnell's sign (global hypokinesis with preserved apical  contractility, consistent with pulmonary embolism), but overall RV systolic function appears only mildly reduced.  5. Left atrial size was normal.  6. Right atrial size was normal.  7. Mild to moderate mitral annular calcification.  8. The mitral valve is normal in structure. No evidence of mitral valve regurgitation.  9. The tricuspid valve is normal in structure. Tricuspid valve regurgitation is not demonstrated. 10. The aortic valve is tricuspid. Aortic valve regurgitation is not visualized. 11. There is Moderate calcification of the aortic valve. 12. There is Moderate thickening of the aortic valve. 13. The pulmonic valve was grossly normal. Pulmonic valve regurgitation is not  visualized. 14. TR signal is inadequate for assessing pulmonary artery systolic pressure. 15. The inferior vena cava is normal in size with greater than 50% respiratory variability, suggesting right atrial pressure of 3 mmHg.  Consultations:  none  Discharge Exam: Vitals:   04/01/19 1100 04/01/19 1611  BP: 135/71 (!) 106/43  Pulse:    Resp: (!) 24 16  Temp: 98 F (36.7 C) 98.8 F (37.1 C)  SpO2: 100% 99%   No complaints Pleasantly confused Discussed d/c plan with daughter over phone  General: No acute distress. Cardiovascular: RRR Lungs: unlabored Abdomen: Soft, nontender, nondistended  Neurological: Alert and disoriented. Moves all extremities 4. Cranial nerves II through XII grossly intact. Skin: Warm and dry. No rashes or lesions. Extremities: No clubbing or cyanosis. No edema.    Discharge Instructions   Discharge Instructions    Call MD for:  difficulty breathing, headache or visual disturbances   Complete by: As directed    Call MD for:  extreme fatigue   Complete by: As directed    Call MD for:  hives   Complete by: As directed    Call MD for:  persistant dizziness or light-headedness   Complete by: As directed    Call MD for:  persistant nausea and vomiting   Complete by: As directed    Call MD for:  redness, tenderness, or signs of infection (pain, swelling, redness, odor or green/yellow discharge around incision site)   Complete by: As directed    Call MD for:  severe uncontrolled pain   Complete by: As directed    Call MD for:  temperature >100.4   Complete by: As directed    DIET - DYS 1   Complete by: As directed    Fluid consistency: Thin   Discharge diet:   Complete by: As directed    Diet Recommendation  Thin liquid;Dysphagia 1 (Puree)   Liquid Administration via: Cup;Straw Medication Administration: Crushed with puree Supervision: Full supervision/cueing for compensatory strategies Compensations: Minimize environmental  distractions;Slow rate;Small sips/bites Postural Changes: Seated upright at 90 degrees   Discharge instructions   Complete by: As directed    You were seen for COVID 19 infection.   You also had significant blood clots in your lungs and legs.  We've started you on eliquis.  You'll take 10 mg twice daily for the next 7 days and then you'll take 5 mg twice daily.  You should be on uninterrupted anticoagulation for 3-6 months.  After this, you can have a discussion about how long to continue this with your PCP.  This was likely provoked by your COVID 19 infection, so we may know more in a few months about how to approach your long term risk of recurrent DVT.  You were treated with steroids for COVID 19 infection.  You should quarantine for 21 days, until December 8th.  You need at least 24 hours without symptoms to discontinue your quarantine.  Please discuss with the health department or your PCP if you have questions regarding your quarantine.  You were started on a modified diet.  Continue a pureed (dysphagia 1 diet).  Continue to have speech therapy as an outpatient.  Return for new, recurrent, or worsening symptoms.  Please ask your PCP to request records from this hospitalization so they know what was done and what the next steps will be.   Increase activity slowly   Complete by: As directed      Allergies as of 04/01/2019   No Known Allergies     Medication List    STOP taking these medications   amLODipine 10 MG tablet Commonly known as: NORVASC   dexamethasone 6 MG tablet Commonly known as: DECADRON     TAKE these medications   acetaminophen 500 MG tablet Commonly known as: TYLENOL Take 500 mg by mouth every 6 (six) hours as needed for mild pain.   alum & mag hydroxide-simeth 200-200-20 MG/5ML suspension Commonly known as: MAALOX/MYLANTA Take 30 mLs by mouth every 6 (six) hours as needed for indigestion or heartburn.   apixaban 5 MG Tabs tablet Commonly known as:  Eliquis Take 2 tablets (10mg ) twice daily for 7 days, then 1 tablet (5mg ) twice daily   ascorbic acid 500 MG tablet Commonly known as: VITAMIN C Take 1 tablet (500 mg total) by mouth daily. Start taking on: April 02, 2019   FREESTYLE LITE test strip Generic drug: glucose blood 1 each by Other route 2 (two) times daily. diag e11.65   glimepiride 2 MG tablet Commonly known as: AMARYL Take 1 tablet (2 mg total) by mouth daily.   guaifenesin 100 MG/5ML syrup Commonly known as: ROBITUSSIN Take 200 mg by mouth every 6 (six) hours as needed for cough.   Kombiglyze XR 09-998 MG Tb24 Generic drug: Saxagliptin-Metformin Take 1 tablet by mouth daily with supper.   loperamide 2 MG capsule Commonly known as: IMODIUM Take 2-4 mg by mouth as needed for diarrhea or loose stools.   LORazepam 0.5 MG tablet Commonly known as: ATIVAN Take 0.5 mg by mouth every 8 (eight) hours as needed (agitation).   magnesium hydroxide 400 MG/5ML suspension Commonly known as: MILK OF MAGNESIA Take 30 mLs by mouth daily as needed for mild constipation.   mirtazapine 7.5 MG tablet Commonly known as: REMERON Take 1 tablet (7.5 mg total) by mouth at bedtime as needed. What changed:   when to take this  reasons to take this   Multi-Vitamins Tabs Take 1 tablet by mouth daily. Notes to patient: 04/02/2019 AM   pioglitazone 45 MG tablet Commonly known as: ACTOS Take 1 tablet (45 mg total) by mouth daily.   Vitamin D3 25 MCG tablet Commonly known as: Vitamin D Take 1 tablet (1,000 Units total) by mouth daily. Start taking on: April 02, 2019   zinc sulfate 220 (50 Zn) MG capsule Take 1 capsule (220 mg total) by mouth daily. Start taking on: April 02, 2019      No Known Allergies    The results of significant diagnostics from this hospitalization (including imaging, microbiology, ancillary and laboratory) are listed below for reference.    Significant Diagnostic Studies: Ct Angio  Chest Pe W Or Wo Contrast  Addendum Date: 03/26/2019   ADDENDUM REPORT: 03/26/2019 20:22 ADDENDUM: Critical Value/emergent results were called by telephone at the time of interpretation on 03/26/2019 at 8:22 pm to providerDR KIRBY, who verbally acknowledged these results. Electronically Signed   By: Charlett Nose M.D.   On: 03/26/2019 20:22   Result Date: 03/26/2019 CLINICAL DATA:  Elevated D-dimer. EXAM: CT ANGIOGRAPHY CHEST WITH CONTRAST TECHNIQUE: Multidetector  CT imaging of the chest was performed using the standard protocol during bolus administration of intravenous contrast. Multiplanar CT image reconstructions and MIPs were obtained to evaluate the vascular anatomy. CONTRAST:  42mL OMNIPAQUE IOHEXOL 350 MG/ML SOLN COMPARISON:  None. FINDINGS: Cardiovascular: There are bilateral pulmonary emboli noted with large volume, most pronounced in the lower lobes bilaterally, but also seen in the upper lobes. Evidence of right heart strain with RV: LV ratio of 1.36. Coronary artery and aortic calcifications. No evidence of aortic aneurysm. Mediastinum/Nodes: No mediastinal, hilar, or axillary adenopathy. Trachea and esophagus are unremarkable. Thyroid unremarkable. Lungs/Pleura: Peripheral ground-glass airspace opacities throughout both lungs. While this may be related to the bilateral pulmonary emboli, infection cannot be excluded, particularly atypical/viral infection. No effusions. Upper Abdomen: Imaging into the upper abdomen shows no acute findings. Musculoskeletal: Chest wall soft tissues are unremarkable. No acute bony abnormality. Review of the MIP images confirms the above findings. IMPRESSION: Large volume bilateral pulmonary emboli. CT evidence of right heart strain (RV/LV Ratio = 1.36) consistent with at least submassive (intermediate risk) PE. The presence of right heart strain has been associated with an increased risk of morbidity and mortality. Please activate Code PE by paging [phone].  Peripheral ground-glass airspace opacities within the lungs bilaterally. While this may be related to changes from bilateral pulmonary emboli, cannot exclude infection, particularly atypical/viral infection. Recommend correlation with COVID status. Electronically Signed: By: Charlett Nose M.D. On: 03/26/2019 19:58   Dg Chest Port 1 View  Result Date: 03/31/2019 CLINICAL DATA:  COVID-19 positive EXAM: PORTABLE CHEST 1 VIEW COMPARISON:  March 30, 2019 and March 25, 2019 FINDINGS: Patchy airspace opacity is noted in the left mid lower lung zones. More subtle ill-defined opacity noted in the right mid lung and right base regions. Subtle increased opacity in the right mid lung compared to 1 day prior. Heart is slightly enlarged with pulmonary vascularity normal. No adenopathy. Thoracic aorta is mildly prominent but stable. No adenopathy. No bone lesions. IMPRESSION: Patchy airspace opacity bilaterally, slightly increased in the right mid lung and essentially stable elsewhere. Stable cardiac prominence. Thoracic aortic prominence suggest chronic hypertensive change. No adenopathy. Electronically Signed   By: Bretta Bang III M.D.   On: 03/31/2019 08:26   Dg Chest Port 1 View  Result Date: 03/30/2019 CLINICAL DATA:  Aspiration. EXAM: PORTABLE CHEST 1 VIEW COMPARISON:  03/25/2019 FINDINGS: 1430 hours. The cardio pericardial silhouette is enlarged. Interstitial markings are diffusely coarsened with chronic features. Basilar subtle airspace opacity in the right lower lung is stable with interval progression of airspace opacity at the left base. No substantial pleural effusion. The visualized bony structures of the thorax are intact. Telemetry leads overlie the chest. IMPRESSION: 1. Bibasilar airspace disease, progressive on the left. Electronically Signed   By: Kennith Center M.D.   On: 03/30/2019 14:56   Dg Chest Port 1 View  Result Date: 03/25/2019 CLINICAL DATA:  Cough, shortness of breath EXAM:  PORTABLE CHEST 1 VIEW COMPARISON:  None. FINDINGS: The heart size and mediastinal contours are within normal limits. Streaky right basilar opacity. No pleural effusion or pneumothorax. The visualized skeletal structures are unremarkable. IMPRESSION: Streaky right basilar opacity which may reflect atelectasis and/or developing infection. Electronically Signed   By: Duanne Guess M.D.   On: 03/25/2019 16:56   Vas [place] Lower Extremity Venous (dvt)  Result Date: 03/27/2019  Lower Venous Study Indications: Ddimer.  Comparison Study: no prior Performing Technologist: Blanch Media RVS  Examination Guidelines: A complete evaluation includes B-mode  imaging, spectral Doppler, color Doppler, and power Doppler as needed of all accessible portions of each vessel. Bilateral testing is considered an integral part of a complete examination. Limited examinations for reoccurring indications may be performed as noted.  +---------+---------------+---------+-----------+----------+--------------+ RIGHT    CompressibilityPhasicitySpontaneityPropertiesThrombus Aging +---------+---------------+---------+-----------+----------+--------------+ CFV      Full           Yes      Yes                                 +---------+---------------+---------+-----------+----------+--------------+ SFJ      Full                                                        +---------+---------------+---------+-----------+----------+--------------+ FV Prox  Partial                                      Acute          +---------+---------------+---------+-----------+----------+--------------+ FV Mid   None                                         Acute          +---------+---------------+---------+-----------+----------+--------------+ FV DistalNone                                         Acute          +---------+---------------+---------+-----------+----------+--------------+ PFV      Full                                                         +---------+---------------+---------+-----------+----------+--------------+ POP      None           No       No                   Acute          +---------+---------------+---------+-----------+----------+--------------+ PTV      None                                         Acute          +---------+---------------+---------+-----------+----------+--------------+ PERO     None                                         Acute          +---------+---------------+---------+-----------+----------+--------------+   +---------+---------------+---------+-----------+----------+--------------+ LEFT     CompressibilityPhasicitySpontaneityPropertiesThrombus Aging +---------+---------------+---------+-----------+----------+--------------+ CFV      Full           Yes      Yes                                 +---------+---------------+---------+-----------+----------+--------------+  SFJ      Full                                                        +---------+---------------+---------+-----------+----------+--------------+ FV Prox  Full                                                        +---------+---------------+---------+-----------+----------+--------------+ FV Mid   Full                                                        +---------+---------------+---------+-----------+----------+--------------+ FV DistalFull                                                        +---------+---------------+---------+-----------+----------+--------------+ PFV      Full                                                        +---------+---------------+---------+-----------+----------+--------------+ POP      None           No       No                   Acute          +---------+---------------+---------+-----------+----------+--------------+ PTV      Full                                                         +---------+---------------+---------+-----------+----------+--------------+ PERO     Full                                                        +---------+---------------+---------+-----------+----------+--------------+     Summary: Right: Findings consistent with acute deep vein thrombosis involving the right femoral vein, right popliteal vein, right posterior tibial veins, and right peroneal veins. No cystic structure found in the popliteal fossa. Left: Findings consistent with acute deep vein thrombosis involving the left popliteal vein. No cystic structure found in the popliteal fossa.  *See table(s) above for measurements and observations. Electronically signed by Sherald Hess MD on 03/27/2019 at 7:14:06 PM.    Final     Microbiology: Recent Results (from the past 240 hour(s))  SARS CORONAVIRUS 2 (TAT 6-24 HRS) Nasopharyngeal Nasopharyngeal Swab     Status: Abnormal   Collection Time: 03/25/19  5:27 PM   Specimen: Nasopharyngeal Swab  Result Value Ref Range Status   SARS Coronavirus 2 POSITIVE (A) NEGATIVE Final    Comment: RESULT CALLED TO, READ BACK BY AND VERIFIED WITH: RN A MCKEOWN @0826  03/26/19 BY S GEZAHEGN (NOTE) SARS-CoV-2 target nucleic acids are DETECTED. The SARS-CoV-2 RNA is generally detectable in upper and lower respiratory specimens during the acute phase of infection. Positive results are indicative of active infection with SARS-CoV-2. Clinical  correlation with patient history and other diagnostic information is necessary to determine patient infection status. Positive results do  not rule out bacterial infection or co-infection with other viruses. The expected result is Negative. Fact Sheet for Patients: SugarRoll.be Fact Sheet for Healthcare Providers: https://www.woods-mathews.com/ This test is not yet approved or cleared by the [place] FDA and  has been authorized for detection and/or diagnosis of  SARS-CoV-2 by FDA under an Emergency Use Authorization (EUA). This EUA will remain  in effect (meaning this test can be used)  for the duration of the COVID-19 declaration under Section 564(b)(1) of the Act, 21 U.S.C. section 360bbb-3(b)(1), unless the authorization is terminated or revoked sooner. Performed at [facility], [place] [address], [place], [place] [address]   Blood culture (routine x 2)     Status: None   Collection Time: 03/25/19  7:09 PM   Specimen: BLOOD  Result Value Ref Range Status   Specimen Description BLOOD RIGHT ANTECUBITAL  Final   Special Requests   Final    BOTTLES DRAWN AEROBIC AND ANAEROBIC Blood Culture results may not be optimal due to an inadequate volume of blood received in culture bottles   Culture   Final    NO GROWTH 5 DAYS Performed at [facility], [place] [address], [place], [place] [address]    Report Status 03/30/2019 FINAL  Final  Blood culture (routine x 2)     Status: None   Collection Time: 03/25/19  7:14 PM   Specimen: BLOOD  Result Value Ref Range Status   Specimen Description BLOOD LEFT ANTECUBITAL  Final   Special Requests   Final    BOTTLES DRAWN AEROBIC AND ANAEROBIC Blood Culture adequate volume   Culture   Final    NO GROWTH 5 DAYS Performed at [facility], [place] [address], [place], [place] [address]    Report Status 03/30/2019 FINAL  Final  MRSA PCR Screening     Status: None   Collection Time: 03/28/19 12:58 AM   Specimen: Nasopharyngeal  Result Value Ref Range Status   MRSA by PCR NEGATIVE NEGATIVE Final    Comment:        The GeneXpert MRSA Assay (FDA approved for NASAL specimens only), is one component of a comprehensive MRSA colonization surveillance program. It is not intended to diagnose MRSA infection nor to guide or monitor treatment for MRSA infections. Performed at [facility], [place] [address], [place], [place] [address]      Labs: Basic Metabolic Panel: Recent Labs  Lab  03/26/19 0418  03/27/19 0500  03/28/19 0117 03/28/19 1514 03/29/19 0403 03/30/19 0405 03/31/19 1236 04/01/19 0555  NA 157*   < > 133*   < > 150* 149* 148* 148* 141 142  K 4.9   < > 4.5   < > 3.8 3.9 3.5 4.0 3.6 3.4*  CL 119*   < > 99   < > 118* 116* 114* 116* 112* 111  CO2 24   < > 23   < >  GLUCOSE 163*   < > 724*   < > 113* 205* 198* 159* 161* 220*  BUN 41*   < > 24*   < > 21 24* 19 14 7* 10  CREATININE 1.43*   < > 1.22   < > 1.15 1.18 1.14 1.03 1.01 1.16  CALCIUM 9.7   < > 8.4*   < > 8.9 8.8* 8.7* 8.7* 8.4* 8.4*  MG 2.6*  --  1.9  --  2.1  --  2.1 2.0 1.9 1.9  PHOS 4.3  --  3.7  --  3.7  --  3.8 3.1  --   --    < > = values in this interval not displayed.   Liver Function Tests: Recent Labs  Lab 03/28/19 0117 03/29/19 0403 03/30/19 0405 03/31/19 1236 04/01/19 0555  AST ALT ALKPHOS 41 42 42 41 42  BILITOT 0.8 0.7 0.6 0.7 0.7  PROT 5.9* 6.3* 5.9* 5.9* 5.3*  ALBUMIN 2.5* 2.4* 2.3* 2.2* 2.0*   No results for input(s): LIPASE, AMYLASE in the last 168 hours. Recent Labs  Lab 03/30/19 1226  AMMONIA 16   CBC: Recent Labs  Lab 03/28/19 0117 03/29/19 0403 03/30/19 0405 03/31/19 1236 04/01/19 0555  WBC 13.5* 9.7 9.0 8.0 7.9  NEUTROABS 11.6* 7.2 6.4 5.9 5.9  HGB 12.8* 13.2 12.0* 11.9* 11.3*  HCT 40.1 42.3 39.3 37.9* 35.8*  MCV 86.2 87.6 88.1 87.9 88.0  PLT 102* 115* 124* 146* 160   Cardiac Enzymes: No results for input(s): CKTOTAL, CKMB, CKMBINDEX, TROPONINI in the last 168 hours. BNP: BNP (last 3 results) No results for input(s): BNP in the last 8760 hours.  ProBNP (last 3 results) No results for input(s): PROBNP in the last 8760 hours.  CBG: Recent Labs  Lab 03/31/19 1658 03/31/19 2119 04/01/19 0830 04/01/19 1121 04/01/19 1609  GLUCAP 131* 212* 186* 243* 193*       Signed:  Lacretia Nicks MD.  [facility] 04/01/2019, 4:37 PM

## 2019-04-01 NOTE — NC FL2 (Addendum)
[place] LEVEL OF CARE SCREENING TOOL     IDENTIFICATION  Patient Name: Tyler Jimenez Birthdate: 10/04/1931 Sex: male Admission Date (Current Location): 03/25/2019  [facility] and [place] Number:  Herbalist and Address:  [facility], [place] [address] [place], [place] [address]      Provider Number: [id number]  Attending Physician Name and Address:  Elodia Florence., *  Relative Name and Phone Number:  Governor Specking, Daughter, [phone]    Current Level of Care: Hospital Recommended Level of Care: Memory Care Prior Approval Number:    Date Approved/Denied:   PASRR Number:    Discharge Plan: Other (Comment)(Memory Care)    Current Diagnoses: Patient Active Problem List   Diagnosis Date Noted  . Acute deep vein thrombosis (DVT) of lower extremity ([place])   . Acute pulmonary embolism with acute cor pulmonale (HCC)   . COVID-19 virus infection 03/26/2019  . DKA, type 2 ([place]) 03/25/2019  . AKI (acute kidney injury) ([place]) 03/25/2019  . Right lower lobe pulmonary infiltrate 03/25/2019  . COVID-19 03/25/2019  . Healthcare-associated pneumonia 03/25/2019  . Urinary tract infection with hematuria 01/26/2019  . Loss of appetite 01/26/2019  . Current moderate episode of major depressive disorder without prior episode ([place]) 01/26/2019  . Fall 09/21/2018  . Encounter for health maintenance examination with abnormal findings 04/29/2018  . Dysuria 04/29/2018  . Uncontrolled type 2 diabetes mellitus with hyperglycemia ([place]) 10/17/2017  . Tinea pedis of both feet 10/17/2017  . Dementia with behavioral disturbance ([place]) 04/25/2017  . DM2 (diabetes mellitus, type 2) ([place]) 04/25/2017  . Essential hypertension 04/25/2017  . Cardiac murmur 04/25/2017  . Mixed hyperlipidemia 04/25/2017    Orientation RESPIRATION BLADDER Height & Weight     Self  O2(98, [place], 4L) Incontinent Weight: 175 lb (79.4 kg) Height:  '5\' 6"'$  (167.6  cm)  BEHAVIORAL SYMPTOMS/MOOD NEUROLOGICAL BOWEL NUTRITION STATUS      Continent Diet(puree solids, thin liquids)  AMBULATORY STATUS COMMUNICATION OF NEEDS Skin   Limited Assist Verbally Normal                       Personal Care Assistance Level of Assistance  Bathing, Feeding, Dressing Bathing Assistance: Limited assistance Feeding assistance: Independent Dressing Assistance: Limited assistance Total Care Assistance: Limited assistance   Functional Limitations Info  Sight, Hearing, Speech Sight Info: Adequate Hearing Info: Adequate Speech Info: Adequate    SPECIAL CARE FACTORS FREQUENCY  PT (By licensed PT), OT (By licensed OT), Speech therapy     PT Frequency: 3x/wk with home health OT Frequency: 3x/wk with home health     Speech Therapy Frequency: 3x/wk with home health      Contractures Contractures Info: Not present    Additional Factors Info  Code Status, Allergies, Isolation Precautions, Insulin Sliding Scale Code Status Info: Full Code Allergies Info: No Known Allergies   Insulin Sliding Scale Info: insulin aspart noolog 0-15 units 3x daily w/meals; insulin aspart novolog 0-5 units daily at bedtime; insulin glargine lantus 7 units every 24 hours Isolation Precautions Info: COVID +     Current Medications (04/01/2019):  This is the current hospital active medication list Current Facility-Administered Medications  Medication Dose Route Frequency Provider Last Rate Last Dose  . apixaban Arne Cleveland) Education Kit for DVT/PE patients   Does not apply Once Harvel Quale, [facility]      . apixaban (ELIQUIS) tablet 10 mg  10 mg Oral BID Florene Glen, A  Clint Lipps., MD   10 mg at 04/01/19 0917   Followed by  . [START ON 04/06/2019] apixaban (ELIQUIS) tablet 5 mg  5 mg Oral BID Elodia Florence., MD      . cholecalciferol (VITAMIN D3) tablet 1,000 Units  1,000 Units Oral Daily Elodia Florence., MD   1,000 Units at 04/01/19 0915  . dextrose 50 % solution 0-50 mL   0-50 mL Intravenous PRN Drenda Freeze, MD      . dextrose 50 % solution 0-50 mL  0-50 mL Intravenous PRN Cristescu, Linard Millers, MD   50 mL at 03/30/19 1646  . feeding supplement (ENSURE ENLIVE) (ENSURE ENLIVE) liquid 237 mL  237 mL Oral BID BM Elodia Florence., MD   237 mL at 04/01/19 0918  . folic acid (FOLVITE) tablet 1 mg  1 mg Oral Daily Cristescu, Mircea G, MD   1 mg at 04/01/19 0915  . free water 200 mL  200 mL Oral TID Elodia Florence., MD      . insulin aspart (novoLOG) injection 0-15 Units  0-15 Units Subcutaneous TID WC Elodia Florence., MD   5 Units at 04/01/19 1159  . insulin aspart (novoLOG) injection 0-5 Units  0-5 Units Subcutaneous QHS Elodia Florence., MD      . insulin glargine (LANTUS) injection 7 Units  7 Units Subcutaneous Q24H Elodia Florence., MD   7 Units at 04/01/19 [id number]  . multivitamin with minerals tablet 1 tablet  1 tablet Oral Daily Elodia Florence., MD   1 tablet at 04/01/19 0915  . sodium chloride flush (NS) 0.9 % injection 10-40 mL  10-40 mL Intracatheter Q12H Elodia Florence., MD   10 mL at 03/31/19 2236  . sodium chloride flush (NS) 0.9 % injection 10-40 mL  10-40 mL Intracatheter PRN Elodia Florence., MD      . sodium chloride flush (NS) 0.9 % injection 3 mL  3 mL Intravenous Q12H Cristescu, Linard Millers, MD   3 mL at 04/01/19 0928  . vitamin C (ASCORBIC ACID) tablet 500 mg  500 mg Oral Daily Cristescu, Mircea G, MD   500 mg at 04/01/19 0915  . zinc sulfate capsule 220 mg  220 mg Oral Daily Cristescu, Linard Millers, MD   220 mg at 04/01/19 0915     Discharge Medications: STOP taking these medications   amLODipine 10 MG tablet Commonly known as: NORVASC   dexamethasone 6 MG tablet Commonly known as: DECADRON     TAKE these medications   acetaminophen 500 MG tablet Commonly known as: TYLENOL Take 500 mg by mouth every 6 (six) hours as needed for mild pain.   alum & mag hydroxide-simeth 200-200-20 MG/5ML  suspension Commonly known as: MAALOX/MYLANTA Take 30 mLs by mouth every 6 (six) hours as needed for indigestion or heartburn.   apixaban 5 MG Tabs tablet Commonly known as: ELIQUIS Take 2 tablets (10 mg total) by mouth 2 (two) times daily for 5 days. And then start prescription for 5 mg twice daily   apixaban 5 MG Tabs tablet Commonly known as: ELIQUIS Take 1 tablet (5 mg total) by mouth 2 (two) times daily. Start after completing prescription for 10 mg twice daily. Start taking on: April 06, 2019   ascorbic acid 500 MG tablet Commonly known as: VITAMIN C Take 1 tablet (500 mg total) by mouth daily. Start taking on: April 02, 2019  FREESTYLE LITE test strip Generic drug: glucose blood 1 each by Other route 2 (two) times daily. diag e11.65   glimepiride 2 MG tablet Commonly known as: AMARYL Take 1 tablet (2 mg total) by mouth daily.   guaifenesin 100 MG/5ML syrup Commonly known as: ROBITUSSIN Take 200 mg by mouth every 6 (six) hours as needed for cough.   Kombiglyze XR 09-998 MG Tb24 Generic drug: Saxagliptin-Metformin Take 1 tablet by mouth daily with supper.   loperamide 2 MG capsule Commonly known as: IMODIUM Take 2-4 mg by mouth as needed for diarrhea or loose stools.   LORazepam 0.5 MG tablet Commonly known as: ATIVAN Take 0.5 mg by mouth every 8 (eight) hours as needed (agitation).   magnesium hydroxide 400 MG/5ML suspension Commonly known as: MILK OF MAGNESIA Take 30 mLs by mouth daily as needed for mild constipation.   mirtazapine 7.5 MG tablet Commonly known as: REMERON Take 1 tablet (7.5 mg total) by mouth at bedtime as needed. What changed:   when to take this  reasons to take this   Multi-Vitamins Tabs Take 1 tablet by mouth daily. Notes to patient: 04/02/2019 AM   pioglitazone 45 MG tablet Commonly known as: ACTOS Take 1 tablet (45 mg total) by mouth daily.   Vitamin D3 25 MCG tablet Commonly known as: Vitamin D Take 1  tablet (1,000 Units total) by mouth daily. Start taking on: April 02, 2019   zinc sulfate 220 (50 Zn) MG capsule Take 1 capsule (220 mg total) by mouth daily. Start taking on: April 02, 2019      Relevant Imaging Results:  Relevant Lab Results:   Additional Information SSN: [id number]  Geralynn Ochs, LCSW

## 2019-04-04 DIAGNOSIS — R278 Other lack of coordination: Secondary | ICD-10-CM | POA: Diagnosis not present

## 2019-04-04 DIAGNOSIS — R293 Abnormal posture: Secondary | ICD-10-CM | POA: Diagnosis not present

## 2019-04-04 DIAGNOSIS — M6281 Muscle weakness (generalized): Secondary | ICD-10-CM | POA: Diagnosis not present

## 2019-04-07 DIAGNOSIS — R293 Abnormal posture: Secondary | ICD-10-CM | POA: Diagnosis not present

## 2019-04-07 DIAGNOSIS — R278 Other lack of coordination: Secondary | ICD-10-CM | POA: Diagnosis not present

## 2019-04-07 DIAGNOSIS — J45901 Unspecified asthma with (acute) exacerbation: Secondary | ICD-10-CM | POA: Diagnosis not present

## 2019-04-07 DIAGNOSIS — E1165 Type 2 diabetes mellitus with hyperglycemia: Secondary | ICD-10-CM | POA: Diagnosis not present

## 2019-04-07 DIAGNOSIS — U071 COVID-19: Secondary | ICD-10-CM | POA: Diagnosis not present

## 2019-04-07 DIAGNOSIS — I82409 Acute embolism and thrombosis of unspecified deep veins of unspecified lower extremity: Secondary | ICD-10-CM | POA: Diagnosis not present

## 2019-04-07 DIAGNOSIS — R1312 Dysphagia, oropharyngeal phase: Secondary | ICD-10-CM | POA: Diagnosis not present

## 2019-04-07 DIAGNOSIS — I10 Essential (primary) hypertension: Secondary | ICD-10-CM | POA: Diagnosis not present

## 2019-04-07 DIAGNOSIS — G301 Alzheimer's disease with late onset: Secondary | ICD-10-CM | POA: Diagnosis not present

## 2019-04-07 DIAGNOSIS — I2699 Other pulmonary embolism without acute cor pulmonale: Secondary | ICD-10-CM | POA: Diagnosis not present

## 2019-04-07 DIAGNOSIS — M6281 Muscle weakness (generalized): Secondary | ICD-10-CM | POA: Diagnosis not present

## 2019-04-10 DIAGNOSIS — M6281 Muscle weakness (generalized): Secondary | ICD-10-CM | POA: Diagnosis not present

## 2019-04-10 DIAGNOSIS — R2681 Unsteadiness on feet: Secondary | ICD-10-CM | POA: Diagnosis not present

## 2019-04-14 DIAGNOSIS — R2681 Unsteadiness on feet: Secondary | ICD-10-CM | POA: Diagnosis not present

## 2019-04-14 DIAGNOSIS — M6281 Muscle weakness (generalized): Secondary | ICD-10-CM | POA: Diagnosis not present

## 2019-04-16 DIAGNOSIS — R2681 Unsteadiness on feet: Secondary | ICD-10-CM | POA: Diagnosis not present

## 2019-04-16 DIAGNOSIS — M6281 Muscle weakness (generalized): Secondary | ICD-10-CM | POA: Diagnosis not present

## 2019-04-18 DIAGNOSIS — R2681 Unsteadiness on feet: Secondary | ICD-10-CM | POA: Diagnosis not present

## 2019-04-18 DIAGNOSIS — M6281 Muscle weakness (generalized): Secondary | ICD-10-CM | POA: Diagnosis not present

## 2019-04-20 DIAGNOSIS — R2681 Unsteadiness on feet: Secondary | ICD-10-CM | POA: Diagnosis not present

## 2019-04-20 DIAGNOSIS — M6281 Muscle weakness (generalized): Secondary | ICD-10-CM | POA: Diagnosis not present

## 2019-04-23 DIAGNOSIS — M6281 Muscle weakness (generalized): Secondary | ICD-10-CM | POA: Diagnosis not present

## 2019-04-23 DIAGNOSIS — R2681 Unsteadiness on feet: Secondary | ICD-10-CM | POA: Diagnosis not present

## 2019-04-25 ENCOUNTER — Encounter: Payer: Self-pay | Admitting: Nurse Practitioner

## 2019-04-25 ENCOUNTER — Other Ambulatory Visit: Payer: Self-pay

## 2019-04-25 ENCOUNTER — Ambulatory Visit (INDEPENDENT_AMBULATORY_CARE_PROVIDER_SITE_OTHER): Payer: Medicare Other | Admitting: Nurse Practitioner

## 2019-04-25 ENCOUNTER — Telehealth: Payer: Self-pay

## 2019-04-25 VITALS — BP 118/78 | HR 75 | Temp 95.9°F | Resp 16 | Ht 69.0 in | Wt 159.0 lb

## 2019-04-25 DIAGNOSIS — I824Z9 Acute embolism and thrombosis of unspecified deep veins of unspecified distal lower extremity: Secondary | ICD-10-CM

## 2019-04-25 DIAGNOSIS — R2681 Unsteadiness on feet: Secondary | ICD-10-CM | POA: Diagnosis not present

## 2019-04-25 DIAGNOSIS — I10 Essential (primary) hypertension: Secondary | ICD-10-CM

## 2019-04-25 DIAGNOSIS — F0391 Unspecified dementia with behavioral disturbance: Secondary | ICD-10-CM

## 2019-04-25 DIAGNOSIS — E1165 Type 2 diabetes mellitus with hyperglycemia: Secondary | ICD-10-CM | POA: Diagnosis not present

## 2019-04-25 DIAGNOSIS — I2609 Other pulmonary embolism with acute cor pulmonale: Secondary | ICD-10-CM | POA: Diagnosis not present

## 2019-04-25 DIAGNOSIS — M6281 Muscle weakness (generalized): Secondary | ICD-10-CM | POA: Diagnosis not present

## 2019-04-25 LAB — POCT GLYCOSYLATED HEMOGLOBIN (HGB A1C): Hemoglobin A1C: 7.2 % — AB (ref 4.0–5.6)

## 2019-04-25 NOTE — Progress Notes (Signed)
[facility] [address] [place], [place] [address]  Internal MEDICINE  Office Visit Note  Patient Name: Tyler Jimenez  [id number]  [id number]  Date of Service: 05/04/2019   Pt is here for recent hospital follow up.    Chief Complaint  Patient presents with  . Medical Management of Chronic Issues    pt  had covid last month   . Diabetes     The patient presents for hospital follow up. Was taken to ER after he tested positive for COVID last month. He had developed pain in his leg and was not oxygenating well. Had developed clots in his leg and lung. He was hospitalized for one week and one day. He is now on eliquis. He is a resident at memory care unit. He had mandatory quarantine period for 21 days after his hospitalization. He has recovered well. He has no shortness of breath. Denies chest pain, chest pressure, headache, or visual disturbance. His blood sugars are doing much better. HgbA1c is 7.2 today. At last check his HgbA1c was 8.9. he has no new concerns or complaints to discuss.   Current Medication: Outpatient Encounter Medications as of 04/25/2019  Medication Sig  . acetaminophen (TYLENOL) 500 MG tablet Take 500 mg by mouth every 6 (six) hours as needed for mild pain.  . [EXPIRED] alum & mag hydroxide-simeth (MAALOX/MYLANTA) 200-200-20 MG/5ML suspension Take 30 mLs by mouth every 6 (six) hours as needed for indigestion or heartburn.  . [EXPIRED] ascorbic acid (VITAMIN C) 500 MG tablet Take 1 tablet (500 mg total) by mouth daily.  Marland Kitchen ELIQUIS 5 MG TABS tablet Take 5 mg by mouth 2 (two) times daily.  Marland Kitchen glimepiride (AMARYL) 2 MG tablet Take 1 tablet (2 mg total) by mouth daily.  Marland Kitchen glucose blood (FREESTYLE LITE) test strip 1 each by Other route 2 (two) times daily. diag e11.65  . KOMBIGLYZE XR 09-998 MG TB24 Take 1 tablet by mouth daily with supper.  . loperamide (IMODIUM) 2 MG capsule Take 2-4 mg by mouth as needed for diarrhea or loose stools.  Marland Kitchen LORazepam  (ATIVAN) 0.5 MG tablet Take 0.5 mg by mouth every 8 (eight) hours as needed (agitation).  . magnesium hydroxide (MILK OF MAGNESIA) 400 MG/5ML suspension Take 30 mLs by mouth daily as needed for mild constipation.  . mirtazapine (REMERON) 7.5 MG tablet Take 1 tablet (7.5 mg total) by mouth at bedtime as needed.  . Multiple Vitamin (MULTI-VITAMINS) TABS Take 1 tablet by mouth daily.   . pioglitazone (ACTOS) 45 MG tablet Take 1 tablet (45 mg total) by mouth daily.  . [EXPIRED] Vitamin D3 (VITAMIN D) 25 MCG tablet Take 1 tablet (1,000 Units total) by mouth daily.  . [EXPIRED] zinc sulfate 220 (50 Zn) MG capsule Take 1 capsule (220 mg total) by mouth daily.  Marland Kitchen amLODipine (NORVASC) 10 MG tablet Take 10 mg by mouth daily.  Marland Kitchen guaifenesin (ROBITUSSIN) 100 MG/5ML syrup Take 200 mg by mouth every 6 (six) hours as needed for cough.  . [DISCONTINUED] Apixaban Starter Pack (ELIQUIS DVT/PE STARTER PACK) 5 MG TBPK Take as directed on package: start with two-5mg  tablets twice daily for 7 days. On day 8, switch to one-5mg  tablet twice daily. (Patient not taking: Reported on 04/25/2019)   No facility-administered encounter medications on file as of 04/25/2019.    Surgical History: Past Surgical History:  Procedure Laterality Date  . CATARACT EXTRACTION W/PHACO Right 01/05/2015   Procedure: CATARACT EXTRACTION PHACO AND INTRAOCULAR LENS PLACEMENT (IOC);  Surgeon: Galen Manila, MD;  Location: [facility] ORS;  Service: Ophthalmology;  Laterality: Right;  [place]: 02:33.9 AP%: 29.9 CDE: 46.04 Lot# [id number]  . CATARACT EXTRACTION W/PHACO Left 01/19/2015   Procedure: CATARACT EXTRACTION PHACO AND INTRAOCULAR LENS PLACEMENT (IOC);  Surgeon: Galen Manila, MD;  Location: [facility] ORS;  Service: Ophthalmology;  Laterality: Left;  [place]: 03:59.3 AP%: 28.9 CDE: 69.13 Lot # [id number]  . KNEE SURGERY Right     Medical History: Past Medical History:  Diagnosis Date  . Asthma    as a child  . Cataract   . Dementia (HCC)   .  Diabetes mellitus without complication (HCC)   . History of blood clots   . HOH (hard of hearing)   . Hypertension     Family History: Family History  Problem Relation Age of Onset  . Prostate cancer Father   . Bladder Cancer Neg Hx   . Kidney cancer Neg Hx     Social History   Socioeconomic History  . Marital status: Married    Spouse name: Not on file  . Number of children: Not on file  . Years of education: Not on file  . Highest education level: Not on file  Occupational History  . Not on file  Tobacco Use  . Smoking status: Former Games developer  . Smokeless tobacco: Never Used  Substance and Sexual Activity  . Alcohol use: Yes    Alcohol/week: 0.0 standard drinks    Comment: ocassionally  . Drug use: No  . Sexual activity: Not on file  Other Topics Concern  . Not on file  Social History Narrative  . Not on file   Social Determinants of Health   Financial Resource Strain:   . Difficulty of Paying Living Expenses: Not on file  Food Insecurity:   . Worried About Programme researcher, broadcasting/film/video in the Last Year: Not on file  . Ran Out of Food in the Last Year: Not on file  Transportation Needs:   . Lack of Transportation (Medical): Not on file  . Lack of Transportation (Non-Medical): Not on file  Physical Activity:   . Days of Exercise per Week: Not on file  . Minutes of Exercise per Session: Not on file  Stress:   . Feeling of Stress : Not on file  Social Connections:   . Frequency of Communication with Friends and Family: Not on file  . Frequency of Social Gatherings with Friends and Family: Not on file  . Attends Religious Services: Not on file  . Active Member of Clubs or Organizations: Not on file  . Attends Banker Meetings: Not on file  . Marital Status: Not on file  Intimate Partner Violence:   . Fear of Current or Ex-Partner: Not on file  . Emotionally Abused: Not on file  . Physically Abused: Not on file  . Sexually Abused: Not on file       Review of Systems  Constitutional: Negative for activity change, chills, fatigue and unexpected weight change.  HENT: Negative for congestion, postnasal drip, rhinorrhea, sneezing and sore throat.   Respiratory: Negative for cough, chest tightness, shortness of breath and wheezing.   Cardiovascular: Negative for chest pain and palpitations.  Gastrointestinal: Negative for abdominal pain, constipation, diarrhea, nausea and vomiting.  Endocrine: Negative for cold intolerance, heat intolerance, polydipsia and polyuria.       Blood sugars doing well   Musculoskeletal: Negative for arthralgias, back pain, joint swelling and neck pain.  Skin: Negative for rash.  Allergic/Immunologic: Negative for environmental allergies.  Neurological: Positive for weakness. Negative for tremors and numbness.       Memory issues with worsening dementia.   Hematological: Negative for adenopathy. Does not bruise/bleed easily.  Psychiatric/Behavioral: Negative for behavioral problems (Depression), sleep disturbance and suicidal ideas. The patient is not nervous/anxious.     Today's Vitals   04/25/19 1538  BP: 118/78  Pulse: 75  Resp: 16  Temp: (!) 95.9 F (35.5 C)  SpO2: 96%  Weight: 159 lb (72.1 kg)  Height: 5\' 9"  (1.753 m)   Body mass index is 23.48 kg/m.  Physical Exam Vitals and nursing note reviewed.  Constitutional:      General: He is not in acute distress.    Appearance: Normal appearance. He is well-developed. He is not diaphoretic.  HENT:     Head: Normocephalic and atraumatic.     Nose: Nose normal.     Mouth/Throat:     Pharynx: No oropharyngeal exudate.  Eyes:     Extraocular Movements: Extraocular movements intact.     Pupils: Pupils are equal, round, and reactive to light.  Neck:     Thyroid: No thyromegaly.     Vascular: No carotid bruit or JVD.     Trachea: No tracheal deviation.  Cardiovascular:     Rate and Rhythm: Normal rate and regular rhythm.     Heart sounds:  Normal heart sounds. No murmur. No friction rub. No gallop.   Pulmonary:     Effort: Pulmonary effort is normal. No respiratory distress.     Breath sounds: Normal breath sounds. No wheezing or rales.  Chest:     Chest wall: No tenderness.  Abdominal:     Palpations: Abdomen is soft.     Tenderness: There is no abdominal tenderness.  Genitourinary:    Comments: Urine sample is positive for ketones, protein, and trace blood.  Musculoskeletal:        General: Normal range of motion.     Cervical back: Normal range of motion and neck supple.  Lymphadenopathy:     Cervical: No cervical adenopathy.  Skin:    General: Skin is warm and dry.  Neurological:     Mental Status: He is alert and oriented to person, place, and time. Mental status is at baseline.     Cranial Nerves: No cranial nerve deficit.  Psychiatric:        Mood and Affect: Mood is depressed.        Behavior: Behavior normal.        Thought Content: Thought content normal.        Judgment: Judgment normal.   Assessment/Plan: 1. Uncontrolled type 2 diabetes mellitus with hyperglycemia (HCC) - POCT HgB A1C 7.2 today. Continue all diabetic medication as prescribed  2. Acute pulmonary embolism with acute cor pulmonale, unspecified pulmonary embolism type [facility]) Reviewed patient's hospitalization record from 03/2019. Had DVT and PE as result of COVID 19 infection. He is stable. Currently on Eliquis without incident.   3. Acute deep vein thrombosis (DVT) of distal vein of lower extremity, unspecified laterality ([place]) Reviewed patient's hospitalization record from 03/2019. Had DVT and PE as result of COVID 19 infection. He is stable. Currently on Eliquis without incident.   4. Essential hypertension Stable. Continue bp medication as prescribed   5. Dementia with behavioral disturbance, unspecified dementia type [facility]) Patient living in memory care unit.   General Counseling: Faraz verbalizes understanding of the findings of  todays visit and agrees with plan  of treatment. I have discussed any further diagnostic evaluation that may be needed or ordered today. We also reviewed his medications today. he has been encouraged to call the office with any questions or concerns that should arise related to todays visit.    Counseling:  Diabetes Counseling:  1. Addition of ACE inh/ ARB'S for nephroprotection. Microalbumin is updated  2. Diabetic foot care, prevention of complications. Podiatry consult 3. Exercise and lose weight.  4. Diabetic eye examination, Diabetic eye exam is updated  5. Monitor blood sugar closlely. nutrition counseling.  6. Sign and symptoms of hypoglycemia including shaking sweating,confusion and headaches.  This patient was seen by Vincent GrosHeather Kyheem Bathgate FNP Collaboration with Dr Lyndon CodeFozia M Khan as a part of collaborative care agreement   Orders Placed This Encounter  Procedures  . POCT HgB A1C      I have reviewed all medical records from hospital follow up including radiology reports and consults from other physicians. Appropriate follow up diagnostics will be scheduled as needed. Patient/ Family understands the plan of treatment. Time spent 30 minutes.   Dr Lyndon CodeFozia M Khan, MD Internal Medicine

## 2019-04-25 NOTE — Telephone Encounter (Signed)
Confirmed appointment with patients son. Jerrell Mylar

## 2019-04-28 DIAGNOSIS — M6281 Muscle weakness (generalized): Secondary | ICD-10-CM | POA: Diagnosis not present

## 2019-04-28 DIAGNOSIS — R2681 Unsteadiness on feet: Secondary | ICD-10-CM | POA: Diagnosis not present

## 2019-04-30 DIAGNOSIS — R2681 Unsteadiness on feet: Secondary | ICD-10-CM | POA: Diagnosis not present

## 2019-04-30 DIAGNOSIS — M6281 Muscle weakness (generalized): Secondary | ICD-10-CM | POA: Diagnosis not present

## 2019-05-05 DIAGNOSIS — R2681 Unsteadiness on feet: Secondary | ICD-10-CM | POA: Diagnosis not present

## 2019-05-05 DIAGNOSIS — M6281 Muscle weakness (generalized): Secondary | ICD-10-CM | POA: Diagnosis not present

## 2019-05-06 DIAGNOSIS — M6281 Muscle weakness (generalized): Secondary | ICD-10-CM | POA: Diagnosis not present

## 2019-05-06 DIAGNOSIS — R2681 Unsteadiness on feet: Secondary | ICD-10-CM | POA: Diagnosis not present

## 2019-05-07 DIAGNOSIS — M6281 Muscle weakness (generalized): Secondary | ICD-10-CM | POA: Diagnosis not present

## 2019-05-07 DIAGNOSIS — R2681 Unsteadiness on feet: Secondary | ICD-10-CM | POA: Diagnosis not present

## 2019-05-09 DIAGNOSIS — R278 Other lack of coordination: Secondary | ICD-10-CM | POA: Diagnosis not present

## 2019-05-09 DIAGNOSIS — R2681 Unsteadiness on feet: Secondary | ICD-10-CM | POA: Diagnosis not present

## 2019-05-09 DIAGNOSIS — M6281 Muscle weakness (generalized): Secondary | ICD-10-CM | POA: Diagnosis not present

## 2019-05-09 DIAGNOSIS — R293 Abnormal posture: Secondary | ICD-10-CM | POA: Diagnosis not present

## 2019-05-12 DIAGNOSIS — M6281 Muscle weakness (generalized): Secondary | ICD-10-CM | POA: Diagnosis not present

## 2019-05-12 DIAGNOSIS — R278 Other lack of coordination: Secondary | ICD-10-CM | POA: Diagnosis not present

## 2019-05-12 DIAGNOSIS — R293 Abnormal posture: Secondary | ICD-10-CM | POA: Diagnosis not present

## 2019-05-13 DIAGNOSIS — R278 Other lack of coordination: Secondary | ICD-10-CM | POA: Diagnosis not present

## 2019-05-13 DIAGNOSIS — R293 Abnormal posture: Secondary | ICD-10-CM | POA: Diagnosis not present

## 2019-05-13 DIAGNOSIS — M6281 Muscle weakness (generalized): Secondary | ICD-10-CM | POA: Diagnosis not present

## 2019-05-14 ENCOUNTER — Telehealth: Payer: Self-pay

## 2019-05-14 DIAGNOSIS — R293 Abnormal posture: Secondary | ICD-10-CM | POA: Diagnosis not present

## 2019-05-14 DIAGNOSIS — M6281 Muscle weakness (generalized): Secondary | ICD-10-CM | POA: Diagnosis not present

## 2019-05-14 DIAGNOSIS — R278 Other lack of coordination: Secondary | ICD-10-CM | POA: Diagnosis not present

## 2019-05-14 NOTE — Telephone Encounter (Signed)
Confirmed and screened for 05-16-19 ov. 

## 2019-05-15 DIAGNOSIS — M6281 Muscle weakness (generalized): Secondary | ICD-10-CM | POA: Diagnosis not present

## 2019-05-15 DIAGNOSIS — R293 Abnormal posture: Secondary | ICD-10-CM | POA: Diagnosis not present

## 2019-05-15 DIAGNOSIS — R278 Other lack of coordination: Secondary | ICD-10-CM | POA: Diagnosis not present

## 2019-05-16 ENCOUNTER — Ambulatory Visit: Payer: Self-pay | Admitting: Nurse Practitioner

## 2019-05-16 DIAGNOSIS — R278 Other lack of coordination: Secondary | ICD-10-CM | POA: Diagnosis not present

## 2019-05-16 DIAGNOSIS — R293 Abnormal posture: Secondary | ICD-10-CM | POA: Diagnosis not present

## 2019-05-16 DIAGNOSIS — M6281 Muscle weakness (generalized): Secondary | ICD-10-CM | POA: Diagnosis not present

## 2019-05-19 DIAGNOSIS — R293 Abnormal posture: Secondary | ICD-10-CM | POA: Diagnosis not present

## 2019-05-19 DIAGNOSIS — R278 Other lack of coordination: Secondary | ICD-10-CM | POA: Diagnosis not present

## 2019-05-19 DIAGNOSIS — M6281 Muscle weakness (generalized): Secondary | ICD-10-CM | POA: Diagnosis not present

## 2019-05-20 DIAGNOSIS — R293 Abnormal posture: Secondary | ICD-10-CM | POA: Diagnosis not present

## 2019-05-20 DIAGNOSIS — M6281 Muscle weakness (generalized): Secondary | ICD-10-CM | POA: Diagnosis not present

## 2019-05-20 DIAGNOSIS — R278 Other lack of coordination: Secondary | ICD-10-CM | POA: Diagnosis not present

## 2019-05-21 DIAGNOSIS — M6281 Muscle weakness (generalized): Secondary | ICD-10-CM | POA: Diagnosis not present

## 2019-05-21 DIAGNOSIS — R293 Abnormal posture: Secondary | ICD-10-CM | POA: Diagnosis not present

## 2019-05-21 DIAGNOSIS — R278 Other lack of coordination: Secondary | ICD-10-CM | POA: Diagnosis not present

## 2019-05-22 DIAGNOSIS — R293 Abnormal posture: Secondary | ICD-10-CM | POA: Diagnosis not present

## 2019-05-22 DIAGNOSIS — M6281 Muscle weakness (generalized): Secondary | ICD-10-CM | POA: Diagnosis not present

## 2019-05-22 DIAGNOSIS — R278 Other lack of coordination: Secondary | ICD-10-CM | POA: Diagnosis not present

## 2019-05-23 DIAGNOSIS — R293 Abnormal posture: Secondary | ICD-10-CM | POA: Diagnosis not present

## 2019-05-23 DIAGNOSIS — R278 Other lack of coordination: Secondary | ICD-10-CM | POA: Diagnosis not present

## 2019-05-23 DIAGNOSIS — M6281 Muscle weakness (generalized): Secondary | ICD-10-CM | POA: Diagnosis not present

## 2019-05-26 DIAGNOSIS — M6281 Muscle weakness (generalized): Secondary | ICD-10-CM | POA: Diagnosis not present

## 2019-05-26 DIAGNOSIS — R278 Other lack of coordination: Secondary | ICD-10-CM | POA: Diagnosis not present

## 2019-05-26 DIAGNOSIS — R293 Abnormal posture: Secondary | ICD-10-CM | POA: Diagnosis not present

## 2019-05-27 DIAGNOSIS — M6281 Muscle weakness (generalized): Secondary | ICD-10-CM | POA: Diagnosis not present

## 2019-05-27 DIAGNOSIS — R278 Other lack of coordination: Secondary | ICD-10-CM | POA: Diagnosis not present

## 2019-05-27 DIAGNOSIS — R293 Abnormal posture: Secondary | ICD-10-CM | POA: Diagnosis not present

## 2019-05-28 DIAGNOSIS — R278 Other lack of coordination: Secondary | ICD-10-CM | POA: Diagnosis not present

## 2019-05-28 DIAGNOSIS — M6281 Muscle weakness (generalized): Secondary | ICD-10-CM | POA: Diagnosis not present

## 2019-05-28 DIAGNOSIS — R293 Abnormal posture: Secondary | ICD-10-CM | POA: Diagnosis not present

## 2019-05-29 DIAGNOSIS — R293 Abnormal posture: Secondary | ICD-10-CM | POA: Diagnosis not present

## 2019-05-29 DIAGNOSIS — M6281 Muscle weakness (generalized): Secondary | ICD-10-CM | POA: Diagnosis not present

## 2019-05-29 DIAGNOSIS — R278 Other lack of coordination: Secondary | ICD-10-CM | POA: Diagnosis not present

## 2019-05-30 DIAGNOSIS — R278 Other lack of coordination: Secondary | ICD-10-CM | POA: Diagnosis not present

## 2019-05-30 DIAGNOSIS — M6281 Muscle weakness (generalized): Secondary | ICD-10-CM | POA: Diagnosis not present

## 2019-05-30 DIAGNOSIS — R293 Abnormal posture: Secondary | ICD-10-CM | POA: Diagnosis not present

## 2019-06-02 DIAGNOSIS — R293 Abnormal posture: Secondary | ICD-10-CM | POA: Diagnosis not present

## 2019-06-02 DIAGNOSIS — M6281 Muscle weakness (generalized): Secondary | ICD-10-CM | POA: Diagnosis not present

## 2019-06-02 DIAGNOSIS — R278 Other lack of coordination: Secondary | ICD-10-CM | POA: Diagnosis not present

## 2019-06-03 DIAGNOSIS — M6281 Muscle weakness (generalized): Secondary | ICD-10-CM | POA: Diagnosis not present

## 2019-06-03 DIAGNOSIS — R293 Abnormal posture: Secondary | ICD-10-CM | POA: Diagnosis not present

## 2019-06-03 DIAGNOSIS — R278 Other lack of coordination: Secondary | ICD-10-CM | POA: Diagnosis not present

## 2019-06-04 DIAGNOSIS — R278 Other lack of coordination: Secondary | ICD-10-CM | POA: Diagnosis not present

## 2019-06-04 DIAGNOSIS — R293 Abnormal posture: Secondary | ICD-10-CM | POA: Diagnosis not present

## 2019-06-04 DIAGNOSIS — M6281 Muscle weakness (generalized): Secondary | ICD-10-CM | POA: Diagnosis not present

## 2019-06-06 DIAGNOSIS — M6281 Muscle weakness (generalized): Secondary | ICD-10-CM | POA: Diagnosis not present

## 2019-06-06 DIAGNOSIS — R293 Abnormal posture: Secondary | ICD-10-CM | POA: Diagnosis not present

## 2019-06-06 DIAGNOSIS — R278 Other lack of coordination: Secondary | ICD-10-CM | POA: Diagnosis not present

## 2019-06-09 DIAGNOSIS — R293 Abnormal posture: Secondary | ICD-10-CM | POA: Diagnosis not present

## 2019-06-09 DIAGNOSIS — M6281 Muscle weakness (generalized): Secondary | ICD-10-CM | POA: Diagnosis not present

## 2019-06-09 DIAGNOSIS — R278 Other lack of coordination: Secondary | ICD-10-CM | POA: Diagnosis not present

## 2019-06-10 DIAGNOSIS — M6281 Muscle weakness (generalized): Secondary | ICD-10-CM | POA: Diagnosis not present

## 2019-06-10 DIAGNOSIS — R293 Abnormal posture: Secondary | ICD-10-CM | POA: Diagnosis not present

## 2019-06-10 DIAGNOSIS — R278 Other lack of coordination: Secondary | ICD-10-CM | POA: Diagnosis not present

## 2019-06-11 ENCOUNTER — Telehealth: Payer: Self-pay

## 2019-06-11 DIAGNOSIS — R2681 Unsteadiness on feet: Secondary | ICD-10-CM | POA: Diagnosis not present

## 2019-06-11 DIAGNOSIS — M6281 Muscle weakness (generalized): Secondary | ICD-10-CM | POA: Diagnosis not present

## 2019-06-11 NOTE — Telephone Encounter (Signed)
CONFIRMED AND SCREENED WITH SON IN LAW FOR 06-13-19 OV.

## 2019-06-13 ENCOUNTER — Ambulatory Visit (INDEPENDENT_AMBULATORY_CARE_PROVIDER_SITE_OTHER): Payer: Medicare Other | Admitting: Nurse Practitioner

## 2019-06-13 ENCOUNTER — Telehealth: Payer: Self-pay

## 2019-06-13 ENCOUNTER — Other Ambulatory Visit: Payer: Self-pay

## 2019-06-13 ENCOUNTER — Encounter: Payer: Self-pay | Admitting: Nurse Practitioner

## 2019-06-13 VITALS — BP 138/78 | HR 73 | Temp 96.7°F | Resp 16 | Ht 69.0 in | Wt 170.4 lb

## 2019-06-13 DIAGNOSIS — I10 Essential (primary) hypertension: Secondary | ICD-10-CM | POA: Diagnosis not present

## 2019-06-13 DIAGNOSIS — R293 Abnormal posture: Secondary | ICD-10-CM | POA: Diagnosis not present

## 2019-06-13 DIAGNOSIS — F0391 Unspecified dementia with behavioral disturbance: Secondary | ICD-10-CM | POA: Diagnosis not present

## 2019-06-13 DIAGNOSIS — M6281 Muscle weakness (generalized): Secondary | ICD-10-CM | POA: Diagnosis not present

## 2019-06-13 DIAGNOSIS — R300 Dysuria: Secondary | ICD-10-CM | POA: Diagnosis not present

## 2019-06-13 DIAGNOSIS — R2681 Unsteadiness on feet: Secondary | ICD-10-CM | POA: Diagnosis not present

## 2019-06-13 DIAGNOSIS — Z0001 Encounter for general adult medical examination with abnormal findings: Secondary | ICD-10-CM

## 2019-06-13 DIAGNOSIS — R278 Other lack of coordination: Secondary | ICD-10-CM | POA: Diagnosis not present

## 2019-06-13 DIAGNOSIS — E119 Type 2 diabetes mellitus without complications: Secondary | ICD-10-CM

## 2019-06-13 NOTE — Telephone Encounter (Signed)
Patient scheduled at [facility] on 09/11/2019 @ 2:15pm with Galen Manila.  [phone]

## 2019-06-13 NOTE — Progress Notes (Signed)
[facility] [address] [place], [place] [address]  Internal MEDICINE  Office Visit Note  Patient Name: Tyler Jimenez  [id number]  [id number]  Date of Service: 06/15/2019   Pt is here for routine health maintenance examination   Chief Complaint  Patient presents with  . Medicare Wellness  . Diabetes  . Hypertension     The patient is here for health maintenance exam. He is currently residing in assisted living, memory care unit. He had HgbA1c done at last visit 04/2019 and was 7.2. blood sugars continue to be stable. Blood pressure slightly elevated upon arrival. Improved after some period of rest. No new concerns or complaints today.   Current Medication: Outpatient Encounter Medications as of 06/13/2019  Medication Sig  . acetaminophen (TYLENOL) 500 MG tablet Take 500 mg by mouth every 6 (six) hours as needed for mild pain.  Marland Kitchen amLODipine (NORVASC) 10 MG tablet Take 10 mg by mouth daily.  Marland Kitchen ELIQUIS 5 MG TABS tablet Take 5 mg by mouth 2 (two) times daily.  Marland Kitchen glimepiride (AMARYL) 2 MG tablet Take 1 tablet (2 mg total) by mouth daily.  Marland Kitchen glucose blood (FREESTYLE LITE) test strip 1 each by Other route 2 (two) times daily. diag e11.65  . guaifenesin (ROBITUSSIN) 100 MG/5ML syrup Take 200 mg by mouth every 6 (six) hours as needed for cough.  Marland Kitchen KOMBIGLYZE XR 09-998 MG TB24 Take 1 tablet by mouth daily with supper.  . loperamide (IMODIUM) 2 MG capsule Take 2-4 mg by mouth as needed for diarrhea or loose stools.  Marland Kitchen LORazepam (ATIVAN) 0.5 MG tablet Take 0.5 mg by mouth every 8 (eight) hours as needed (agitation).  . magnesium hydroxide (MILK OF MAGNESIA) 400 MG/5ML suspension Take 30 mLs by mouth daily as needed for mild constipation.  . mirtazapine (REMERON) 7.5 MG tablet Take 1 tablet (7.5 mg total) by mouth at bedtime as needed.  . Multiple Vitamin (MULTI-VITAMINS) TABS Take 1 tablet by mouth daily.   . pioglitazone (ACTOS) 45 MG tablet Take 1 tablet (45 mg total) by  mouth daily.   No facility-administered encounter medications on file as of 06/13/2019.    Surgical History: Past Surgical History:  Procedure Laterality Date  . CATARACT EXTRACTION W/PHACO Right 01/05/2015   Procedure: CATARACT EXTRACTION PHACO AND INTRAOCULAR LENS PLACEMENT (IOC);  Surgeon: Galen Manila, MD;  Location: [facility] ORS;  Service: Ophthalmology;  Laterality: Right;  [place]: 02:33.9 AP%: 29.9 CDE: 46.04 Lot# [id number]  . CATARACT EXTRACTION W/PHACO Left 01/19/2015   Procedure: CATARACT EXTRACTION PHACO AND INTRAOCULAR LENS PLACEMENT (IOC);  Surgeon: Galen Manila, MD;  Location: [facility] ORS;  Service: Ophthalmology;  Laterality: Left;  [place]: 03:59.3 AP%: 28.9 CDE: 69.13 Lot # [id number]  . KNEE SURGERY Right     Medical History: Past Medical History:  Diagnosis Date  . Asthma    as a child  . Cataract   . Dementia (HCC)   . Diabetes mellitus without complication (HCC)   . History of blood clots   . HOH (hard of hearing)   . Hypertension     Family History: Family History  Problem Relation Age of Onset  . Prostate cancer Father   . Bladder Cancer Neg Hx   . Kidney cancer Neg Hx       Review of Systems  Constitutional: Negative for activity change, chills, fatigue and unexpected weight change.  HENT: Negative for congestion, postnasal drip, rhinorrhea, sneezing and sore throat.   Respiratory: Negative for cough, chest tightness, shortness  of breath and wheezing.   Cardiovascular: Negative for chest pain and palpitations.  Gastrointestinal: Negative for abdominal pain, constipation, diarrhea, nausea and vomiting.  Endocrine: Negative for cold intolerance, heat intolerance, polydipsia and polyuria.       Blood sugars doing well   Genitourinary: Negative for dysuria, frequency and urgency.  Musculoskeletal: Negative for arthralgias, back pain, joint swelling and neck pain.  Skin: Negative for rash.  Allergic/Immunologic: Negative for environmental allergies.   Neurological: Positive for weakness. Negative for tremors and numbness.       Memory issues with worsening dementia. Stable since his last visit .  Hematological: Negative for adenopathy. Does not bruise/bleed easily.  Psychiatric/Behavioral: Negative for behavioral problems (Depression), sleep disturbance and suicidal ideas. The patient is not nervous/anxious.      Today's Vitals   06/13/19 1419  BP: 138/78  Pulse: 73  Resp: 16  Temp: (!) 96.7 F (35.9 C)  SpO2: 94%  Weight: 170 lb 6.4 oz (77.3 kg)  Height: 5\' 9"  (1.753 m)   Body mass index is 25.16 kg/m.  Physical Exam Vitals and nursing note reviewed.  Constitutional:      General: He is not in acute distress.    Appearance: Normal appearance. He is well-developed. He is not diaphoretic.  HENT:     Head: Normocephalic and atraumatic.     Nose: Nose normal.     Mouth/Throat:     Pharynx: No oropharyngeal exudate.  Eyes:     Extraocular Movements: Extraocular movements intact.     Pupils: Pupils are equal, round, and reactive to light.  Neck:     Thyroid: No thyromegaly.     Vascular: No carotid bruit or JVD.     Trachea: No tracheal deviation.  Cardiovascular:     Rate and Rhythm: Normal rate and regular rhythm.     Pulses: Normal pulses.     Heart sounds: Normal heart sounds. No murmur. No friction rub. No gallop.   Pulmonary:     Effort: Pulmonary effort is normal. No respiratory distress.     Breath sounds: Normal breath sounds. No wheezing or rales.  Chest:     Chest wall: No tenderness.  Abdominal:     General: Bowel sounds are normal.     Palpations: Abdomen is soft.     Tenderness: There is no abdominal tenderness.  Musculoskeletal:        General: Normal range of motion.     Cervical back: Normal range of motion and neck supple.  Lymphadenopathy:     Cervical: No cervical adenopathy.  Skin:    General: Skin is warm and dry.  Neurological:     Mental Status: He is alert and oriented to person,  place, and time. Mental status is at baseline.     Cranial Nerves: No cranial nerve deficit.  Psychiatric:        Mood and Affect: Mood is depressed.        Behavior: Behavior normal.        Thought Content: Thought content normal.        Judgment: Judgment normal.    Depression screen [facility] 2/9 06/13/2019 06/13/2019 04/25/2019 01/17/2019 08/30/2018  Decreased Interest 0 0 0 0 0  Down, Depressed, Hopeless 0 - 0 0 0  PHQ - 2 Score 0 0 0 0 0    Functional Status Survey: Is the patient deaf or have difficulty hearing?: No Does the patient have difficulty seeing, even when wearing glasses/contacts?: No Does the patient  have difficulty concentrating, remembering, or making decisions?: Yes Does the patient have difficulty walking or climbing stairs?: No Does the patient have difficulty dressing or bathing?: No Does the patient have difficulty doing errands alone such as visiting a doctor's office or shopping?: Yes  MMSE - Mini Mental State Exam 06/13/2019  Not completed: Unable to complete    Fall Risk  06/13/2019 06/13/2019 04/25/2019 01/17/2019 08/30/2018  Falls in the past year? 0 0 0 1 1  Number falls in past yr: - - 0 0 0  Injury with Fall? - - 0 0 0  Risk Factor Category  - - - - -  Follow up - - - - -      LABS: Recent Results (from the past 2160 hour(s))  Basic metabolic panel     Status: Abnormal   Collection Time: 03/25/19  3:54 PM  Result Value Ref Range   Sodium 143 135 - 145 mmol/L   Potassium 5.8 (H) 3.5 - 5.1 mmol/L   Chloride 106 98 - 111 mmol/L   CO2 21 (L) 22 - 32 mmol/L   Glucose, Bld 768 (HH) 70 - 99 mg/dL    Comment: CRITICAL RESULT CALLED TO, READ BACK BY AND VERIFIED WITH: K FIELDS,RN 1657 03/25/2019 WBOND    BUN 53 (H) 8 - 23 mg/dL   Creatinine, Ser 1.61 (H) 0.61 - 1.24 mg/dL   Calcium 9.8 8.9 - 09.6 mg/dL   GFR calc non Af Amer 33 (L) >60 mL/min   GFR calc Af Amer 39 (L) >60 mL/min   Anion gap 16 (H) 5 - 15    Comment: Performed at [facility] Lab,  [address] [place], [place] [address]  CBC     Status: Abnormal   Collection Time: 03/25/19  3:54 PM  Result Value Ref Range   WBC 12.5 (H) 4.0 - 10.5 K/uL   RBC 5.45 4.22 - 5.81 MIL/uL   Hemoglobin 14.9 13.0 - 17.0 g/dL   HCT 98.1 19.1 - 47.8 %   MCV 87.9 80.0 - 100.0 fL   MCH 27.3 26.0 - 34.0 pg   MCHC 31.1 30.0 - 36.0 g/dL   RDW 29.5 62.1 - 30.8 %   Platelets 158 150 - 400 K/uL   nRBC 0.2 0.0 - 0.2 %    Comment: Performed at [facility] Lab, [address] [place], [place] [address]  CBG monitoring, ED     Status: Abnormal   Collection Time: 03/25/19  4:19 PM  Result Value Ref Range   Glucose-Capillary >600 (HH) 70 - 99 mg/dL   Comment 1 Notify RN    Comment 2 Document in Chart   Hepatic function panel     Status: Abnormal   Collection Time: 03/25/19  4:20 PM  Result Value Ref Range   Total Protein 8.1 6.5 - 8.1 g/dL   Albumin 3.5 3.5 - 5.0 g/dL   AST 38 15 - 41 U/L   ALT 46 (H) 0 - 44 U/L   Alkaline Phosphatase 56 38 - 126 U/L   Total Bilirubin 0.9 0.3 - 1.2 mg/dL   Bilirubin, Direct 0.3 (H) 0.0 - 0.2 mg/dL   Indirect Bilirubin 0.6 0.3 - 0.9 mg/dL    Comment: Performed at [facility] Lab, [address], [place], [place] [address]  Lipase, blood     Status: Abnormal   Collection Time: 03/25/19  4:20 PM  Result Value Ref Range   Lipase 68 (H) 11 - 51  U/L    Comment: Performed at [facility] Lab, [address], [place], [place] [address]  SARS CORONAVIRUS 2 (TAT 6-24 HRS) Nasopharyngeal Nasopharyngeal Swab     Status: Abnormal   Collection Time: 03/25/19  5:27 PM   Specimen: Nasopharyngeal Swab  Result Value Ref Range   SARS Coronavirus 2 POSITIVE (A) NEGATIVE    Comment: RESULT CALLED TO, READ BACK BY AND VERIFIED WITH: RN A MCKEOWN @0826  03/26/19 BY S GEZAHEGN (NOTE) SARS-CoV-2 target nucleic acids are DETECTED. The SARS-CoV-2 RNA is generally detectable in upper and lower respiratory specimens during the acute phase of infection. Positive results are  indicative of active infection with SARS-CoV-2. Clinical  correlation with patient history and other diagnostic information is necessary to determine patient infection status. Positive results do  not rule out bacterial infection or co-infection with other viruses. The expected result is Negative. Fact Sheet for Patients: HairSlick.no Fact Sheet for Healthcare Providers: quierodirigir.com This test is not yet approved or cleared by the [place] FDA and  has been authorized for detection and/or diagnosis of SARS-CoV-2 by FDA under an Emergency Use Authorization (EUA). This EUA will remain  in effect (meaning this test can be used)  for the duration of the COVID-19 declaration under Section 564(b)(1) of the Act, 21 U.S.C. section 360bbb-3(b)(1), unless the authorization is terminated or revoked sooner. Performed at [facility] Lab, [address], [place], [place] [address]   CBG monitoring, ED     Status: Abnormal   Collection Time: 03/25/19  6:14 PM  Result Value Ref Range   Glucose-Capillary 590 (HH) 70 - 99 mg/dL   Comment 1 Document in Chart   Basic metabolic panel     Status: Abnormal   Collection Time: 03/25/19  7:09 PM  Result Value Ref Range   Sodium 149 (H) 135 - 145 mmol/L   Potassium 4.6 3.5 - 5.1 mmol/L   Chloride 115 (H) 98 - 111 mmol/L   CO2 20 (L) 22 - 32 mmol/L   Glucose, Bld 528 (HH) 70 - 99 mg/dL    Comment: CRITICAL RESULT CALLED TO, READ BACK BY AND VERIFIED WITH: K [facility] 1944 03/25/2019 WBOND    BUN 53 (H) 8 - 23 mg/dL   Creatinine, Ser 0.98 (H) 0.61 - 1.24 mg/dL   Calcium 9.8 8.9 - 11.9 mg/dL   GFR calc non Af Amer 32 (L) >60 mL/min   GFR calc Af Amer 38 (L) >60 mL/min   Anion gap 14 5 - 15    Comment: Performed at [facility] Lab, [address], [place], [place] [address]  Beta-hydroxybutyric acid     Status: Abnormal   Collection Time: 03/25/19  7:09 PM  Result Value Ref Range    Beta-Hydroxybutyric Acid 0.57 (H) 0.05 - 0.27 mmol/L    Comment: Performed at [facility] Lab, [address] [place], [place] [address]  CBC with Differential (PNL)     Status: Abnormal   Collection Time: 03/25/19  7:09 PM  Result Value Ref Range   WBC 12.6 (H) 4.0 - 10.5 K/uL   RBC 5.44 4.22 - 5.81 MIL/uL   Hemoglobin 15.1 13.0 - 17.0 g/dL   HCT 30.8 65.7 - 84.6 %   MCV 87.5 80.0 - 100.0 fL   MCH 27.8 26.0 - 34.0 pg   MCHC 31.7 30.0 - 36.0 g/dL   RDW 96.2 95.2 - 84.1 %   Platelets 156 150 - 400 K/uL   nRBC 0.0  0.0 - 0.2 %   Neutrophils Relative % 89 %   Neutro Abs 11.3 (H) 1.7 - 7.7 K/uL   Lymphocytes Relative 7 %   Lymphs Abs 0.9 0.7 - 4.0 K/uL   Monocytes Relative 3 %   Monocytes Absolute 0.4 0.1 - 1.0 K/uL   Eosinophils Relative 0 %   Eosinophils Absolute 0.0 0.0 - 0.5 K/uL   Basophils Relative 0 %   Basophils Absolute 0.0 0.0 - 0.1 K/uL   Immature Granulocytes 1 %   Abs Immature Granulocytes 0.06 0.00 - 0.07 K/uL    Comment: Performed at [facility] Lab, [address] [place], [place] [address]  Lactic acid, plasma     Status: Abnormal   Collection Time: 03/25/19  7:09 PM  Result Value Ref Range   Lactic Acid, Venous 6.1 (HH) 0.5 - 1.9 mmol/L    Comment: CRITICAL RESULT CALLED TO, READ BACK BY AND VERIFIED WITH: K [facility] 1946 03/25/2019 WBOND Performed at [facility] Lab, [address], [place], [place] [address]   Blood culture (routine x 2)     Status: None   Collection Time: 03/25/19  7:09 PM   Specimen: BLOOD  Result Value Ref Range   Specimen Description BLOOD RIGHT ANTECUBITAL    Special Requests      BOTTLES DRAWN AEROBIC AND ANAEROBIC Blood Culture results may not be optimal due to an inadequate volume of blood received in culture bottles   Culture      NO GROWTH 5 DAYS Performed at [facility] Lab, [address] [place], [place] [address]    Report Status 03/30/2019 FINAL   Blood culture (routine x 2)     Status: None   Collection  Time: 03/25/19  7:14 PM   Specimen: BLOOD  Result Value Ref Range   Specimen Description BLOOD LEFT ANTECUBITAL    Special Requests      BOTTLES DRAWN AEROBIC AND ANAEROBIC Blood Culture adequate volume   Culture      NO GROWTH 5 DAYS Performed at [facility] Lab, [address], [place], [place] [address]    Report Status 03/30/2019 FINAL   CBG monitoring, ED     Status: Abnormal   Collection Time: 03/25/19  7:21 PM  Result Value Ref Range   Glucose-Capillary 570 (HH) 70 - 99 mg/dL   Comment 1 Document in Chart   POCT I-Stat EG7     Status: Abnormal   Collection Time: 03/25/19  7:47 PM  Result Value Ref Range   pH, Ven 7.310 7.250 - 7.430   pCO2, Ven 44.0 44.0 - 60.0 mmHg   pO2, Ven 127.0 (H) 32.0 - 45.0 mmHg   Bicarbonate 22.1 20.0 - 28.0 mmol/L   TCO2 23 22 - 32 mmol/L   O2 Saturation 99.0 %   Acid-base deficit 4.0 (H) 0.0 - 2.0 mmol/L   Sodium 150 (H) 135 - 145 mmol/L   Potassium 4.6 3.5 - 5.1 mmol/L   Calcium, Ion 1.26 1.15 - 1.40 mmol/L   HCT 46.0 39.0 - 52.0 %   Hemoglobin 15.6 13.0 - 17.0 g/dL   Patient temperature HIDE    Sample type VENOUS   CBG monitoring, ED     Status: Abnormal   Collection Time: 03/25/19  7:57 PM  Result Value Ref Range   Glucose-Capillary 440 (H) 70 - 99 mg/dL  CBG monitoring, ED     Status: Abnormal   Collection Time: 03/25/19  8:35 PM  Result  Value Ref Range   Glucose-Capillary 359 (H) 70 - 99 mg/dL  CBG monitoring, ED     Status: Abnormal   Collection Time: 03/25/19  9:46 PM  Result Value Ref Range   Glucose-Capillary 299 (H) 70 - 99 mg/dL  CBG monitoring, ED     Status: Abnormal   Collection Time: 03/25/19 10:44 PM  Result Value Ref Range   Glucose-Capillary 201 (H) 70 - 99 mg/dL  ABO/Rh     Status: None   Collection Time: 03/25/19 11:26 PM  Result Value Ref Range   ABO/RH(D)      A POS Performed at [facility] Lab, [address], [place], [place] [address]   CBG monitoring, ED     Status: Abnormal   Collection Time:  03/26/19 12:38 AM  Result Value Ref Range   Glucose-Capillary >600 (HH) 70 - 99 mg/dL  Beta-hydroxybutyric acid     Status: None   Collection Time: 03/26/19 12:50 AM  Result Value Ref Range   Beta-Hydroxybutyric Acid 0.08 0.05 - 0.27 mmol/L    Comment: Performed at [facility] Lab, [address], [place], [place] [address]  Lactic acid, plasma     Status: Abnormal   Collection Time: 03/26/19 12:50 AM  Result Value Ref Range   Lactic Acid, Venous 3.1 (HH) 0.5 - 1.9 mmol/L    Comment: CRITICAL VALUE NOTED.  VALUE IS CONSISTENT WITH PREVIOUSLY REPORTED AND CALLED VALUE. Performed at [facility] Lab, [address] [place], [place] [address]   Hemoglobin A1c     Status: Abnormal   Collection Time: 03/26/19 12:50 AM  Result Value Ref Range   Hgb A1c MFr Bld 8.9 (H) 4.8 - 5.6 %    Comment: (NOTE) Pre diabetes:          5.7%-6.4% Diabetes:              >6.4% Glycemic control for   <7.0% adults with diabetes    Mean Plasma Glucose 208.73 mg/dL    Comment: Performed at [facility] Lab, [address], [place], [place] [address]  Lactate dehydrogenase     Status: Abnormal   Collection Time: 03/26/19 12:50 AM  Result Value Ref Range   LDH 315 (H) 98 - 192 U/L    Comment: Performed at [facility] Lab, [address], [place], [place] [address]  Procalcitonin     Status: None   Collection Time: 03/26/19 12:50 AM  Result Value Ref Range   Procalcitonin <0.10 ng/mL    Comment:        Interpretation: PCT (Procalcitonin) <= 0.5 ng/mL: Systemic infection (sepsis) is not likely. Local bacterial infection is possible. (NOTE)       Sepsis PCT Algorithm           Lower Respiratory Tract                                      Infection PCT Algorithm    ----------------------------     ----------------------------         PCT < 0.25 ng/mL                PCT < 0.10 ng/mL         Strongly encourage             Strongly discourage   discontinuation of antibiotics    initiation of  antibiotics    ----------------------------     -----------------------------  PCT 0.25 - 0.50 ng/mL            PCT 0.10 - 0.25 ng/mL               OR       >80% decrease in PCT            Discourage initiation of                                            antibiotics      Encourage discontinuation           of antibiotics    ----------------------------     -----------------------------         PCT >= 0.50 ng/mL              PCT 0.26 - 0.50 ng/mL               AND        <80% decrease in PCT             Encourage initiation of                                             antibiotics       Encourage continuation           of antibiotics    ----------------------------     -----------------------------        PCT >= 0.50 ng/mL                  PCT > 0.50 ng/mL               AND         increase in PCT                  Strongly encourage                                      initiation of antibiotics    Strongly encourage escalation           of antibiotics                                     -----------------------------                                           PCT <= 0.25 ng/mL                                                 OR                                        > 80% decrease in PCT                                       Discontinue / Do not initiate                                             antibiotics Performed at [facility] Lab, [address], [place], [place] [address]   Fibrinogen     Status: Abnormal   Collection Time: 03/26/19 12:50 AM  Result Value Ref Range   Fibrinogen 71 (LL) 210 - 475 mg/dL    Comment: REPEATED TO VERIFY CRITICAL RESULT CALLED TO, READ BACK BY AND VERIFIED WITH: RN M FLORES @0340  03/26/19 BY S GEZAHEGN Performed at [facility] Lab, [address] [place], [place] [address]   Ferritin     Status: Abnormal   Collection Time: 03/26/19 12:50 AM  Result Value Ref Range   Ferritin 864 (H) 24 - 336 ng/mL    Comment: Performed at [facility] Lab, [address], [place], [place] [address]  D-dimer, quantitative (not at [facility])     Status: Abnormal   Collection Time: 03/26/19 12:50 AM  Result Value Ref Range   D-Dimer, Quant >20.00 (H) 0.00 - 0.50 ug/mL-FEU    Comment: REPEATED TO VERIFY (NOTE) At the manufacturer cut-off of 0.50 ug/mL FEU, this assay has been documented to exclude PE with a sensitivity and negative predictive value of 97 to 99%.  At this time, this assay has not been approved by the FDA to exclude DVT/VTE. Results should be correlated with clinical presentation. Performed at [facility] Lab, [address], [place], [place] [address]   C-reactive protein     Status: Abnormal   Collection Time: 03/26/19 12:50 AM  Result Value Ref Range   CRP 4.0 (H) <1.0 mg/dL    Comment: Performed at [facility] Lab, [address] [place], [place] [address]  CBG monitoring, ED     Status: None   Collection Time: 03/26/19  1:10 AM  Result Value Ref Range   Glucose-Capillary 82 70 - 99 mg/dL  CBG monitoring, ED     Status: None   Collection Time: 03/26/19  1:18 AM  Result Value Ref Range   Glucose-Capillary 84 70 - 99 mg/dL  CBG monitoring, ED     Status: None   Collection Time: 03/26/19  1:43 AM  Result Value Ref Range   Glucose-Capillary 88 70 - 99 mg/dL  CBG monitoring, ED     Status: None   Collection Time: 03/26/19  2:14 AM  Result Value Ref Range   Glucose-Capillary 73 70 - 99 mg/dL  Basic metabolic panel     Status: Abnormal   Collection Time: 03/26/19  2:15 AM  Result Value Ref Range   Sodium 156 (H) 135 - 145 mmol/L   Potassium 4.4 3.5 - 5.1 mmol/L   Chloride 120 (H) 98 - 111 mmol/L   CO2 24 22 - 32 mmol/L   Glucose, Bld 76 70 - 99 mg/dL   BUN 43 (H) 8 - 23 mg/dL   Creatinine, Ser 9.62 (H) 0.61 - 1.24 mg/dL   Calcium 9.7 8.9 - 95.2 mg/dL   GFR calc non Af Amer 47 (L) >60 mL/min   GFR calc Af Amer 55 (L) >60 mL/min   Anion gap 12 5 - 15    Comment: Performed at [facility] [place] [facility]  Lab, [address] [place], [place] [address]  Magnesium  Status: Abnormal   Collection Time: 03/26/19  2:15 AM  Result Value Ref Range   Magnesium 2.6 (H) 1.7 - 2.4 mg/dL    Comment: Performed at [facility] Lab, [address], [place], [place] [address]  Phosphorus     Status: None   Collection Time: 03/26/19  2:15 AM  Result Value Ref Range   Phosphorus 3.4 2.5 - 4.6 mg/dL    Comment: Performed at [facility] Lab, [address] [place], [place] [address]  Basic metabolic panel     Status: Abnormal   Collection Time: 03/26/19  2:58 AM  Result Value Ref Range   Sodium 150 (H) 135 - 145 mmol/L   Potassium 3.9 3.5 - 5.1 mmol/L   Chloride 117 (H) 98 - 111 mmol/L   CO2 23 22 - 32 mmol/L   Glucose, Bld 219 (H) 70 - 99 mg/dL   BUN 26 (H) 8 - 23 mg/dL   Creatinine, Ser 8.65 (H) 0.61 - 1.24 mg/dL   Calcium 9.0 8.9 - 78.4 mg/dL   GFR calc non Af Amer 48 (L) >60 mL/min   GFR calc Af Amer 55 (L) >60 mL/min   Anion gap 10 5 - 15    Comment: Performed at [facility] Lab, [address], [place], [place] [address]  Heparin level (unfractionated)     Status: Abnormal   Collection Time: 03/26/19  2:58 AM  Result Value Ref Range   Heparin Unfractionated 1.09 (H) 0.30 - 0.70 IU/mL    Comment: (NOTE) If heparin results are below expected values, and patient dosage has  been confirmed, suggest follow up testing of antithrombin III levels. Performed at [facility] Lab, [address] [place], [place] [address]   APTT     Status: Abnormal   Collection Time: 03/26/19  2:59 AM  Result Value Ref Range   aPTT >200 (HH) 24 - 36 seconds    Comment:        IF BASELINE aPTT IS ELEVATED, SUGGEST PATIENT RISK ASSESSMENT BE USED TO DETERMINE APPROPRIATE ANTICOAGULANT THERAPY. REPEATED TO VERIFY CRITICAL RESULT CALLED TO, READ BACK BY AND VERIFIED WITH: J.RAMOS,RN 0533 03/27/19 G.MCADOO Performed at [facility] Lab, [address], [place], [place] [address]   CBG monitoring, ED      Status: Abnormal   Collection Time: 03/26/19  3:19 AM  Result Value Ref Range   Glucose-Capillary 104 (H) 70 - 99 mg/dL  CBG monitoring, ED     Status: Abnormal   Collection Time: 03/26/19  4:15 AM  Result Value Ref Range   Glucose-Capillary 165 (H) 70 - 99 mg/dL  CBC with Differential/Platelet     Status: Abnormal   Collection Time: 03/26/19  4:18 AM  Result Value Ref Range   WBC 13.2 (H) 4.0 - 10.5 K/uL   RBC 5.15 4.22 - 5.81 MIL/uL   Hemoglobin 13.9 13.0 - 17.0 g/dL   HCT 10.2 72.5 - 36.6 %   MCV 88.7 80.0 - 100.0 fL   MCH 27.0 26.0 - 34.0 pg   MCHC 30.4 30.0 - 36.0 g/dL   RDW 44.0 34.7 - 42.5 %   Platelets 139 (L) 150 - 400 K/uL   nRBC 0.0 0.0 - 0.2 %   Neutrophils Relative % 89 %   Neutro Abs 11.7 (H) 1.7 - 7.7 K/uL   Lymphocytes Relative 6 %   Lymphs Abs 0.8 0.7 - 4.0 K/uL   Monocytes Relative 4 %   Monocytes  Absolute 0.5 0.1 - 1.0 K/uL   Eosinophils Relative 0 %   Eosinophils Absolute 0.0 0.0 - 0.5 K/uL   Basophils Relative 0 %   Basophils Absolute 0.0 0.0 - 0.1 K/uL   Immature Granulocytes 1 %   Abs Immature Granulocytes 0.13 (H) 0.00 - 0.07 K/uL    Comment: Performed at [facility], [address] [place], [place] [address]  Comprehensive metabolic panel     Status: Abnormal   Collection Time: 03/26/19  4:18 AM  Result Value Ref Range   Sodium 157 (H) 135 - 145 mmol/L   Potassium 4.9 3.5 - 5.1 mmol/L   Chloride 119 (H) 98 - 111 mmol/L   CO2 24 22 - 32 mmol/L   Glucose, Bld 163 (H) 70 - 99 mg/dL   BUN 41 (H) 8 - 23 mg/dL   Creatinine, Ser 6.04 (H) 0.61 - 1.24 mg/dL   Calcium 9.7 8.9 - 54.0 mg/dL   Total Protein 7.2 6.5 - 8.1 g/dL   Albumin 2.9 (L) 3.5 - 5.0 g/dL   AST 41 15 - 41 U/L   ALT 39 0 - 44 U/L   Alkaline Phosphatase 46 38 - 126 U/L   Total Bilirubin 1.1 0.3 - 1.2 mg/dL   GFR calc non Af Amer 44 (L) >60 mL/min   GFR calc Af Amer 51 (L) >60 mL/min   Anion gap 14 5 - 15    Comment: Performed at [facility], [address],  [place], [place] [address]  Magnesium     Status: Abnormal   Collection Time: 03/26/19  4:18 AM  Result Value Ref Range   Magnesium 2.6 (H) 1.7 - 2.4 mg/dL    Comment: Performed at [facility], [address], [place], [place] [address]  Phosphorus     Status: None   Collection Time: 03/26/19  4:18 AM  Result Value Ref Range   Phosphorus 4.3 2.5 - 4.6 mg/dL    Comment: Performed at [facility], [address], [place], [place] [address]  CBG monitoring, ED     Status: Abnormal   Collection Time: 03/26/19  5:24 AM  Result Value Ref Range   Glucose-Capillary 154 (H) 70 - 99 mg/dL  CBG monitoring, ED     Status: Abnormal   Collection Time: 03/26/19  6:29 AM  Result Value Ref Range   Glucose-Capillary >600 (HH) 70 - 99 mg/dL    Comment: QUESTIONABLE RESULTS - CHARGE CREDITED REPEATED TO VERIFY Performed at [facility], [address] [place], [place] [address]   CBG monitoring, ED     Status: Abnormal   Collection Time: 03/26/19  6:31 AM  Result Value Ref Range   Glucose-Capillary 195 (H) 70 - 99 mg/dL  Basic metabolic panel     Status: Abnormal   Collection Time: 03/26/19  6:40 AM  Result Value Ref Range   Sodium 156 (H) 135 - 145 mmol/L   Potassium 4.4 3.5 - 5.1 mmol/L   Chloride 120 (H) 98 - 111 mmol/L   CO2 25 22 - 32 mmol/L   Glucose, Bld 215 (H) 70 - 99 mg/dL   BUN 40 (H) 8 - 23 mg/dL   Creatinine, Ser 7.84 (H) 0.61 - 1.24 mg/dL   Calcium 9.5 8.9 - 69.6 mg/dL   GFR calc non Af Amer 46 (L) >60 mL/min   GFR calc Af Amer 54 (L) >60 mL/min   Anion gap 11 5 -  15    Comment: Performed at [facility], [address] [place], [place] [address]  CBG monitoring, ED     Status: Abnormal   Collection Time: 03/26/19  8:11 AM  Result Value Ref Range   Glucose-Capillary 244 (H) 70 - 99 mg/dL  CBG monitoring, ED     Status: Abnormal   Collection Time: 03/26/19  9:42 AM  Result Value Ref Range   Glucose-Capillary 205 (H) 70 - 99 mg/dL  CBG monitoring, ED      Status: Abnormal   Collection Time: 03/26/19 10:59 AM  Result Value Ref Range   Glucose-Capillary 180 (H) 70 - 99 mg/dL  Beta-hydroxybutyric acid     Status: None   Collection Time: 03/26/19 11:17 AM  Result Value Ref Range   Beta-Hydroxybutyric Acid 0.10 0.05 - 0.27 mmol/L    Comment: Performed at [facility], [address], [place], [place] [address]  Basic metabolic panel     Status: Abnormal   Collection Time: 03/26/19 11:17 AM  Result Value Ref Range   Sodium 156 (H) 135 - 145 mmol/L   Potassium 4.6 3.5 - 5.1 mmol/L   Chloride 123 (H) 98 - 111 mmol/L   CO2 22 22 - 32 mmol/L   Glucose, Bld 182 (H) 70 - 99 mg/dL   BUN 36 (H) 8 - 23 mg/dL   Creatinine, Ser 0.98 (H) 0.61 - 1.24 mg/dL   Calcium 9.5 8.9 - 11.9 mg/dL   GFR calc non Af Amer 49 (L) >60 mL/min   GFR calc Af Amer 57 (L) >60 mL/min   Anion gap 11 5 - 15    Comment: Performed at [facility], [address], [place], [place] [address]  C-reactive protein     Status: Abnormal   Collection Time: 03/26/19 11:17 AM  Result Value Ref Range   CRP 2.6 (H) <1.0 mg/dL    Comment: Performed at [facility], [address], [place], [place] [address]  Ferritin     Status: Abnormal   Collection Time: 03/26/19 11:17 AM  Result Value Ref Range   Ferritin 601 (H) 24 - 336 ng/mL    Comment: Performed at [facility], [address] [place] [address] [place], [place] [address]  D-dimer, quantitative (not at [facility])     Status: Abnormal   Collection Time: 03/26/19 11:17 AM  Result Value Ref Range   D-Dimer, Quant >20.00 (H) 0.00 - 0.50 ug/mL-FEU    Comment: REPEATED TO VERIFY (NOTE) At the manufacturer cut-off of 0.50 ug/mL FEU, this assay has been documented to exclude PE with a sensitivity and negative predictive value of 97 to 99%.  At this time, this assay has not been approved by the FDA to exclude DVT/VTE. Results should be correlated with clinical presentation. Performed at [facility], [address]  [place], [place] [address]   CBG monitoring, ED     Status: Abnormal   Collection Time: 03/26/19 12:10 PM  Result Value Ref Range   Glucose-Capillary 152 (H) 70 - 99 mg/dL  CBG monitoring, ED     Status: Abnormal   Collection Time: 03/26/19  2:49 PM  Result Value Ref Range   Glucose-Capillary 190 (H) 70 - 99 mg/dL  CBG monitoring, ED     Status: Abnormal   Collection Time: 03/26/19  4:27 PM  Result Value Ref Range   Glucose-Capillary 236 (H) 70 - 99 mg/dL  CBG monitoring, ED  Status: Abnormal   Collection Time: 03/26/19  5:53 PM  Result Value Ref Range   Glucose-Capillary 240 (H) 70 - 99 mg/dL  CBG monitoring, ED     Status: Abnormal   Collection Time: 03/26/19 10:03 PM  Result Value Ref Range   Glucose-Capillary 135 (H) 70 - 99 mg/dL  CBG monitoring, ED     Status: Abnormal   Collection Time: 03/27/19  1:56 AM  Result Value Ref Range   Glucose-Capillary 176 (H) 70 - 99 mg/dL  Lactic acid, plasma     Status: Abnormal   Collection Time: 03/27/19  3:27 AM  Result Value Ref Range   Lactic Acid, Venous 2.6 (HH) 0.5 - 1.9 mmol/L    Comment: CRITICAL RESULT CALLED TO, READ BACK BY AND VERIFIED WITH: RAMOS J,RN 03/27/19 0409 WAYK Performed at [facility], [address], [place], [place] [address]   CBC with Differential/Platelet     Status: Abnormal   Collection Time: 03/27/19  5:00 AM  Result Value Ref Range   WBC 12.6 (H) 4.0 - 10.5 K/uL   RBC 4.44 4.22 - 5.81 MIL/uL   Hemoglobin 12.0 (L) 13.0 - 17.0 g/dL   HCT 19.1 47.8 - 29.5 %   MCV 90.8 80.0 - 100.0 fL   MCH 27.0 26.0 - 34.0 pg   MCHC 29.8 (L) 30.0 - 36.0 g/dL   RDW 62.1 30.8 - 65.7 %   Platelets 96 (L) 150 - 400 K/uL    Comment: REPEATED TO VERIFY PLATELET COUNT CONFIRMED BY SMEAR Immature Platelet Fraction may be clinically indicated, consider ordering this additional test [id number]    nRBC 0.6 (H) 0.0 - 0.2 %   Neutrophils Relative % 88 %   Neutro Abs 11.1 (H) 1.7 - 7.7 K/uL   Lymphocytes Relative 7 %    Lymphs Abs 0.9 0.7 - 4.0 K/uL   Monocytes Relative 4 %   Monocytes Absolute 0.5 0.1 - 1.0 K/uL   Eosinophils Relative 0 %   Eosinophils Absolute 0.0 0.0 - 0.5 K/uL   Basophils Relative 0 %   Basophils Absolute 0.0 0.0 - 0.1 K/uL   Immature Granulocytes 1 %   Abs Immature Granulocytes 0.13 (H) 0.00 - 0.07 K/uL    Comment: Performed at [facility], [address], [place], [place] [address]  Comprehensive metabolic panel     Status: Abnormal   Collection Time: 03/27/19  5:00 AM  Result Value Ref Range   Sodium 133 (L) 135 - 145 mmol/L   Potassium 4.5 3.5 - 5.1 mmol/L   Chloride 99 98 - 111 mmol/L   CO2 23 22 - 32 mmol/L   Glucose, Bld 724 (HH) 70 - 99 mg/dL    Comment: CRITICAL RESULT CALLED TO, READ BACK BY AND VERIFIED WITH: HOPE DOOLEY,RN AT 0721 03/27/2019 BY ZBEECH.    BUN 24 (H) 8 - 23 mg/dL   Creatinine, Ser 2.44 0.61 - 1.24 mg/dL   Calcium 8.4 (L) 8.9 - 10.3 mg/dL   Total Protein 5.8 (L) 6.5 - 8.1 g/dL   Albumin 2.4 (L) 3.5 - 5.0 g/dL   AST 30 15 - 41 U/L   ALT 31 0 - 44 U/L   Alkaline Phosphatase 44 38 - 126 U/L   Total Bilirubin 1.1 0.3 - 1.2 mg/dL   GFR calc non Af Amer 54 (L) >60 mL/min   GFR calc Af Amer >60 >60 mL/min   Anion gap 11 5 - 15    Comment:  Performed at [facility], [address] [place], [place] [address]  C-reactive protein     Status: Abnormal   Collection Time: 03/27/19  5:00 AM  Result Value Ref Range   CRP 2.5 (H) <1.0 mg/dL    Comment: Performed at [facility], [address] [place], [place] [address]  D-dimer, quantitative (not at [facility])     Status: Abnormal   Collection Time: 03/27/19  5:00 AM  Result Value Ref Range   D-Dimer, Quant >20.00 (H) 0.00 - 0.50 ug/mL-FEU    Comment: REPEATED TO VERIFY (NOTE) At the manufacturer cut-off of 0.50 ug/mL FEU, this assay has been documented to exclude PE with a sensitivity and negative predictive value of 97 to 99%.  At this time, this assay has not been approved by the FDA  to exclude DVT/VTE. Results should be correlated with clinical presentation. Performed at [facility], [address] [place], [place] [address]   Ferritin     Status: Abnormal   Collection Time: 03/27/19  5:00 AM  Result Value Ref Range   Ferritin 427 (H) 24 - 336 ng/mL    Comment: Performed at [facility], [address] [place], [place] [address]  Magnesium     Status: None   Collection Time: 03/27/19  5:00 AM  Result Value Ref Range   Magnesium 1.9 1.7 - 2.4 mg/dL    Comment: Performed at [facility], [address] [place], [place] [address]  Phosphorus     Status: None   Collection Time: 03/27/19  5:00 AM  Result Value Ref Range   Phosphorus 3.7 2.5 - 4.6 mg/dL    Comment: Performed at [facility], [address] [place], [place] [address]  Heparin level (unfractionated)     Status: Abnormal   Collection Time: 03/27/19  5:00 AM  Result Value Ref Range   Heparin Unfractionated 1.01 (H) 0.30 - 0.70 IU/mL    Comment: (NOTE) If heparin results are below expected values, and patient dosage has  been confirmed, suggest follow up testing of antithrombin III levels. Performed at [facility], [address] [place], [place] [address]   CBG monitoring, ED     Status: Abnormal   Collection Time: 03/27/19  6:29 AM  Result Value Ref Range   Glucose-Capillary 256 (H) 70 - 99 mg/dL  CBG monitoring, ED     Status: Abnormal   Collection Time: 03/27/19  7:25 AM  Result Value Ref Range   Glucose-Capillary 244 (H) 70 - 99 mg/dL  Basic metabolic panel     Status: Abnormal   Collection Time: 03/27/19  8:19 AM  Result Value Ref Range   Sodium 148 (H) 135 - 145 mmol/L   Potassium 4.6 3.5 - 5.1 mmol/L   Chloride 113 (H) 98 - 111 mmol/L   CO2 22 22 - 32 mmol/L   Glucose, Bld 331 (H) 70 - 99 mg/dL   BUN 25 (H) 8 - 23 mg/dL   Creatinine, Ser 9.621.26 (H) 0.61 - 1.24 mg/dL   Calcium 9.2 8.9 - 95.210.3 mg/dL   GFR calc non Af Amer 52 (L) >60 mL/min   GFR calc  Af Amer 60 (L) >60 mL/min   Anion gap 13 5 - 15    Comment: Performed at [facility], [address] [place], [place] [address]  Lactic acid, plasma     Status: Abnormal   Collection Time: 03/27/19  8:19 AM  Result Value Ref Range   Lactic Acid, Venous 3.2 (HH) 0.5 - 1.9 mmol/L    Comment: CRITICAL VALUE NOTED.  VALUE IS CONSISTENT WITH PREVIOUSLY REPORTED AND CALLED VALUE. Performed at [facility], [address] [place], [place] [address]   APTT     Status: Abnormal   Collection Time: 03/27/19  9:04 AM  Result Value Ref Range   aPTT 196 (HH) 24 - 36 seconds    Comment:        IF BASELINE aPTT IS ELEVATED, SUGGEST PATIENT RISK ASSESSMENT BE USED TO DETERMINE APPROPRIATE ANTICOAGULANT THERAPY. REPEATED TO VERIFY CRITICAL RESULT CALLED TO, READ BACK BY AND VERIFIED WITH: K.BROWN,RN 03/27/2019 1007 DAVISB Performed at [facility], [address], [place], [place] [address]   ECHOCARDIOGRAM COMPLETE     Status: None   Collection Time: 03/27/19 10:11 AM  Result Value Ref Range   Weight 2,800 oz   Height 66 in   BP 131/92 mmHg  CBG monitoring, ED     Status: Abnormal   Collection Time: 03/27/19 11:14 AM  Result Value Ref Range   Glucose-Capillary 268 (H) 70 - 99 mg/dL  Heparin level (unfractionated)     Status: Abnormal   Collection Time: 03/27/19  1:38 PM  Result Value Ref Range   Heparin Unfractionated 1.14 (H) 0.30 - 0.70 IU/mL    Comment: RESULTS CONFIRMED BY MANUAL DILUTION Performed at [facility], [address] [place], [place] [address]   APTT     Status: Abnormal   Collection Time: 03/27/19  1:38 PM  Result Value Ref Range   aPTT 193 (HH) 24 - 36 seconds    Comment:        IF BASELINE aPTT IS ELEVATED, SUGGEST PATIENT RISK ASSESSMENT BE USED TO DETERMINE APPROPRIATE ANTICOAGULANT THERAPY. REPEATED TO VERIFY CRITICAL RESULT CALLED TO, READ BACK BY AND VERIFIED WITH: H DOOLEY,RN 1537 03/27/2019 WBOND Performed at [facility], [address] [place], [place] [address]   CBC     Status: Abnormal   Collection Time: 03/27/19  1:38 PM  Result Value Ref Range   WBC 16.3 (H) 4.0 - 10.5 K/uL   RBC 5.25 4.22 - 5.81 MIL/uL   Hemoglobin 14.3 13.0 - 17.0 g/dL   HCT 40.3 47.4 - 25.9 %   MCV 87.6 80.0 - 100.0 fL   MCH 27.2 26.0 - 34.0 pg   MCHC 31.1 30.0 - 36.0 g/dL   RDW 56.3 87.5 - 64.3 %   Platelets 103 (L) 150 - 400 K/uL    Comment: REPEATED TO VERIFY Immature Platelet Fraction may be clinically indicated, consider ordering this additional test [id number] CONSISTENT WITH PREVIOUS RESULT    nRBC 0.0 0.0 - 0.2 %    Comment: Performed at [facility], [address], [place], [place] [address]  Lactic acid, plasma     Status: Abnormal   Collection Time: 03/27/19  1:38 PM  Result Value Ref Range   Lactic Acid, Venous 3.8 (HH) 0.5 - 1.9 mmol/L    Comment: CRITICAL VALUE NOTED.  VALUE IS CONSISTENT WITH PREVIOUSLY REPORTED AND CALLED VALUE. Performed at [facility], [address], [place], [place] [address]   CBG monitoring, ED     Status: Abnormal   Collection Time: 03/27/19  4:04 PM  Result Value Ref Range   Glucose-Capillary 224 (H) 70 - 99 mg/dL  CBG monitoring, ED  Status: None   Collection Time: 03/27/19  7:31 PM  Result Value Ref Range   Glucose-Capillary 98 70 - 99 mg/dL  CBG monitoring, ED     Status: None   Collection Time: 03/27/19 11:07 PM  Result Value Ref Range   Glucose-Capillary 72 70 - 99 mg/dL  CBG monitoring, ED     Status: None   Collection Time: 03/27/19 11:54 PM  Result Value Ref Range   Glucose-Capillary 80 70 - 99 mg/dL  MRSA PCR Screening     Status: None   Collection Time: 03/28/19 12:58 AM   Specimen: Nasopharyngeal  Result Value Ref Range   MRSA by PCR NEGATIVE NEGATIVE    Comment:        The GeneXpert MRSA Assay (FDA approved for NASAL specimens only), is one component of a comprehensive MRSA colonization surveillance program. It is  not intended to diagnose MRSA infection nor to guide or monitor treatment for MRSA infections. Performed at [facility], [address], [place], [place] [address]   Heparin level (unfractionated)     Status: None   Collection Time: 03/28/19  1:17 AM  Result Value Ref Range   Heparin Unfractionated 0.47 0.30 - 0.70 IU/mL    Comment: (NOTE) If heparin results are below expected values, and patient dosage has  been confirmed, suggest follow up testing of antithrombin III levels. Performed at [facility], [address], [place], [place] [address]   APTT     Status: Abnormal   Collection Time: 03/28/19  1:17 AM  Result Value Ref Range   aPTT 110 (H) 24 - 36 seconds    Comment:        IF BASELINE aPTT IS ELEVATED, SUGGEST PATIENT RISK ASSESSMENT BE USED TO DETERMINE APPROPRIATE ANTICOAGULANT THERAPY. Performed at [facility], [address] [place], [place] [address]   CBC with Differential/Platelet     Status: Abnormal   Collection Time: 03/28/19  1:17 AM  Result Value Ref Range   WBC 13.5 (H) 4.0 - 10.5 K/uL   RBC 4.65 4.22 - 5.81 MIL/uL   Hemoglobin 12.8 (L) 13.0 - 17.0 g/dL   HCT 61.4 43.1 - 54.0 %   MCV 86.2 80.0 - 100.0 fL   MCH 27.5 26.0 - 34.0 pg   MCHC 31.9 30.0 - 36.0 g/dL   RDW 08.6 76.1 - 95.0 %   Platelets 102 (L) 150 - 400 K/uL    Comment: Immature Platelet Fraction may be clinically indicated, consider ordering this additional test [id number] CONSISTENT WITH PREVIOUS RESULT    nRBC 0.1 0.0 - 0.2 %   Neutrophils Relative % 86 %   Neutro Abs 11.6 (H) 1.7 - 7.7 K/uL   Lymphocytes Relative 8 %   Lymphs Abs 1.0 0.7 - 4.0 K/uL   Monocytes Relative 5 %   Monocytes Absolute 0.7 0.1 - 1.0 K/uL   Eosinophils Relative 0 %   Eosinophils Absolute 0.0 0.0 - 0.5 K/uL   Basophils Relative 0 %   Basophils Absolute 0.0 0.0 - 0.1 K/uL   Immature Granulocytes 1 %   Abs Immature Granulocytes 0.14 (H) 0.00 - 0.07 K/uL    Comment: Performed at [facility], [address] [place], [place] [address]  Comprehensive metabolic panel     Status: Abnormal   Collection Time: 03/28/19  1:17 AM  Result Value Ref Range   Sodium 150 (H) 135 - 145 mmol/L   Potassium 3.8 3.5 -  5.1 mmol/L   Chloride 118 (H) 98 - 111 mmol/L   CO2 22 22 - 32 mmol/L   Glucose, Bld 113 (H) 70 - 99 mg/dL   BUN 21 8 - 23 mg/dL   Creatinine, Ser 1.61 0.61 - 1.24 mg/dL   Calcium 8.9 8.9 - 09.6 mg/dL   Total Protein 5.9 (L) 6.5 - 8.1 g/dL   Albumin 2.5 (L) 3.5 - 5.0 g/dL   AST 29 15 - 41 U/L   ALT 31 0 - 44 U/L   Alkaline Phosphatase 41 38 - 126 U/L   Total Bilirubin 0.8 0.3 - 1.2 mg/dL   GFR calc non Af Amer 58 (L) >60 mL/min   GFR calc Af Amer >60 >60 mL/min   Anion gap 10 5 - 15    Comment: Performed at [facility], [address], [place], [place] [address]  C-reactive protein     Status: Abnormal   Collection Time: 03/28/19  1:17 AM  Result Value Ref Range   CRP 5.6 (H) <1.0 mg/dL    Comment: Performed at [facility], [address] [place], [place] [address]  D-dimer, quantitative (not at [facility])     Status: Abnormal   Collection Time: 03/28/19  1:17 AM  Result Value Ref Range   D-Dimer, Quant >20.00 (H) 0.00 - 0.50 ug/mL-FEU    Comment: REPEATED TO VERIFY (NOTE) At the manufacturer cut-off of 0.50 ug/mL FEU, this assay has been documented to exclude PE with a sensitivity and negative predictive value of 97 to 99%.  At this time, this assay has not been approved by the FDA to exclude DVT/VTE. Results should be correlated with clinical presentation. Performed at [facility], [address], [place], [place] [address]   Ferritin     Status: Abnormal   Collection Time: 03/28/19  1:17 AM  Result Value Ref Range   Ferritin 576 (H) 24 - 336 ng/mL    Comment: Performed at [facility], [address], [place], [place] [address]  Magnesium     Status: None   Collection Time: 03/28/19  1:17 AM  Result Value Ref Range    Magnesium 2.1 1.7 - 2.4 mg/dL    Comment: Performed at [facility], [address] [place], [place] [address]  Phosphorus     Status: None   Collection Time: 03/28/19  1:17 AM  Result Value Ref Range   Phosphorus 3.7 2.5 - 4.6 mg/dL    Comment: Performed at [facility], [address], [place], [place] [address]  Lactic acid, plasma     Status: Abnormal   Collection Time: 03/28/19  1:17 AM  Result Value Ref Range   Lactic Acid, Venous 2.5 (HH) 0.5 - 1.9 mmol/L    Comment: CRITICAL VALUE NOTED.  VALUE IS CONSISTENT WITH PREVIOUSLY REPORTED AND CALLED VALUE. Performed at [facility], [address] [place], [place] [address]   Glucose, capillary     Status: Abnormal   Collection Time: 03/28/19  7:42 AM  Result Value Ref Range   Glucose-Capillary 162 (H) 70 - 99 mg/dL  Glucose, capillary     Status: Abnormal   Collection Time: 03/28/19 11:24 AM  Result Value Ref Range   Glucose-Capillary 189 (H) 70 - 99 mg/dL  Basic metabolic panel     Status: Abnormal   Collection Time: 03/28/19  3:14 PM  Result Value Ref Range   Sodium 149 (H) 135 -  145 mmol/L   Potassium 3.9 3.5 - 5.1 mmol/L   Chloride 116 (H) 98 - 111 mmol/L   CO2 24 22 - 32 mmol/L   Glucose, Bld 205 (H) 70 - 99 mg/dL   BUN 24 (H) 8 - 23 mg/dL   Creatinine, Ser 7.67 0.61 - 1.24 mg/dL   Calcium 8.8 (L) 8.9 - 10.3 mg/dL   GFR calc non Af Amer 56 (L) >60 mL/min   GFR calc Af Amer >60 >60 mL/min   Anion gap 9 5 - 15    Comment: Performed at [facility], [address], [place], [place] [address]  APTT     Status: Abnormal   Collection Time: 03/28/19  3:14 PM  Result Value Ref Range   aPTT 131 (H) 24 - 36 seconds    Comment:        IF BASELINE aPTT IS ELEVATED, SUGGEST PATIENT RISK ASSESSMENT BE USED TO DETERMINE APPROPRIATE ANTICOAGULANT THERAPY. REPEATED TO VERIFY Performed at [facility], [address], [place], [place] [address]   Heparin level (unfractionated)     Status: None    Collection Time: 03/28/19  3:40 PM  Result Value Ref Range   Heparin Unfractionated 0.45 0.30 - 0.70 IU/mL    Comment: (NOTE) If heparin results are below expected values, and patient dosage has  been confirmed, suggest follow up testing of antithrombin III levels. Performed at [facility], [address], [place], [place] [address]   Glucose, capillary     Status: Abnormal   Collection Time: 03/28/19  4:50 PM  Result Value Ref Range   Glucose-Capillary 114 (H) 70 - 99 mg/dL  Glucose, capillary     Status: Abnormal   Collection Time: 03/28/19  8:24 PM  Result Value Ref Range   Glucose-Capillary 181 (H) 70 - 99 mg/dL  Glucose, capillary     Status: Abnormal   Collection Time: 03/29/19  1:31 AM  Result Value Ref Range   Glucose-Capillary 204 (H) 70 - 99 mg/dL  Glucose, capillary     Status: Abnormal   Collection Time: 03/29/19  3:40 AM  Result Value Ref Range   Glucose-Capillary 183 (H) 70 - 99 mg/dL  CBC with Differential/Platelet     Status: Abnormal   Collection Time: 03/29/19  4:03 AM  Result Value Ref Range   WBC 9.7 4.0 - 10.5 K/uL   RBC 4.83 4.22 - 5.81 MIL/uL   Hemoglobin 13.2 13.0 - 17.0 g/dL   HCT 32.9 92.4 - 26.8 %   MCV 87.6 80.0 - 100.0 fL   MCH 27.3 26.0 - 34.0 pg   MCHC 31.2 30.0 - 36.0 g/dL   RDW 34.1 96.2 - 22.9 %   Platelets 115 (L) 150 - 400 K/uL    Comment: REPEATED TO VERIFY PLATELET COUNT CONFIRMED BY SMEAR SPECIMEN CHECKED FOR CLOTS CONSISTENT WITH PREVIOUS RESULT    nRBC 0.0 0.0 - 0.2 %   Neutrophils Relative % 74 %   Neutro Abs 7.2 1.7 - 7.7 K/uL   Lymphocytes Relative 17 %   Lymphs Abs 1.6 0.7 - 4.0 K/uL   Monocytes Relative 7 %   Monocytes Absolute 0.7 0.1 - 1.0 K/uL   Eosinophils Relative 1 %   Eosinophils Absolute 0.1 0.0 - 0.5 K/uL   Basophils Relative 0 %   Basophils Absolute 0.0 0.0 - 0.1 K/uL   Immature Granulocytes 1 %   Abs Immature Granulocytes 0.11 (H) 0.00 - 0.07 K/uL  Comment: Performed at [facility],  [address] [place], [place] [id number]  Comprehensive metabolic panel     Status: Abnormal   Collection Time: 03/29/19  4:03 AM  Result Value Ref Range   Sodium 148 (H) 135 - 145 mmol/L   Potassium 3.5 3.5 - 5.1 mmol/L   Chloride 114 (H) 98 - 111 mmol/L   CO2 22 22 - 32 mmol/L   Glucose, Bld 198 (H) 70 - 99 mg/dL   BUN 19 8 - 23 mg/dL   Creatinine, Ser 9.141.14 0.61 - 1.24 mg/dL   Calcium 8.7 (L) 8.9 - 10.3 mg/dL   Total Protein 6.3 (L) 6.5 - 8.1 g/dL   Albumin 2.4 (L) 3.5 - 5.0 g/dL   AST 20 15 - 41 U/L   ALT 27 0 - 44 U/L   Alkaline Phosphatase 42 38 - 126 U/L   Total Bilirubin 0.7 0.3 - 1.2 mg/dL   GFR calc non Af Amer 58 (L) >60 mL/min   GFR calc Af Amer >60 >60 mL/min   Anion gap 12 5 - 15    Comment: Performed at [facility], [address] [place], [place] [address]  C-reactive protein     Status: Abnormal   Collection Time: 03/29/19  4:03 AM  Result Value Ref Range   CRP 7.2 (H) <1.0 mg/dL    Comment: Performed at [facility], [address] [place], [place] [address]  D-dimer, quantitative (not at [facility])     Status: Abnormal   Collection Time: 03/29/19  4:03 AM  Result Value Ref Range   D-Dimer, Quant 12.23 (H) 0.00 - 0.50 ug/mL-FEU    Comment: (NOTE) At the manufacturer cut-off of 0.50 ug/mL FEU, this assay has been documented to exclude PE with a sensitivity and negative predictive value of 97 to 99%.  At this time, this assay has not been approved by the FDA to exclude DVT/VTE. Results should be correlated with clinical presentation. Performed at [facility], [address] [place], [place] [address]   Ferritin     Status: Abnormal   Collection Time: 03/29/19  4:03 AM  Result Value Ref Range   Ferritin 566 (H) 24 - 336 ng/mL    Comment: Performed at [facility], [address] [place], [place] [address]  Magnesium     Status: None   Collection Time: 03/29/19  4:03 AM  Result Value Ref Range   Magnesium 2.1 1.7 - 2.4 mg/dL     Comment: Performed at [facility], [address] [place], [place] [address]  Phosphorus     Status: None   Collection Time: 03/29/19  4:03 AM  Result Value Ref Range   Phosphorus 3.8 2.5 - 4.6 mg/dL    Comment: Performed at [facility], [address] [place], [place] [address]  Glucose, capillary     Status: Abnormal   Collection Time: 03/29/19  8:04 AM  Result Value Ref Range   Glucose-Capillary 170 (H) 70 - 99 mg/dL  Heparin level (unfractionated)     Status: None   Collection Time: 03/29/19 10:17 AM  Result Value Ref Range   Heparin Unfractionated 0.41 0.30 - 0.70 IU/mL    Comment: (NOTE) If heparin results are below expected values, and patient dosage has  been confirmed, suggest follow up testing of antithrombin III levels. Performed at [facility], [address] [place], [place] [address]  Glucose, capillary     Status: Abnormal   Collection Time: 03/29/19 11:39 AM  Result Value Ref Range   Glucose-Capillary 220 (H) 70 - 99 mg/dL  Glucose, capillary     Status: Abnormal   Collection Time: 03/29/19  4:10 PM  Result Value Ref Range   Glucose-Capillary 257 (H) 70 - 99 mg/dL  Glucose, capillary     Status: None   Collection Time: 03/29/19  8:58 PM  Result Value Ref Range   Glucose-Capillary 97 70 - 99 mg/dL  CBC with Differential/Platelet     Status: Abnormal   Collection Time: 03/30/19  4:05 AM  Result Value Ref Range   WBC 9.0 4.0 - 10.5 K/uL   RBC 4.46 4.22 - 5.81 MIL/uL   Hemoglobin 12.0 (L) 13.0 - 17.0 g/dL   HCT 39.3 39.0 - 52.0 %   MCV 88.1 80.0 - 100.0 fL   MCH 26.9 26.0 - 34.0 pg   MCHC 30.5 30.0 - 36.0 g/dL   RDW 14.0 11.5 - 15.5 %   Platelets 124 (L) 150 - 400 K/uL   nRBC 0.0 0.0 - 0.2 %   Neutrophils Relative % 72 %   Neutro Abs 6.4 1.7 - 7.7 K/uL   Lymphocytes Relative 17 %   Lymphs Abs 1.6 0.7 - 4.0 K/uL   Monocytes Relative 9 %   Monocytes Absolute 0.8 0.1 - 1.0 K/uL   Eosinophils Relative 1 %   Eosinophils Absolute 0.1  0.0 - 0.5 K/uL   Basophils Relative 0 %   Basophils Absolute 0.0 0.0 - 0.1 K/uL   Immature Granulocytes 1 %   Abs Immature Granulocytes 0.12 (H) 0.00 - 0.07 K/uL    Comment: Performed at [facility], [address], [place], [place] [address]  Comprehensive metabolic panel     Status: Abnormal   Collection Time: 03/30/19  4:05 AM  Result Value Ref Range   Sodium 148 (H) 135 - 145 mmol/L   Potassium 4.0 3.5 - 5.1 mmol/L   Chloride 116 (H) 98 - 111 mmol/L   CO2 24 22 - 32 mmol/L   Glucose, Bld 159 (H) 70 - 99 mg/dL   BUN 14 8 - 23 mg/dL   Creatinine, Ser 1.03 0.61 - 1.24 mg/dL   Calcium 8.7 (L) 8.9 - 10.3 mg/dL   Total Protein 5.9 (L) 6.5 - 8.1 g/dL   Albumin 2.3 (L) 3.5 - 5.0 g/dL   AST 18 15 - 41 U/L   ALT 22 0 - 44 U/L   Alkaline Phosphatase 42 38 - 126 U/L   Total Bilirubin 0.6 0.3 - 1.2 mg/dL   GFR calc non Af Amer >60 >60 mL/min   GFR calc Af Amer >60 >60 mL/min   Anion gap 8 5 - 15    Comment: Performed at [facility], [place] [address], [place], [place] [address]  C-reactive protein     Status: Abnormal   Collection Time: 03/30/19  4:05 AM  Result Value Ref Range   CRP 8.3 (H) <1.0 mg/dL    Comment: Performed at [facility] [address], [place], [place] [address]  D-dimer, quantitative (not at [facility])     Status: Abnormal   Collection Time: 03/30/19  4:05 AM  Result Value Ref Range   D-Dimer, Quant 7.93 (H) 0.00 - 0.50 ug/mL-FEU    Comment: (NOTE) At the manufacturer cut-off of 0.50 ug/mL FEU, this assay has been documented to exclude PE with a  sensitivity and negative predictive value of 97 to 99%.  At this time, this assay has not been approved by the FDA to exclude DVT/VTE. Results should be correlated with clinical presentation. Performed at [facility], [address], [place], [place] [address]   Ferritin     Status: Abnormal   Collection Time: 03/30/19  4:05 AM  Result Value Ref Range   Ferritin 568 (H) 24 - 336 ng/mL     Comment: Performed at [facility], [address] [place], [place] [address]  Magnesium     Status: None   Collection Time: 03/30/19  4:05 AM  Result Value Ref Range   Magnesium 2.0 1.7 - 2.4 mg/dL    Comment: Performed at [facility], [address], [place], [place] [address]  Phosphorus     Status: None   Collection Time: 03/30/19  4:05 AM  Result Value Ref Range   Phosphorus 3.1 2.5 - 4.6 mg/dL    Comment: Performed at [facility], [address], [place], [place] [address]  Heparin level (unfractionated)     Status: Abnormal   Collection Time: 03/30/19  4:05 AM  Result Value Ref Range   Heparin Unfractionated 0.15 (L) 0.30 - 0.70 IU/mL    Comment: (NOTE) If heparin results are below expected values, and patient dosage has  been confirmed, suggest follow up testing of antithrombin III levels. Performed at [facility], [address] [place], [place] [address]   Glucose, capillary     Status: Abnormal   Collection Time: 03/30/19  7:40 AM  Result Value Ref Range   Glucose-Capillary 149 (H) 70 - 99 mg/dL  Glucose, capillary     Status: Abnormal   Collection Time: 03/30/19 11:23 AM  Result Value Ref Range   Glucose-Capillary 122 (H) 70 - 99 mg/dL  Ammonia     Status: None   Collection Time: 03/30/19 12:26 PM  Result Value Ref Range   Ammonia 16 9 - 35 umol/L    Comment: Performed at [facility], [address] [place], [place] [address]  TSH     Status: None   Collection Time: 03/30/19 12:26 PM  Result Value Ref Range   TSH 0.976 0.350 - 4.500 uIU/mL    Comment: Performed by a 3rd Generation assay with a functional sensitivity of <=0.01 uIU/mL. Performed at [facility], [address] [place], [place] [address]   Vitamin B12     Status: Abnormal   Collection Time: 03/30/19 12:26 PM  Result Value Ref Range   Vitamin B-12 1,348 (H) 180 - 914 pg/mL    Comment: (NOTE) This assay is not validated for testing neonatal  or myeloproliferative syndrome specimens for Vitamin B12 levels. Performed at [facility], [address] [place], [place], [place] [address]   Folate     Status: None   Collection Time: 03/30/19 12:26 PM  Result Value Ref Range   Folate 39.8 >5.9 ng/mL    Comment: Performed at [facility], [address] [place], [place] [address]  Glucose, capillary     Status: Abnormal   Collection Time: 03/30/19  4:30 PM  Result Value Ref Range   Glucose-Capillary 45 (L) 70 - 99 mg/dL  Glucose, capillary     Status: Abnormal   Collection Time: 03/30/19  5:04 PM  Result Value Ref Range   Glucose-Capillary 118 (H) 70 - 99 mg/dL  Glucose, capillary  Status: Abnormal   Collection Time: 03/30/19  9:32 PM  Result Value Ref Range   Glucose-Capillary 133 (H) 70 - 99 mg/dL  Glucose, capillary     Status: Abnormal   Collection Time: 03/31/19  8:36 AM  Result Value Ref Range   Glucose-Capillary 200 (H) 70 - 99 mg/dL  CBC with Differential/Platelet     Status: Abnormal   Collection Time: 03/31/19 12:36 PM  Result Value Ref Range   WBC 8.0 4.0 - 10.5 K/uL   RBC 4.31 4.22 - 5.81 MIL/uL   Hemoglobin 11.9 (L) 13.0 - 17.0 g/dL   HCT 16.1 (L) 09.6 - 04.5 %   MCV 87.9 80.0 - 100.0 fL   MCH 27.6 26.0 - 34.0 pg   MCHC 31.4 30.0 - 36.0 g/dL   RDW 40.9 81.1 - 91.4 %   Platelets 146 (L) 150 - 400 K/uL   nRBC 0.0 0.0 - 0.2 %   Neutrophils Relative % 73 %   Neutro Abs 5.9 1.7 - 7.7 K/uL   Lymphocytes Relative 14 %   Lymphs Abs 1.1 0.7 - 4.0 K/uL   Monocytes Relative 11 %   Monocytes Absolute 0.9 0.1 - 1.0 K/uL   Eosinophils Relative 1 %   Eosinophils Absolute 0.1 0.0 - 0.5 K/uL   Basophils Relative 0 %   Basophils Absolute 0.0 0.0 - 0.1 K/uL   Immature Granulocytes 1 %   Abs Immature Granulocytes 0.07 0.00 - 0.07 K/uL    Comment: Performed at [facility], [address], [place], [place] [address]  Comprehensive metabolic panel     Status: Abnormal   Collection Time: 03/31/19 12:36 PM   Result Value Ref Range   Sodium 141 135 - 145 mmol/L   Potassium 3.6 3.5 - 5.1 mmol/L   Chloride 112 (H) 98 - 111 mmol/L   CO2 25 22 - 32 mmol/L   Glucose, Bld 161 (H) 70 - 99 mg/dL   BUN 7 (L) 8 - 23 mg/dL   Creatinine, Ser 6.21 0.61 - 1.24 mg/dL   Calcium 8.4 (L) 8.9 - 10.3 mg/dL   Total Protein 5.9 (L) 6.5 - 8.1 g/dL   Albumin 2.2 (L) 3.5 - 5.0 g/dL   AST 21 15 - 41 U/L   ALT 23 0 - 44 U/L   Alkaline Phosphatase 41 38 - 126 U/L   Total Bilirubin 0.7 0.3 - 1.2 mg/dL   GFR calc non Af Amer >60 >60 mL/min   GFR calc Af Amer >60 >60 mL/min   Anion gap 4 (L) 5 - 15    Comment: Performed at [facility], [address], [place], [place] [address]  C-reactive protein     Status: Abnormal   Collection Time: 03/31/19 12:36 PM  Result Value Ref Range   CRP 9.6 (H) <1.0 mg/dL    Comment: Performed at [facility], [address] [place], [place] [address]  D-dimer, quantitative (not at [facility])     Status: Abnormal   Collection Time: 03/31/19 12:36 PM  Result Value Ref Range   D-Dimer, Quant 4.89 (H) 0.00 - 0.50 ug/mL-FEU    Comment: (NOTE) At the manufacturer cut-off of 0.50 ug/mL FEU, this assay has been documented to exclude PE with a sensitivity and negative predictive value of 97 to 99%.  At this time, this assay has not been approved by the FDA to exclude DVT/VTE. Results should be correlated with clinical presentation. Performed at [facility],  [address] [place], [place] [address]   Ferritin     Status: Abnormal   Collection Time: 03/31/19 12:36 PM  Result Value Ref Range   Ferritin 538 (H) 24 - 336 ng/mL    Comment: Performed at [facility], [address], [place], [place] [address]  Magnesium     Status: None   Collection Time: 03/31/19 12:36 PM  Result Value Ref Range   Magnesium 1.9 1.7 - 2.4 mg/dL    Comment: Performed at [facility], [address], [place], [place] [address]  Glucose, capillary     Status: Abnormal    Collection Time: 03/31/19 12:46 PM  Result Value Ref Range   Glucose-Capillary 154 (H) 70 - 99 mg/dL  Glucose, capillary     Status: Abnormal   Collection Time: 03/31/19  4:58 PM  Result Value Ref Range   Glucose-Capillary 131 (H) 70 - 99 mg/dL  Glucose, capillary     Status: Abnormal   Collection Time: 03/31/19  9:19 PM  Result Value Ref Range   Glucose-Capillary 212 (H) 70 - 99 mg/dL  CBC with Differential/Platelet     Status: Abnormal   Collection Time: 04/01/19  5:55 AM  Result Value Ref Range   WBC 7.9 4.0 - 10.5 K/uL   RBC 4.07 (L) 4.22 - 5.81 MIL/uL   Hemoglobin 11.3 (L) 13.0 - 17.0 g/dL   HCT 91.4 (L) 78.2 - 95.6 %   MCV 88.0 80.0 - 100.0 fL   MCH 27.8 26.0 - 34.0 pg   MCHC 31.6 30.0 - 36.0 g/dL   RDW 21.3 08.6 - 57.8 %   Platelets 160 150 - 400 K/uL   nRBC 0.0 0.0 - 0.2 %   Neutrophils Relative % 74 %   Neutro Abs 5.9 1.7 - 7.7 K/uL   Lymphocytes Relative 15 %   Lymphs Abs 1.2 0.7 - 4.0 K/uL   Monocytes Relative 9 %   Monocytes Absolute 0.7 0.1 - 1.0 K/uL   Eosinophils Relative 1 %   Eosinophils Absolute 0.1 0.0 - 0.5 K/uL   Basophils Relative 0 %   Basophils Absolute 0.0 0.0 - 0.1 K/uL   Immature Granulocytes 1 %   Abs Immature Granulocytes 0.06 0.00 - 0.07 K/uL    Comment: Performed at [facility], [address], [place], [place] [address]  Comprehensive metabolic panel     Status: Abnormal   Collection Time: 04/01/19  5:55 AM  Result Value Ref Range   Sodium 142 135 - 145 mmol/L   Potassium 3.4 (L) 3.5 - 5.1 mmol/L   Chloride 111 98 - 111 mmol/L   CO2 24 22 - 32 mmol/L   Glucose, Bld 220 (H) 70 - 99 mg/dL   BUN 10 8 - 23 mg/dL   Creatinine, Ser 9.52 0.61 - 1.24 mg/dL   Calcium 8.4 (L) 8.9 - 10.3 mg/dL   Total Protein 5.3 (L) 6.5 - 8.1 g/dL   Albumin 2.0 (L) 3.5 - 5.0 g/dL   AST 22 15 - 41 U/L   ALT 26 0 - 44 U/L   Alkaline Phosphatase 42 38 - 126 U/L   Total Bilirubin 0.7 0.3 - 1.2 mg/dL   GFR calc non Af Amer 57 (L) >60 mL/min   GFR calc Af  Amer >60 >60 mL/min   Anion gap 7 5 - 15    Comment: Performed at [facility], [address], [place], [place] [address]  C-reactive  protein     Status: Abnormal   Collection Time: 04/01/19  5:55 AM  Result Value Ref Range   CRP 8.4 (H) <1.0 mg/dL    Comment: Performed at [facility], [address], [place], [place] [address]  D-dimer, quantitative (not at [facility])     Status: Abnormal   Collection Time: 04/01/19  5:55 AM  Result Value Ref Range   D-Dimer, Quant 5.28 (H) 0.00 - 0.50 ug/mL-FEU    Comment: (NOTE) At the manufacturer cut-off of 0.50 ug/mL FEU, this assay has been documented to exclude PE with a sensitivity and negative predictive value of 97 to 99%.  At this time, this assay has not been approved by the FDA to exclude DVT/VTE. Results should be correlated with clinical presentation. Performed at [facility], [address], [place], [place] [address]   Ferritin     Status: Abnormal   Collection Time: 04/01/19  5:55 AM  Result Value Ref Range   Ferritin 444 (H) 24 - 336 ng/mL    Comment: Performed at [facility], [address] [place], [place] [address]  Magnesium     Status: None   Collection Time: 04/01/19  5:55 AM  Result Value Ref Range   Magnesium 1.9 1.7 - 2.4 mg/dL    Comment: Performed at [facility], [address], [place], [place] [address]  Glucose, capillary     Status: Abnormal   Collection Time: 04/01/19  8:30 AM  Result Value Ref Range   Glucose-Capillary 186 (H) 70 - 99 mg/dL  Glucose, capillary     Status: Abnormal   Collection Time: 04/01/19 11:21 AM  Result Value Ref Range   Glucose-Capillary 243 (H) 70 - 99 mg/dL  Glucose, capillary     Status: Abnormal   Collection Time: 04/01/19  4:09 PM  Result Value Ref Range   Glucose-Capillary 193 (H) 70 - 99 mg/dL  Glucose, capillary     Status: None   Collection Time: 04/01/19  9:59 PM  Result Value Ref Range   Glucose-Capillary 89 70 - 99 mg/dL  POCT HgB M5H      Status: Abnormal   Collection Time: 04/25/19  3:59 PM  Result Value Ref Range   Hemoglobin A1C 7.2 (A) 4.0 - 5.6 %   HbA1c POC (<> result, manual entry)     HbA1c, POC (prediabetic range)     HbA1c, POC (controlled diabetic range)    UA/M w/rflx Culture, Routine     Status: Abnormal   Collection Time: 06/13/19  2:23 PM   Specimen: Urine   URINE  Result Value Ref Range   Specific Gravity, UA 1.024 1.005 - 1.030   pH, UA 5.0 5.0 - 7.5   Color, UA Yellow Yellow   Appearance Ur Clear Clear   Leukocytes,UA Negative Negative   Protein,UA Negative Negative/Trace   Glucose, UA Negative Negative   Ketones, UA Trace (A) Negative   RBC, UA Negative Negative   Bilirubin, UA Negative Negative   Urobilinogen, Ur 0.2 0.2 - 1.0 mg/dL   Nitrite, UA Negative Negative   Microscopic Examination Comment     Comment: Microscopic follows if indicated.   Microscopic Examination See below:     Comment: Microscopic was indicated and was performed.   Urinalysis Reflex Comment     Comment: This specimen will not reflex to a Urine Culture.  Microscopic Examination     Status: Abnormal   Collection Time: 06/13/19  2:23  PM   URINE  Result Value Ref Range   WBC, UA 0-5 0 - 5 /hpf   RBC 0-2 0 - 2 /hpf   Epithelial Cells (non renal) 0-10 0 - 10 /hpf   Casts Present (A) None seen /lpf   Cast Type Hyaline casts N/A   Mucus, UA Present Not Estab.   Bacteria, UA Few None seen/Few    Assessment/Plan: 1. Encounter for health maintenance examination with abnormal findings Annual health maintenance exam today.   2. Controlled type 2 diabetes mellitus without complication, without long-term current use of insulin (HCC) Blood sugars stable. Continue diabetic medication as prescribed. Check HgbA1c at next routine visit.   3. Essential hypertension Generally stable. Continue bp medication as prescribed.   4. Dementia with behavioral disturbance, unspecified dementia type [facility]) Patient residing in assisted  living facility with memory care unit.   5. Dysuria - UA/M w/rflx Culture, Routine  General Counseling: Marsh verbalizes understanding of the findings of todays visit and agrees with plan of treatment. I have discussed any further diagnostic evaluation that may be needed or ordered today. We also reviewed his medications today. he has been encouraged to call the office with any questions or concerns that should arise related to todays visit.    Counseling:  Diabetes Counseling:  1. Addition of ACE inh/ ARB'S for nephroprotection. Microalbumin is updated  2. Diabetic foot care, prevention of complications. Podiatry consult 3. Exercise and lose weight.  4. Diabetic eye examination, Diabetic eye exam is updated  5. Monitor blood sugar closlely. nutrition counseling.  6. Sign and symptoms of hypoglycemia including shaking sweating,confusion and headaches.  This patient was seen by Vincent Gros FNP Collaboration with Dr Lyndon Code as a part of collaborative care agreement  Orders Placed This Encounter  Procedures  . Microscopic Examination  . UA/M w/rflx Culture, Routine      Total time spent: 45 Minutes  Time spent includes review of chart, medications, test results, and follow up plan with the patient.     Lyndon Code, MD  Internal Medicine

## 2019-06-14 LAB — UA/M W/RFLX CULTURE, ROUTINE
Bilirubin, UA: NEGATIVE
Glucose, UA: NEGATIVE
Leukocytes,UA: NEGATIVE
Nitrite, UA: NEGATIVE
Protein,UA: NEGATIVE
RBC, UA: NEGATIVE
Specific Gravity, UA: 1.024 (ref 1.005–1.030)
Urobilinogen, Ur: 0.2 mg/dL (ref 0.2–1.0)
pH, UA: 5 (ref 5.0–7.5)

## 2019-06-14 LAB — MICROSCOPIC EXAMINATION

## 2019-06-16 DIAGNOSIS — M6281 Muscle weakness (generalized): Secondary | ICD-10-CM | POA: Diagnosis not present

## 2019-06-16 DIAGNOSIS — R2681 Unsteadiness on feet: Secondary | ICD-10-CM | POA: Diagnosis not present

## 2019-06-16 DIAGNOSIS — R293 Abnormal posture: Secondary | ICD-10-CM | POA: Diagnosis not present

## 2019-06-16 DIAGNOSIS — R278 Other lack of coordination: Secondary | ICD-10-CM | POA: Diagnosis not present

## 2019-06-17 ENCOUNTER — Encounter: Payer: Self-pay | Admitting: Nurse Practitioner

## 2019-06-18 DIAGNOSIS — R2681 Unsteadiness on feet: Secondary | ICD-10-CM | POA: Diagnosis not present

## 2019-06-18 DIAGNOSIS — R278 Other lack of coordination: Secondary | ICD-10-CM | POA: Diagnosis not present

## 2019-06-18 DIAGNOSIS — R293 Abnormal posture: Secondary | ICD-10-CM | POA: Diagnosis not present

## 2019-06-18 DIAGNOSIS — M6281 Muscle weakness (generalized): Secondary | ICD-10-CM | POA: Diagnosis not present

## 2019-06-19 DIAGNOSIS — M6281 Muscle weakness (generalized): Secondary | ICD-10-CM | POA: Diagnosis not present

## 2019-06-19 DIAGNOSIS — R2681 Unsteadiness on feet: Secondary | ICD-10-CM | POA: Diagnosis not present

## 2019-06-20 DIAGNOSIS — M6281 Muscle weakness (generalized): Secondary | ICD-10-CM | POA: Diagnosis not present

## 2019-06-20 DIAGNOSIS — R278 Other lack of coordination: Secondary | ICD-10-CM | POA: Diagnosis not present

## 2019-06-20 DIAGNOSIS — R293 Abnormal posture: Secondary | ICD-10-CM | POA: Diagnosis not present

## 2019-06-23 DIAGNOSIS — R2681 Unsteadiness on feet: Secondary | ICD-10-CM | POA: Diagnosis not present

## 2019-06-23 DIAGNOSIS — R293 Abnormal posture: Secondary | ICD-10-CM | POA: Diagnosis not present

## 2019-06-23 DIAGNOSIS — R278 Other lack of coordination: Secondary | ICD-10-CM | POA: Diagnosis not present

## 2019-06-23 DIAGNOSIS — M6281 Muscle weakness (generalized): Secondary | ICD-10-CM | POA: Diagnosis not present

## 2019-09-01 DIAGNOSIS — I10 Essential (primary) hypertension: Secondary | ICD-10-CM | POA: Diagnosis not present

## 2019-09-01 DIAGNOSIS — G301 Alzheimer's disease with late onset: Secondary | ICD-10-CM | POA: Diagnosis not present

## 2019-09-01 DIAGNOSIS — E119 Type 2 diabetes mellitus without complications: Secondary | ICD-10-CM | POA: Diagnosis not present

## 2019-09-01 DIAGNOSIS — U071 COVID-19: Secondary | ICD-10-CM | POA: Diagnosis not present

## 2019-09-17 ENCOUNTER — Telehealth: Payer: Self-pay

## 2019-09-17 NOTE — Telephone Encounter (Signed)
Confirmed and screened for 09-19-19 ov. 

## 2019-09-19 ENCOUNTER — Ambulatory Visit: Payer: Medicare Other | Admitting: Nurse Practitioner

## 2019-09-29 DIAGNOSIS — M79675 Pain in left toe(s): Secondary | ICD-10-CM | POA: Diagnosis not present

## 2019-09-29 DIAGNOSIS — B351 Tinea unguium: Secondary | ICD-10-CM | POA: Diagnosis not present

## 2019-09-29 DIAGNOSIS — M79674 Pain in right toe(s): Secondary | ICD-10-CM | POA: Diagnosis not present

## 2019-10-02 DIAGNOSIS — R2689 Other abnormalities of gait and mobility: Secondary | ICD-10-CM | POA: Diagnosis not present

## 2019-10-02 DIAGNOSIS — R2681 Unsteadiness on feet: Secondary | ICD-10-CM | POA: Diagnosis not present

## 2019-10-03 ENCOUNTER — Telehealth: Payer: Self-pay

## 2019-10-03 ENCOUNTER — Ambulatory Visit: Payer: Medicare Other | Admitting: Nurse Practitioner

## 2019-10-03 DIAGNOSIS — R278 Other lack of coordination: Secondary | ICD-10-CM | POA: Diagnosis not present

## 2019-10-03 DIAGNOSIS — M6281 Muscle weakness (generalized): Secondary | ICD-10-CM | POA: Diagnosis not present

## 2019-10-03 DIAGNOSIS — R293 Abnormal posture: Secondary | ICD-10-CM | POA: Diagnosis not present

## 2019-10-03 NOTE — Telephone Encounter (Signed)
Confirmed and screened for 10-07-19 ov. 

## 2019-10-06 DIAGNOSIS — R293 Abnormal posture: Secondary | ICD-10-CM | POA: Diagnosis not present

## 2019-10-06 DIAGNOSIS — R2689 Other abnormalities of gait and mobility: Secondary | ICD-10-CM | POA: Diagnosis not present

## 2019-10-06 DIAGNOSIS — M6281 Muscle weakness (generalized): Secondary | ICD-10-CM | POA: Diagnosis not present

## 2019-10-06 DIAGNOSIS — R2681 Unsteadiness on feet: Secondary | ICD-10-CM | POA: Diagnosis not present

## 2019-10-06 DIAGNOSIS — R278 Other lack of coordination: Secondary | ICD-10-CM | POA: Diagnosis not present

## 2019-10-07 ENCOUNTER — Ambulatory Visit: Payer: Medicare Other | Admitting: Nurse Practitioner

## 2019-10-16 ENCOUNTER — Telehealth: Payer: Self-pay

## 2019-10-16 NOTE — Telephone Encounter (Signed)
No vm set up unable to lmom for 10-20-19 ov  Reminder.

## 2019-10-20 ENCOUNTER — Encounter: Payer: Self-pay | Admitting: Nurse Practitioner

## 2019-10-20 ENCOUNTER — Other Ambulatory Visit: Payer: Self-pay

## 2019-10-20 ENCOUNTER — Ambulatory Visit (INDEPENDENT_AMBULATORY_CARE_PROVIDER_SITE_OTHER): Payer: Medicare Other | Admitting: Nurse Practitioner

## 2019-10-20 VITALS — BP 158/90 | HR 62 | Temp 97.4°F | Resp 16 | Ht 69.0 in | Wt 166.2 lb

## 2019-10-20 DIAGNOSIS — E119 Type 2 diabetes mellitus without complications: Secondary | ICD-10-CM

## 2019-10-20 DIAGNOSIS — I10 Essential (primary) hypertension: Secondary | ICD-10-CM | POA: Diagnosis not present

## 2019-10-20 DIAGNOSIS — E1165 Type 2 diabetes mellitus with hyperglycemia: Secondary | ICD-10-CM

## 2019-10-20 DIAGNOSIS — F0391 Unspecified dementia with behavioral disturbance: Secondary | ICD-10-CM | POA: Diagnosis not present

## 2019-10-20 DIAGNOSIS — Z86718 Personal history of other venous thrombosis and embolism: Secondary | ICD-10-CM

## 2019-10-20 DIAGNOSIS — Z86711 Personal history of pulmonary embolism: Secondary | ICD-10-CM

## 2019-10-20 LAB — POCT GLYCOSYLATED HEMOGLOBIN (HGB A1C): Hemoglobin A1C: 6.5 % — AB (ref 4.0–5.6)

## 2019-10-20 NOTE — Progress Notes (Signed)
[facility] [address] [place], [place] [address]  Internal MEDICINE  Office Visit Note  Patient Name: Tyler Jimenez  [id number]  [id number]  Date of Service: 10/20/2019  Chief Complaint  Patient presents with   Follow-up    Would like to discuss blood thinner medication   Diabetes   Hypertension   Asthma    The patient is here for routine follow up visit. Blood sugars are well controlled. His HgbA1c is 6.6 today. Blood pressure moderately elevated upon arrival to the office. Did come down as visit progressed. Patient's daughter states that she picked up patient prior to breakfast, he may not have had his morning medications yet.  Patient's daughter is curious how long patient should stay on blood thinners. He currently takes eliquis 5mg  twice daily. He was hospitalized 03/2019 with COVID 19. He developed both DVT and PE. Has been on eliquis ever since. Would like for patient to finish out one year on eliquis then can be discontinued. If he develops another blood clot, whether that be DVT or PE, he would then be on blood thinners for remainder of his life.       Current Medication: Outpatient Encounter Medications as of 10/20/2019  Medication Sig   acetaminophen (TYLENOL) 500 MG tablet Take 500 mg by mouth every 6 (six) hours as needed for mild pain.   amLODipine (NORVASC) 10 MG tablet Take 10 mg by mouth daily.   ELIQUIS 5 MG TABS tablet Take 5 mg by mouth 2 (two) times daily.   glimepiride (AMARYL) 2 MG tablet Take 1 tablet (2 mg total) by mouth daily.   glucose blood (FREESTYLE LITE) test strip 1 each by Other route 2 (two) times daily. diag e11.65   guaifenesin (ROBITUSSIN) 100 MG/5ML syrup Take 200 mg by mouth every 6 (six) hours as needed for cough.   KOMBIGLYZE XR 09-998 MG TB24 Take 1 tablet by mouth daily with supper.   loperamide (IMODIUM) 2 MG capsule Take 2-4 mg by mouth as needed for diarrhea or loose stools.   LORazepam (ATIVAN) 0.5 MG  tablet Take 0.5 mg by mouth every 8 (eight) hours as needed (agitation).   magnesium hydroxide (MILK OF MAGNESIA) 400 MG/5ML suspension Take 30 mLs by mouth daily as needed for mild constipation.   mirtazapine (REMERON) 7.5 MG tablet Take 1 tablet (7.5 mg total) by mouth at bedtime as needed.   Multiple Vitamin (MULTI-VITAMINS) TABS Take 1 tablet by mouth daily.    pioglitazone (ACTOS) 45 MG tablet Take 1 tablet (45 mg total) by mouth daily.   zinc sulfate 220 (50 Zn) MG capsule Take 220 mg by mouth daily.   No facility-administered encounter medications on file as of 10/20/2019.    Surgical History: Past Surgical History:  Procedure Laterality Date   CATARACT EXTRACTION W/PHACO Right 01/05/2015   Procedure: CATARACT EXTRACTION PHACO AND INTRAOCULAR LENS PLACEMENT (IOC);  Surgeon: 01/07/2015, MD;  Location: [facility] ORS;  Service: Ophthalmology;  Laterality: Right;  [place]: 02:33.9 AP%: 29.9 CDE: 46.04 Lot# [place] H   CATARACT EXTRACTION W/PHACO Left 01/19/2015   Procedure: CATARACT EXTRACTION PHACO AND INTRAOCULAR LENS PLACEMENT (IOC);  Surgeon: 01/21/2015, MD;  Location: [facility] ORS;  Service: Ophthalmology;  Laterality: Left;  [place]: 03:59.3 AP%: 28.9 CDE: 69.13 Lot # [place] H   KNEE SURGERY Right     Medical History: Past Medical History:  Diagnosis Date   Asthma    as a child   Cataract    Dementia (HCC)  Diabetes mellitus without complication (HCC)    History of blood clots    HOH (hard of hearing)    Hypertension     Family History: Family History  Problem Relation Age of Onset   Prostate cancer Father    Bladder Cancer Neg Hx    Kidney cancer Neg Hx     Social History   Socioeconomic History   Marital status: Married    Spouse name: Not on file   Number of children: Not on file   Years of education: Not on file   Highest education level: Not on file  Occupational History   Not on file  Tobacco Use   Smoking status: Former Smoker    Smokeless tobacco: Never Used  Building services engineer Use: Never used  Substance and Sexual Activity   Alcohol use: Yes    Alcohol/week: 0.0 standard drinks   Drug use: No   Sexual activity: Not on file  Other Topics Concern   Not on file  Social History Narrative   Not on file   Social Determinants of Health   Financial Resource Strain:    Difficulty of Paying Living Expenses:   Food Insecurity:    Worried About Programme researcher, broadcasting/film/video in the Last Year:    Barista in the Last Year:   Transportation Needs:    Freight forwarder (Medical):    Lack of Transportation (Non-Medical):   Physical Activity:    Days of Exercise per Week:    Minutes of Exercise per Session:   Stress:    Feeling of Stress :   Social Connections:    Frequency of Communication with Friends and Family:    Frequency of Social Gatherings with Friends and Family:    Attends Religious Services:    Active Member of Clubs or Organizations:    Attends Engineer, structural:    Marital Status:   Intimate Partner Violence:    Fear of Current or Ex-Partner:    Emotionally Abused:    Physically Abused:    Sexually Abused:       Review of Systems  Constitutional: Negative for activity change, chills, fatigue and unexpected weight change.  HENT: Negative for congestion, postnasal drip, rhinorrhea, sneezing and sore throat.   Respiratory: Negative for cough, chest tightness, shortness of breath and wheezing.   Cardiovascular: Negative for chest pain and palpitations.       Blood pressure elevated at today's visit. Unclear if he has taken bp medications today.   Gastrointestinal: Negative for abdominal pain, constipation, diarrhea, nausea and vomiting.  Endocrine: Negative for cold intolerance, heat intolerance, polydipsia and polyuria.       Blood sugars doing well   Genitourinary: Negative for dysuria, frequency and urgency.  Musculoskeletal: Negative for  arthralgias, back pain, joint swelling and neck pain.  Skin: Negative for rash.  Allergic/Immunologic: Negative for environmental allergies.  Neurological: Positive for weakness. Negative for tremors and numbness.       Memory issues with worsening dementia. Stable since his last visit .  Hematological: Negative for adenopathy. Does not bruise/bleed easily.  Psychiatric/Behavioral: Negative for behavioral problems (Depression), sleep disturbance and suicidal ideas. The patient is not nervous/anxious.     Today's Vitals   10/20/19 1042  BP: (!) 158/90  Pulse: 62  Resp: 16  Temp: (!) 97.4 F (36.3 C)  SpO2: 100%  Weight: 166 lb 3.2 oz (75.4 kg)  Height: 5\' 9"  (1.753 m)   Body  mass index is 24.54 kg/m.  Physical Exam Vitals and nursing note reviewed.  Constitutional:      General: He is not in acute distress.    Appearance: Normal appearance. He is well-developed. He is not diaphoretic.  HENT:     Head: Normocephalic and atraumatic.     Nose: Nose normal.     Mouth/Throat:     Pharynx: No oropharyngeal exudate.  Eyes:     Extraocular Movements: Extraocular movements intact.     Pupils: Pupils are equal, round, and reactive to light.  Neck:     Thyroid: No thyromegaly.     Vascular: No carotid bruit or JVD.     Trachea: No tracheal deviation.  Cardiovascular:     Rate and Rhythm: Normal rate and regular rhythm.     Heart sounds: Normal heart sounds. No murmur heard.  No friction rub. No gallop.   Pulmonary:     Effort: Pulmonary effort is normal. No respiratory distress.     Breath sounds: Normal breath sounds. No wheezing or rales.  Chest:     Chest wall: No tenderness.  Abdominal:     Palpations: Abdomen is soft.  Musculoskeletal:        General: Normal range of motion.     Cervical back: Normal range of motion and neck supple.  Lymphadenopathy:     Cervical: No cervical adenopathy.  Skin:    General: Skin is warm and dry.  Neurological:     Mental Status: He  is alert and oriented to person, place, and time. Mental status is at baseline.     Cranial Nerves: No cranial nerve deficit.  Psychiatric:        Mood and Affect: Mood is depressed.        Behavior: Behavior normal.        Thought Content: Thought content normal.        Judgment: Judgment normal.   Assessment/Plan: 1. Controlled type 2 diabetes mellitus without complication, without long-term current use of insulin (HCC) - POCT HgB A1C 6.6 today. Continue diabetic medication as prescribed. Refer for diabetic eye exam.  - Ambulatory referral to Ophthalmology  2. Essential hypertension Stable. Continue bp medication as prescribed.   3. History of DVT (deep vein thrombosis) DVT related to COVID 19 back in 03/2019. Recommend continuation of eliquis 5mg  bid for remainder of one year and then will discontinue. Discussed possibility of subsequent clots forming. If this occurs, patient will be on blood thinner for remainder of his life. His daughter did voice understanding.   4. History of pulmonary embolus (PE) PE related to COVID 19 back in 03/2019. Recommend continuation of eliquis 5mg  bid for remainder of one year and then will discontinue. Discussed possibility of subsequent clots forming. If this occurs, patient will be on blood thinner for remainder of his life. His daughter did voice understanding.   5. Dementia with behavioral disturbance, unspecified dementia type [facility]) Patient residing in group facility for adults with demential. Condition is stable. Will continue to monitor.    General Counseling: Zyen verbalizes understanding of the findings of todays visit and agrees with plan of treatment. I have discussed any further diagnostic evaluation that may be needed or ordered today. We also reviewed his medications today. he has been encouraged to call the office with any questions or concerns that should arise related to todays visit.  This patient was seen by Leretha Pol FNP  Collaboration with Dr Lavera Guise as a part of  collaborative care agreement  Orders Placed This Encounter  Procedures   Ambulatory referral to Ophthalmology   POCT HgB A1C     Total time spent: 30 Minutes   Time spent includes review of chart, medications, test results, and follow up plan with the patient.      Dr Lyndon Code Internal medicine

## 2019-11-07 DIAGNOSIS — R293 Abnormal posture: Secondary | ICD-10-CM | POA: Diagnosis not present

## 2019-11-07 DIAGNOSIS — R2681 Unsteadiness on feet: Secondary | ICD-10-CM | POA: Diagnosis not present

## 2019-11-07 DIAGNOSIS — R278 Other lack of coordination: Secondary | ICD-10-CM | POA: Diagnosis not present

## 2019-11-07 DIAGNOSIS — R2689 Other abnormalities of gait and mobility: Secondary | ICD-10-CM | POA: Diagnosis not present

## 2019-11-07 DIAGNOSIS — M6281 Muscle weakness (generalized): Secondary | ICD-10-CM | POA: Diagnosis not present

## 2019-11-10 DIAGNOSIS — R278 Other lack of coordination: Secondary | ICD-10-CM | POA: Diagnosis not present

## 2019-11-10 DIAGNOSIS — R2689 Other abnormalities of gait and mobility: Secondary | ICD-10-CM | POA: Diagnosis not present

## 2019-11-10 DIAGNOSIS — R293 Abnormal posture: Secondary | ICD-10-CM | POA: Diagnosis not present

## 2019-11-10 DIAGNOSIS — R2681 Unsteadiness on feet: Secondary | ICD-10-CM | POA: Diagnosis not present

## 2019-11-10 DIAGNOSIS — M6281 Muscle weakness (generalized): Secondary | ICD-10-CM | POA: Diagnosis not present

## 2019-11-11 DIAGNOSIS — R2681 Unsteadiness on feet: Secondary | ICD-10-CM | POA: Diagnosis not present

## 2019-11-11 DIAGNOSIS — R2689 Other abnormalities of gait and mobility: Secondary | ICD-10-CM | POA: Diagnosis not present

## 2019-11-12 DIAGNOSIS — M6281 Muscle weakness (generalized): Secondary | ICD-10-CM | POA: Diagnosis not present

## 2019-11-12 DIAGNOSIS — R293 Abnormal posture: Secondary | ICD-10-CM | POA: Diagnosis not present

## 2019-11-12 DIAGNOSIS — R278 Other lack of coordination: Secondary | ICD-10-CM | POA: Diagnosis not present

## 2019-11-14 DIAGNOSIS — M6281 Muscle weakness (generalized): Secondary | ICD-10-CM | POA: Diagnosis not present

## 2019-11-14 DIAGNOSIS — R2681 Unsteadiness on feet: Secondary | ICD-10-CM | POA: Diagnosis not present

## 2019-11-14 DIAGNOSIS — R2689 Other abnormalities of gait and mobility: Secondary | ICD-10-CM | POA: Diagnosis not present

## 2019-11-14 DIAGNOSIS — R293 Abnormal posture: Secondary | ICD-10-CM | POA: Diagnosis not present

## 2019-11-14 DIAGNOSIS — R278 Other lack of coordination: Secondary | ICD-10-CM | POA: Diagnosis not present

## 2019-11-17 DIAGNOSIS — R2689 Other abnormalities of gait and mobility: Secondary | ICD-10-CM | POA: Diagnosis not present

## 2019-11-17 DIAGNOSIS — R278 Other lack of coordination: Secondary | ICD-10-CM | POA: Diagnosis not present

## 2019-11-17 DIAGNOSIS — R2681 Unsteadiness on feet: Secondary | ICD-10-CM | POA: Diagnosis not present

## 2019-11-17 DIAGNOSIS — R293 Abnormal posture: Secondary | ICD-10-CM | POA: Diagnosis not present

## 2019-11-17 DIAGNOSIS — M6281 Muscle weakness (generalized): Secondary | ICD-10-CM | POA: Diagnosis not present

## 2019-11-19 DIAGNOSIS — R278 Other lack of coordination: Secondary | ICD-10-CM | POA: Diagnosis not present

## 2019-11-19 DIAGNOSIS — R2681 Unsteadiness on feet: Secondary | ICD-10-CM | POA: Diagnosis not present

## 2019-11-19 DIAGNOSIS — R2689 Other abnormalities of gait and mobility: Secondary | ICD-10-CM | POA: Diagnosis not present

## 2019-11-19 DIAGNOSIS — M6281 Muscle weakness (generalized): Secondary | ICD-10-CM | POA: Diagnosis not present

## 2019-11-19 DIAGNOSIS — R293 Abnormal posture: Secondary | ICD-10-CM | POA: Diagnosis not present

## 2019-11-20 DIAGNOSIS — R2689 Other abnormalities of gait and mobility: Secondary | ICD-10-CM | POA: Diagnosis not present

## 2019-11-20 DIAGNOSIS — R2681 Unsteadiness on feet: Secondary | ICD-10-CM | POA: Diagnosis not present

## 2019-11-21 DIAGNOSIS — R293 Abnormal posture: Secondary | ICD-10-CM | POA: Diagnosis not present

## 2019-11-21 DIAGNOSIS — M6281 Muscle weakness (generalized): Secondary | ICD-10-CM | POA: Diagnosis not present

## 2019-11-21 DIAGNOSIS — R278 Other lack of coordination: Secondary | ICD-10-CM | POA: Diagnosis not present

## 2019-11-24 DIAGNOSIS — R2689 Other abnormalities of gait and mobility: Secondary | ICD-10-CM | POA: Diagnosis not present

## 2019-11-24 DIAGNOSIS — M6281 Muscle weakness (generalized): Secondary | ICD-10-CM | POA: Diagnosis not present

## 2019-11-24 DIAGNOSIS — R278 Other lack of coordination: Secondary | ICD-10-CM | POA: Diagnosis not present

## 2019-11-24 DIAGNOSIS — R2681 Unsteadiness on feet: Secondary | ICD-10-CM | POA: Diagnosis not present

## 2019-11-24 DIAGNOSIS — R293 Abnormal posture: Secondary | ICD-10-CM | POA: Diagnosis not present

## 2019-11-26 DIAGNOSIS — R2689 Other abnormalities of gait and mobility: Secondary | ICD-10-CM | POA: Diagnosis not present

## 2019-11-26 DIAGNOSIS — R293 Abnormal posture: Secondary | ICD-10-CM | POA: Diagnosis not present

## 2019-11-26 DIAGNOSIS — R278 Other lack of coordination: Secondary | ICD-10-CM | POA: Diagnosis not present

## 2019-11-26 DIAGNOSIS — R2681 Unsteadiness on feet: Secondary | ICD-10-CM | POA: Diagnosis not present

## 2019-11-26 DIAGNOSIS — M6281 Muscle weakness (generalized): Secondary | ICD-10-CM | POA: Diagnosis not present

## 2019-11-28 DIAGNOSIS — R2681 Unsteadiness on feet: Secondary | ICD-10-CM | POA: Diagnosis not present

## 2019-11-28 DIAGNOSIS — R2689 Other abnormalities of gait and mobility: Secondary | ICD-10-CM | POA: Diagnosis not present

## 2019-11-28 DIAGNOSIS — M6281 Muscle weakness (generalized): Secondary | ICD-10-CM | POA: Diagnosis not present

## 2019-11-28 DIAGNOSIS — R278 Other lack of coordination: Secondary | ICD-10-CM | POA: Diagnosis not present

## 2019-11-28 DIAGNOSIS — R293 Abnormal posture: Secondary | ICD-10-CM | POA: Diagnosis not present

## 2019-12-01 DIAGNOSIS — R2689 Other abnormalities of gait and mobility: Secondary | ICD-10-CM | POA: Diagnosis not present

## 2019-12-01 DIAGNOSIS — R293 Abnormal posture: Secondary | ICD-10-CM | POA: Diagnosis not present

## 2019-12-01 DIAGNOSIS — M6281 Muscle weakness (generalized): Secondary | ICD-10-CM | POA: Diagnosis not present

## 2019-12-01 DIAGNOSIS — R278 Other lack of coordination: Secondary | ICD-10-CM | POA: Diagnosis not present

## 2019-12-01 DIAGNOSIS — R2681 Unsteadiness on feet: Secondary | ICD-10-CM | POA: Diagnosis not present

## 2019-12-03 DIAGNOSIS — M6281 Muscle weakness (generalized): Secondary | ICD-10-CM | POA: Diagnosis not present

## 2019-12-03 DIAGNOSIS — R293 Abnormal posture: Secondary | ICD-10-CM | POA: Diagnosis not present

## 2019-12-03 DIAGNOSIS — R2689 Other abnormalities of gait and mobility: Secondary | ICD-10-CM | POA: Diagnosis not present

## 2019-12-03 DIAGNOSIS — R2681 Unsteadiness on feet: Secondary | ICD-10-CM | POA: Diagnosis not present

## 2019-12-03 DIAGNOSIS — R278 Other lack of coordination: Secondary | ICD-10-CM | POA: Diagnosis not present

## 2019-12-05 DIAGNOSIS — R293 Abnormal posture: Secondary | ICD-10-CM | POA: Diagnosis not present

## 2019-12-05 DIAGNOSIS — M6281 Muscle weakness (generalized): Secondary | ICD-10-CM | POA: Diagnosis not present

## 2019-12-05 DIAGNOSIS — R2681 Unsteadiness on feet: Secondary | ICD-10-CM | POA: Diagnosis not present

## 2019-12-05 DIAGNOSIS — R2689 Other abnormalities of gait and mobility: Secondary | ICD-10-CM | POA: Diagnosis not present

## 2019-12-05 DIAGNOSIS — R278 Other lack of coordination: Secondary | ICD-10-CM | POA: Diagnosis not present

## 2019-12-08 DIAGNOSIS — R293 Abnormal posture: Secondary | ICD-10-CM | POA: Diagnosis not present

## 2019-12-08 DIAGNOSIS — E119 Type 2 diabetes mellitus without complications: Secondary | ICD-10-CM | POA: Diagnosis not present

## 2019-12-08 DIAGNOSIS — M6281 Muscle weakness (generalized): Secondary | ICD-10-CM | POA: Diagnosis not present

## 2019-12-08 DIAGNOSIS — G301 Alzheimer's disease with late onset: Secondary | ICD-10-CM | POA: Diagnosis not present

## 2019-12-08 DIAGNOSIS — F0280 Dementia in other diseases classified elsewhere without behavioral disturbance: Secondary | ICD-10-CM | POA: Diagnosis not present

## 2019-12-08 DIAGNOSIS — R2681 Unsteadiness on feet: Secondary | ICD-10-CM | POA: Diagnosis not present

## 2019-12-08 DIAGNOSIS — I10 Essential (primary) hypertension: Secondary | ICD-10-CM | POA: Diagnosis not present

## 2019-12-08 DIAGNOSIS — R278 Other lack of coordination: Secondary | ICD-10-CM | POA: Diagnosis not present

## 2019-12-08 DIAGNOSIS — R2689 Other abnormalities of gait and mobility: Secondary | ICD-10-CM | POA: Diagnosis not present

## 2019-12-09 DIAGNOSIS — M79675 Pain in left toe(s): Secondary | ICD-10-CM | POA: Diagnosis not present

## 2019-12-09 DIAGNOSIS — B351 Tinea unguium: Secondary | ICD-10-CM | POA: Diagnosis not present

## 2019-12-09 DIAGNOSIS — M79674 Pain in right toe(s): Secondary | ICD-10-CM | POA: Diagnosis not present

## 2019-12-10 DIAGNOSIS — R2681 Unsteadiness on feet: Secondary | ICD-10-CM | POA: Diagnosis not present

## 2019-12-10 DIAGNOSIS — R293 Abnormal posture: Secondary | ICD-10-CM | POA: Diagnosis not present

## 2019-12-10 DIAGNOSIS — M6281 Muscle weakness (generalized): Secondary | ICD-10-CM | POA: Diagnosis not present

## 2019-12-10 DIAGNOSIS — R2689 Other abnormalities of gait and mobility: Secondary | ICD-10-CM | POA: Diagnosis not present

## 2019-12-10 DIAGNOSIS — R278 Other lack of coordination: Secondary | ICD-10-CM | POA: Diagnosis not present

## 2019-12-12 DIAGNOSIS — R2689 Other abnormalities of gait and mobility: Secondary | ICD-10-CM | POA: Diagnosis not present

## 2019-12-12 DIAGNOSIS — M6281 Muscle weakness (generalized): Secondary | ICD-10-CM | POA: Diagnosis not present

## 2019-12-12 DIAGNOSIS — R278 Other lack of coordination: Secondary | ICD-10-CM | POA: Diagnosis not present

## 2019-12-12 DIAGNOSIS — R293 Abnormal posture: Secondary | ICD-10-CM | POA: Diagnosis not present

## 2019-12-12 DIAGNOSIS — R2681 Unsteadiness on feet: Secondary | ICD-10-CM | POA: Diagnosis not present

## 2019-12-15 DIAGNOSIS — R2689 Other abnormalities of gait and mobility: Secondary | ICD-10-CM | POA: Diagnosis not present

## 2019-12-15 DIAGNOSIS — R293 Abnormal posture: Secondary | ICD-10-CM | POA: Diagnosis not present

## 2019-12-15 DIAGNOSIS — M6281 Muscle weakness (generalized): Secondary | ICD-10-CM | POA: Diagnosis not present

## 2019-12-15 DIAGNOSIS — R2681 Unsteadiness on feet: Secondary | ICD-10-CM | POA: Diagnosis not present

## 2019-12-15 DIAGNOSIS — R278 Other lack of coordination: Secondary | ICD-10-CM | POA: Diagnosis not present

## 2019-12-17 DIAGNOSIS — R2681 Unsteadiness on feet: Secondary | ICD-10-CM | POA: Diagnosis not present

## 2019-12-17 DIAGNOSIS — R278 Other lack of coordination: Secondary | ICD-10-CM | POA: Diagnosis not present

## 2019-12-17 DIAGNOSIS — M6281 Muscle weakness (generalized): Secondary | ICD-10-CM | POA: Diagnosis not present

## 2019-12-17 DIAGNOSIS — R2689 Other abnormalities of gait and mobility: Secondary | ICD-10-CM | POA: Diagnosis not present

## 2019-12-17 DIAGNOSIS — R293 Abnormal posture: Secondary | ICD-10-CM | POA: Diagnosis not present

## 2019-12-19 DIAGNOSIS — R278 Other lack of coordination: Secondary | ICD-10-CM | POA: Diagnosis not present

## 2019-12-19 DIAGNOSIS — M6281 Muscle weakness (generalized): Secondary | ICD-10-CM | POA: Diagnosis not present

## 2019-12-19 DIAGNOSIS — R2681 Unsteadiness on feet: Secondary | ICD-10-CM | POA: Diagnosis not present

## 2019-12-19 DIAGNOSIS — R293 Abnormal posture: Secondary | ICD-10-CM | POA: Diagnosis not present

## 2019-12-19 DIAGNOSIS — R2689 Other abnormalities of gait and mobility: Secondary | ICD-10-CM | POA: Diagnosis not present

## 2019-12-22 DIAGNOSIS — M6281 Muscle weakness (generalized): Secondary | ICD-10-CM | POA: Diagnosis not present

## 2019-12-22 DIAGNOSIS — R278 Other lack of coordination: Secondary | ICD-10-CM | POA: Diagnosis not present

## 2019-12-22 DIAGNOSIS — R293 Abnormal posture: Secondary | ICD-10-CM | POA: Diagnosis not present

## 2019-12-22 DIAGNOSIS — R2689 Other abnormalities of gait and mobility: Secondary | ICD-10-CM | POA: Diagnosis not present

## 2019-12-22 DIAGNOSIS — R2681 Unsteadiness on feet: Secondary | ICD-10-CM | POA: Diagnosis not present

## 2019-12-24 DIAGNOSIS — M6281 Muscle weakness (generalized): Secondary | ICD-10-CM | POA: Diagnosis not present

## 2019-12-24 DIAGNOSIS — R293 Abnormal posture: Secondary | ICD-10-CM | POA: Diagnosis not present

## 2019-12-24 DIAGNOSIS — R2681 Unsteadiness on feet: Secondary | ICD-10-CM | POA: Diagnosis not present

## 2019-12-24 DIAGNOSIS — R278 Other lack of coordination: Secondary | ICD-10-CM | POA: Diagnosis not present

## 2019-12-24 DIAGNOSIS — R2689 Other abnormalities of gait and mobility: Secondary | ICD-10-CM | POA: Diagnosis not present

## 2019-12-26 DIAGNOSIS — R2681 Unsteadiness on feet: Secondary | ICD-10-CM | POA: Diagnosis not present

## 2019-12-26 DIAGNOSIS — R278 Other lack of coordination: Secondary | ICD-10-CM | POA: Diagnosis not present

## 2019-12-26 DIAGNOSIS — R293 Abnormal posture: Secondary | ICD-10-CM | POA: Diagnosis not present

## 2019-12-26 DIAGNOSIS — R2689 Other abnormalities of gait and mobility: Secondary | ICD-10-CM | POA: Diagnosis not present

## 2019-12-26 DIAGNOSIS — M6281 Muscle weakness (generalized): Secondary | ICD-10-CM | POA: Diagnosis not present

## 2019-12-29 DIAGNOSIS — R2681 Unsteadiness on feet: Secondary | ICD-10-CM | POA: Diagnosis not present

## 2019-12-29 DIAGNOSIS — R2689 Other abnormalities of gait and mobility: Secondary | ICD-10-CM | POA: Diagnosis not present

## 2019-12-29 DIAGNOSIS — M6281 Muscle weakness (generalized): Secondary | ICD-10-CM | POA: Diagnosis not present

## 2019-12-29 DIAGNOSIS — R278 Other lack of coordination: Secondary | ICD-10-CM | POA: Diagnosis not present

## 2019-12-29 DIAGNOSIS — R293 Abnormal posture: Secondary | ICD-10-CM | POA: Diagnosis not present

## 2019-12-31 DIAGNOSIS — R2681 Unsteadiness on feet: Secondary | ICD-10-CM | POA: Diagnosis not present

## 2019-12-31 DIAGNOSIS — R293 Abnormal posture: Secondary | ICD-10-CM | POA: Diagnosis not present

## 2019-12-31 DIAGNOSIS — M6281 Muscle weakness (generalized): Secondary | ICD-10-CM | POA: Diagnosis not present

## 2019-12-31 DIAGNOSIS — R2689 Other abnormalities of gait and mobility: Secondary | ICD-10-CM | POA: Diagnosis not present

## 2019-12-31 DIAGNOSIS — R278 Other lack of coordination: Secondary | ICD-10-CM | POA: Diagnosis not present

## 2020-01-02 DIAGNOSIS — R278 Other lack of coordination: Secondary | ICD-10-CM | POA: Diagnosis not present

## 2020-01-02 DIAGNOSIS — R293 Abnormal posture: Secondary | ICD-10-CM | POA: Diagnosis not present

## 2020-01-02 DIAGNOSIS — R2689 Other abnormalities of gait and mobility: Secondary | ICD-10-CM | POA: Diagnosis not present

## 2020-01-02 DIAGNOSIS — M6281 Muscle weakness (generalized): Secondary | ICD-10-CM | POA: Diagnosis not present

## 2020-01-02 DIAGNOSIS — R2681 Unsteadiness on feet: Secondary | ICD-10-CM | POA: Diagnosis not present

## 2020-01-05 DIAGNOSIS — M6281 Muscle weakness (generalized): Secondary | ICD-10-CM | POA: Diagnosis not present

## 2020-01-05 DIAGNOSIS — R2689 Other abnormalities of gait and mobility: Secondary | ICD-10-CM | POA: Diagnosis not present

## 2020-01-05 DIAGNOSIS — R278 Other lack of coordination: Secondary | ICD-10-CM | POA: Diagnosis not present

## 2020-01-05 DIAGNOSIS — R2681 Unsteadiness on feet: Secondary | ICD-10-CM | POA: Diagnosis not present

## 2020-01-05 DIAGNOSIS — R293 Abnormal posture: Secondary | ICD-10-CM | POA: Diagnosis not present

## 2020-01-07 DIAGNOSIS — R293 Abnormal posture: Secondary | ICD-10-CM | POA: Diagnosis not present

## 2020-01-07 DIAGNOSIS — R278 Other lack of coordination: Secondary | ICD-10-CM | POA: Diagnosis not present

## 2020-01-07 DIAGNOSIS — M6281 Muscle weakness (generalized): Secondary | ICD-10-CM | POA: Diagnosis not present

## 2020-01-12 DIAGNOSIS — R293 Abnormal posture: Secondary | ICD-10-CM | POA: Diagnosis not present

## 2020-01-12 DIAGNOSIS — R278 Other lack of coordination: Secondary | ICD-10-CM | POA: Diagnosis not present

## 2020-01-12 DIAGNOSIS — M6281 Muscle weakness (generalized): Secondary | ICD-10-CM | POA: Diagnosis not present

## 2020-01-13 DIAGNOSIS — E113291 Type 2 diabetes mellitus with mild nonproliferative diabetic retinopathy without macular edema, right eye: Secondary | ICD-10-CM | POA: Diagnosis not present

## 2020-01-13 DIAGNOSIS — F0391 Unspecified dementia with behavioral disturbance: Secondary | ICD-10-CM | POA: Diagnosis not present

## 2020-01-13 DIAGNOSIS — R278 Other lack of coordination: Secondary | ICD-10-CM | POA: Diagnosis not present

## 2020-01-13 DIAGNOSIS — Z86718 Personal history of other venous thrombosis and embolism: Secondary | ICD-10-CM | POA: Diagnosis not present

## 2020-01-13 DIAGNOSIS — E119 Type 2 diabetes mellitus without complications: Secondary | ICD-10-CM | POA: Diagnosis not present

## 2020-01-13 DIAGNOSIS — H40003 Preglaucoma, unspecified, bilateral: Secondary | ICD-10-CM | POA: Diagnosis not present

## 2020-01-13 DIAGNOSIS — R293 Abnormal posture: Secondary | ICD-10-CM | POA: Diagnosis not present

## 2020-01-13 DIAGNOSIS — M6281 Muscle weakness (generalized): Secondary | ICD-10-CM | POA: Diagnosis not present

## 2020-01-13 DIAGNOSIS — I10 Essential (primary) hypertension: Secondary | ICD-10-CM | POA: Diagnosis not present

## 2020-01-16 DIAGNOSIS — M6281 Muscle weakness (generalized): Secondary | ICD-10-CM | POA: Diagnosis not present

## 2020-01-16 DIAGNOSIS — R293 Abnormal posture: Secondary | ICD-10-CM | POA: Diagnosis not present

## 2020-01-16 DIAGNOSIS — R278 Other lack of coordination: Secondary | ICD-10-CM | POA: Diagnosis not present

## 2020-01-21 DIAGNOSIS — R293 Abnormal posture: Secondary | ICD-10-CM | POA: Diagnosis not present

## 2020-01-21 DIAGNOSIS — R278 Other lack of coordination: Secondary | ICD-10-CM | POA: Diagnosis not present

## 2020-01-21 DIAGNOSIS — M6281 Muscle weakness (generalized): Secondary | ICD-10-CM | POA: Diagnosis not present

## 2020-01-27 DIAGNOSIS — R293 Abnormal posture: Secondary | ICD-10-CM | POA: Diagnosis not present

## 2020-01-27 DIAGNOSIS — M6281 Muscle weakness (generalized): Secondary | ICD-10-CM | POA: Diagnosis not present

## 2020-01-27 DIAGNOSIS — R278 Other lack of coordination: Secondary | ICD-10-CM | POA: Diagnosis not present

## 2020-01-28 DIAGNOSIS — M6281 Muscle weakness (generalized): Secondary | ICD-10-CM | POA: Diagnosis not present

## 2020-01-28 DIAGNOSIS — R293 Abnormal posture: Secondary | ICD-10-CM | POA: Diagnosis not present

## 2020-01-28 DIAGNOSIS — R278 Other lack of coordination: Secondary | ICD-10-CM | POA: Diagnosis not present

## 2020-02-02 DIAGNOSIS — R278 Other lack of coordination: Secondary | ICD-10-CM | POA: Diagnosis not present

## 2020-02-02 DIAGNOSIS — R293 Abnormal posture: Secondary | ICD-10-CM | POA: Diagnosis not present

## 2020-02-02 DIAGNOSIS — M6281 Muscle weakness (generalized): Secondary | ICD-10-CM | POA: Diagnosis not present

## 2020-02-10 DIAGNOSIS — Z20828 Contact with and (suspected) exposure to other viral communicable diseases: Secondary | ICD-10-CM | POA: Diagnosis not present

## 2020-02-18 DIAGNOSIS — M6281 Muscle weakness (generalized): Secondary | ICD-10-CM | POA: Diagnosis not present

## 2020-02-18 DIAGNOSIS — R278 Other lack of coordination: Secondary | ICD-10-CM | POA: Diagnosis not present

## 2020-02-18 DIAGNOSIS — R293 Abnormal posture: Secondary | ICD-10-CM | POA: Diagnosis not present

## 2020-02-19 DIAGNOSIS — Z20828 Contact with and (suspected) exposure to other viral communicable diseases: Secondary | ICD-10-CM | POA: Diagnosis not present

## 2020-02-23 DIAGNOSIS — R293 Abnormal posture: Secondary | ICD-10-CM | POA: Diagnosis not present

## 2020-02-23 DIAGNOSIS — M6281 Muscle weakness (generalized): Secondary | ICD-10-CM | POA: Diagnosis not present

## 2020-02-23 DIAGNOSIS — R278 Other lack of coordination: Secondary | ICD-10-CM | POA: Diagnosis not present

## 2020-03-01 DIAGNOSIS — R293 Abnormal posture: Secondary | ICD-10-CM | POA: Diagnosis not present

## 2020-03-01 DIAGNOSIS — M6281 Muscle weakness (generalized): Secondary | ICD-10-CM | POA: Diagnosis not present

## 2020-03-01 DIAGNOSIS — R278 Other lack of coordination: Secondary | ICD-10-CM | POA: Diagnosis not present

## 2020-03-01 IMAGING — CT CT ANGIO CHEST
2 of 6 series · 17 of 46 positions shown · IV contrast (omnipaque)
Comparison: None.
COMPARISON: None.

Addendum:
CLINICAL DATA: Elevated D-dimer.

EXAM:
CT ANGIOGRAPHY CHEST WITH CONTRAST
TECHNIQUE: Multidetector CT imaging of the chest was performed using the
standard protocol during bolus administration of intravenous
contrast. Multiplanar CT image reconstructions and MIPs were
obtained to evaluate the vascular anatomy.
CONTRAST:  80mL OMNIPAQUE IOHEXOL 350 MG/ML SOLN

[Series 6: thins · axial · 0.78mm/px · z∈[-231,+16]mm · 14 of 271 slices shown]
[im 12/271  lung]
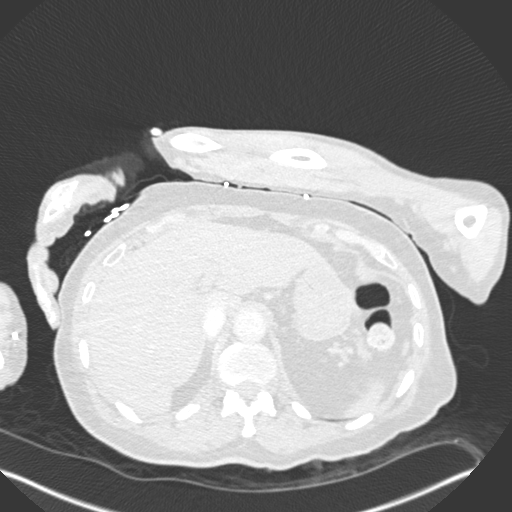
[im 36/271  soft-tissue]
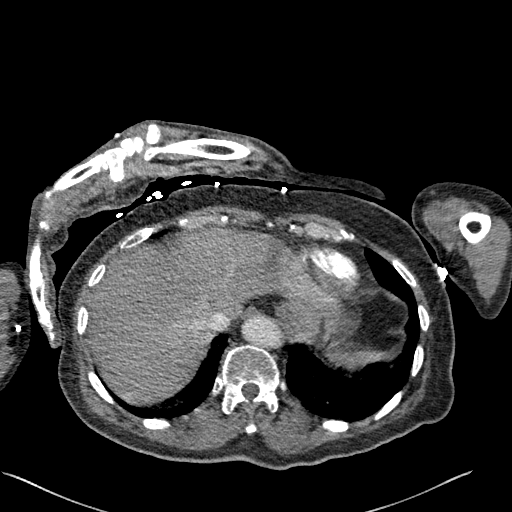
[im 47/271  lung]
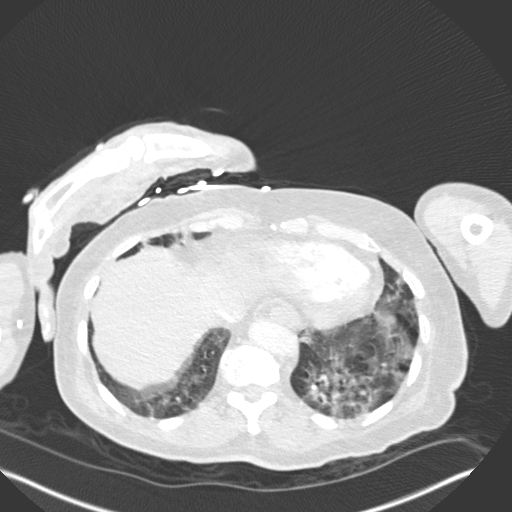
[im 71/271  soft-tissue]
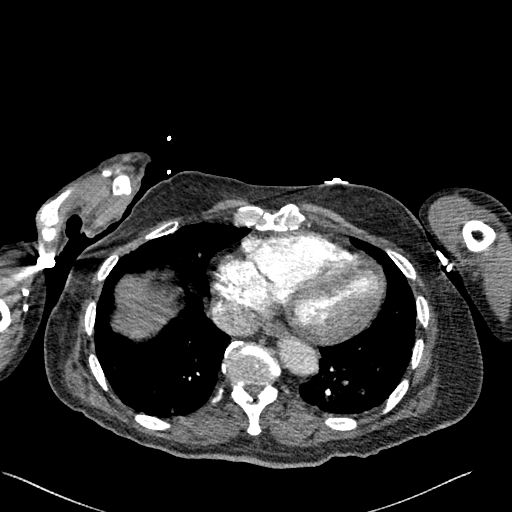
[im 94/271  lung]
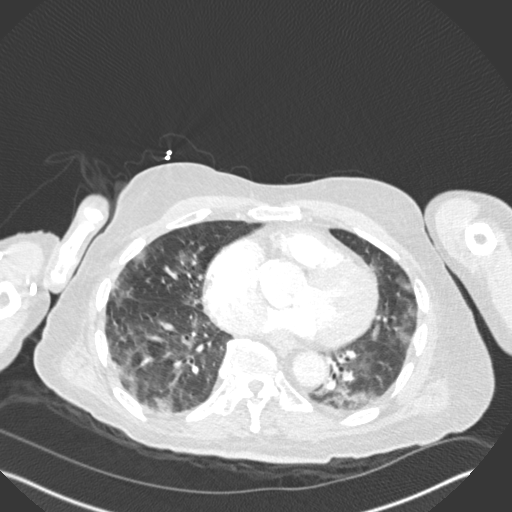
[im 106/271  soft-tissue]
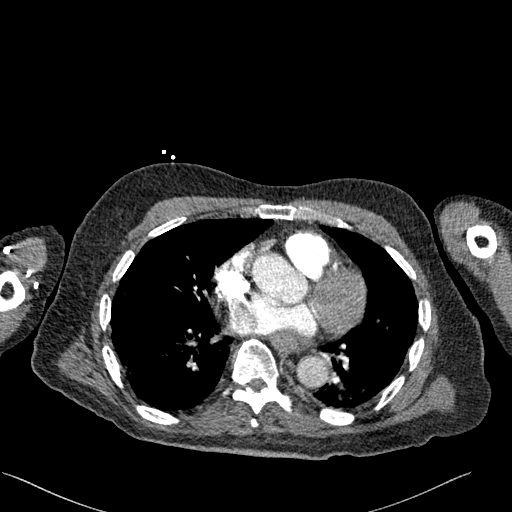
[im 130/271  lung]
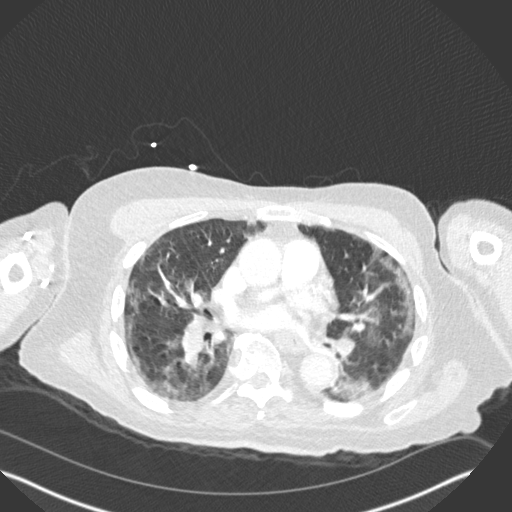
[im 141/271  soft-tissue]
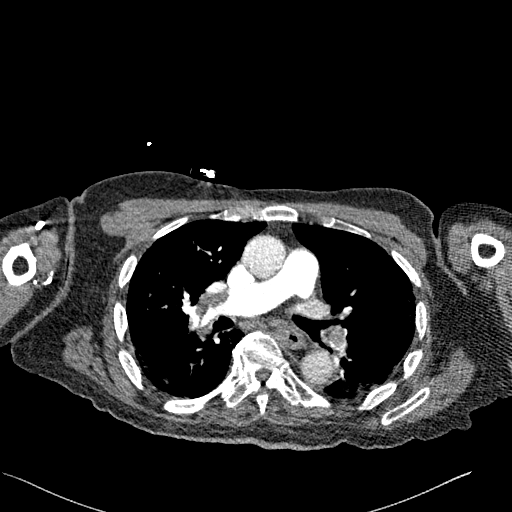
[im 165/271  lung]
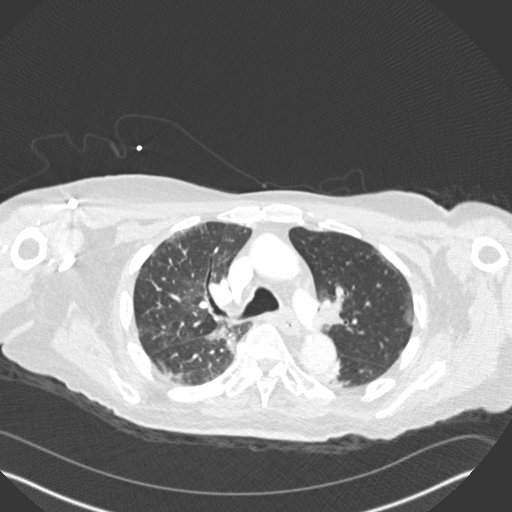
[im 177/271  soft-tissue]
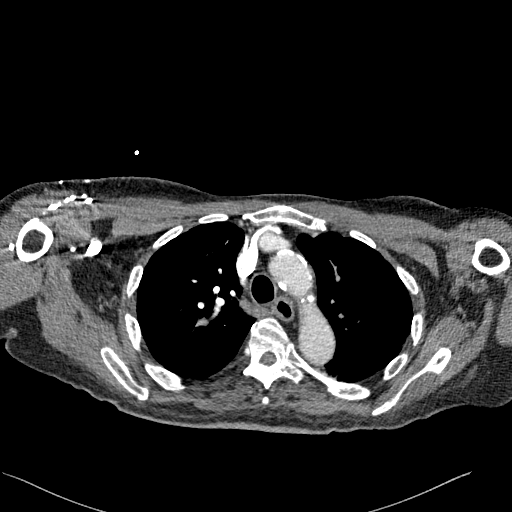
[im 200/271  lung]
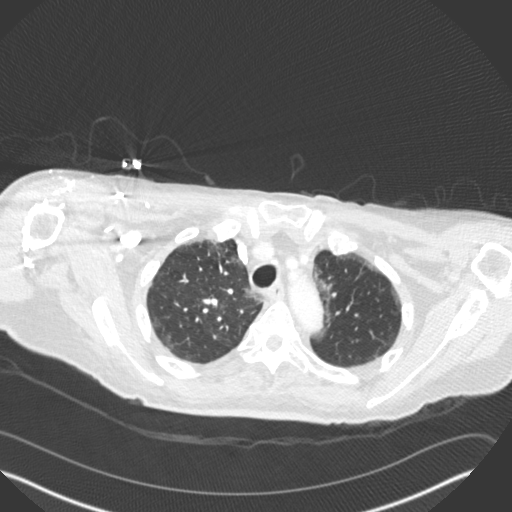
[im 224/271  soft-tissue]
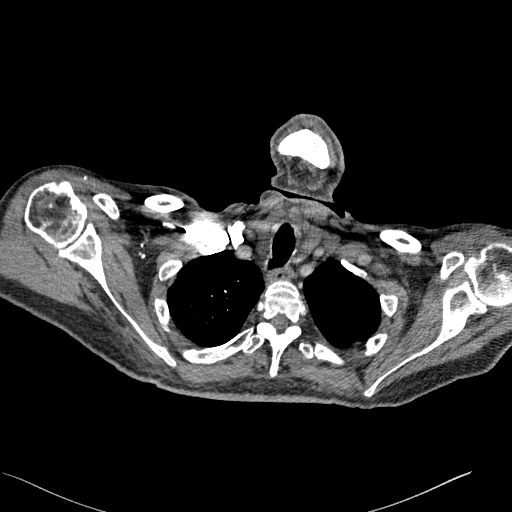
[im 235/271  lung]
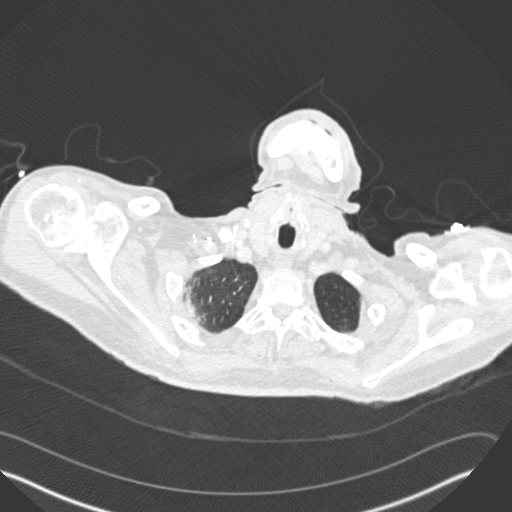
[im 259/271  soft-tissue]
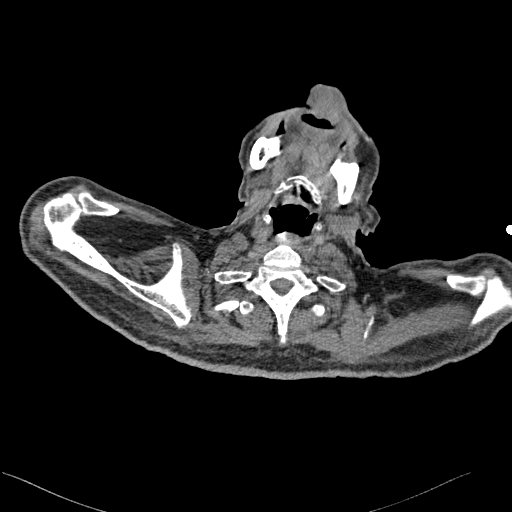

[Series 8: coronal mpr · coronal · 0.57mm/px · 3 of 117 slices shown]
[im 30/117  soft-tissue]
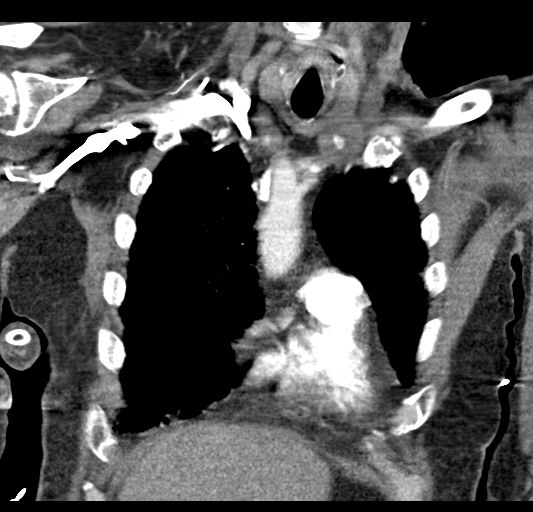
[im 59/117  soft-tissue]
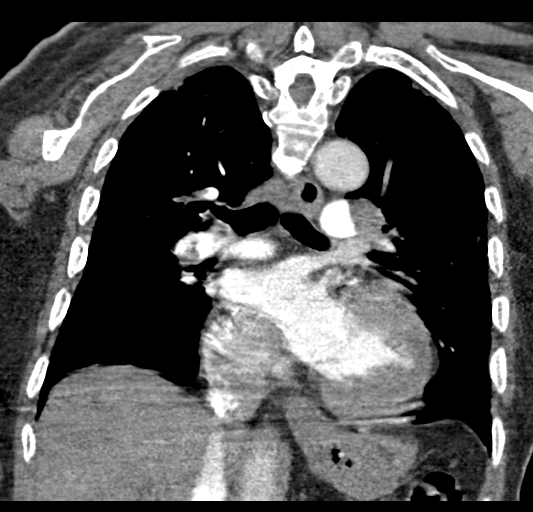
[im 88/117  soft-tissue]
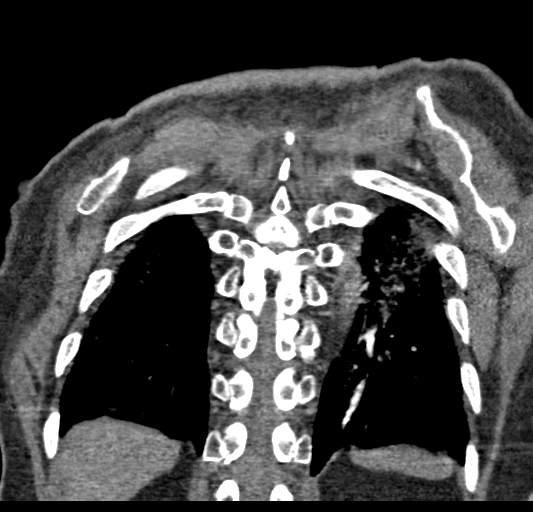

[17 of 46 positions shown; findings below may reference images not displayed]

FINDINGS: Cardiovascular: There are bilateral pulmonary emboli noted with
large volume, most pronounced in the lower lobes bilaterally, but
also seen in the upper lobes. Evidence of right heart strain with
RV: LV ratio of 1.36. Coronary artery and aortic calcifications. No
evidence of aortic aneurysm.

Mediastinum/Nodes: No mediastinal, hilar, or axillary adenopathy.
Trachea and esophagus are unremarkable. Thyroid unremarkable.

Lungs/Pleura: Peripheral ground-glass airspace opacities throughout
both lungs. While this may be related to the bilateral pulmonary
emboli, infection cannot be excluded, particularly atypical/viral
infection. No effusions.

Upper Abdomen: Imaging into the upper abdomen shows no acute
findings.

Musculoskeletal: Chest wall soft tissues are unremarkable. No acute
bony abnormality.

Review of the MIP images confirms the above findings.
IMPRESSION: Large volume bilateral pulmonary emboli. CT evidence of right heart
strain (RV/LV Ratio = 1.36) consistent with at least submassive
(intermediate risk) PE. The presence of right heart strain has been
associated with an increased risk of morbidity and mortality. Please
activate Code PE by paging [phone].

Peripheral ground-glass airspace opacities within the lungs
bilaterally. While this may be related to changes from bilateral
pulmonary emboli, cannot exclude infection, particularly
atypical/viral infection. Recommend correlation with COVID status.

ADDENDUM:
Critical Value/emergent results were called by telephone at the time
of interpretation on 03/26/2019 at [DATE] to Blain Jumper, who
verbally acknowledged these results.

*** End of Addendum ***
FINDINGS: Cardiovascular: There are bilateral pulmonary emboli noted with
large volume, most pronounced in the lower lobes bilaterally, but
also seen in the upper lobes. Evidence of right heart strain with
RV: LV ratio of 1.36. Coronary artery and aortic calcifications. No
evidence of aortic aneurysm.

Mediastinum/Nodes: No mediastinal, hilar, or axillary adenopathy.
Trachea and esophagus are unremarkable. Thyroid unremarkable.

Lungs/Pleura: Peripheral ground-glass airspace opacities throughout
both lungs. While this may be related to the bilateral pulmonary
emboli, infection cannot be excluded, particularly atypical/viral
infection. No effusions.

Upper Abdomen: Imaging into the upper abdomen shows no acute
findings.

Musculoskeletal: Chest wall soft tissues are unremarkable. No acute
bony abnormality.

Review of the MIP images confirms the above findings.
IMPRESSION: Large volume bilateral pulmonary emboli. CT evidence of right heart
strain (RV/LV Ratio = 1.36) consistent with at least submassive
(intermediate risk) PE. The presence of right heart strain has been
associated with an increased risk of morbidity and mortality. Please
activate Code PE by paging [phone].

Peripheral ground-glass airspace opacities within the lungs
bilaterally. While this may be related to changes from bilateral
pulmonary emboli, cannot exclude infection, particularly
atypical/viral infection. Recommend correlation with COVID status.

## 2020-03-05 IMAGING — DX DG CHEST 1V PORT
1 series · 1 of 1 positions shown · non-contrast
Comparison: 03/25/2019

CLINICAL DATA: Aspiration.

EXAM:
PORTABLE CHEST 1 VIEW

[chest ap]
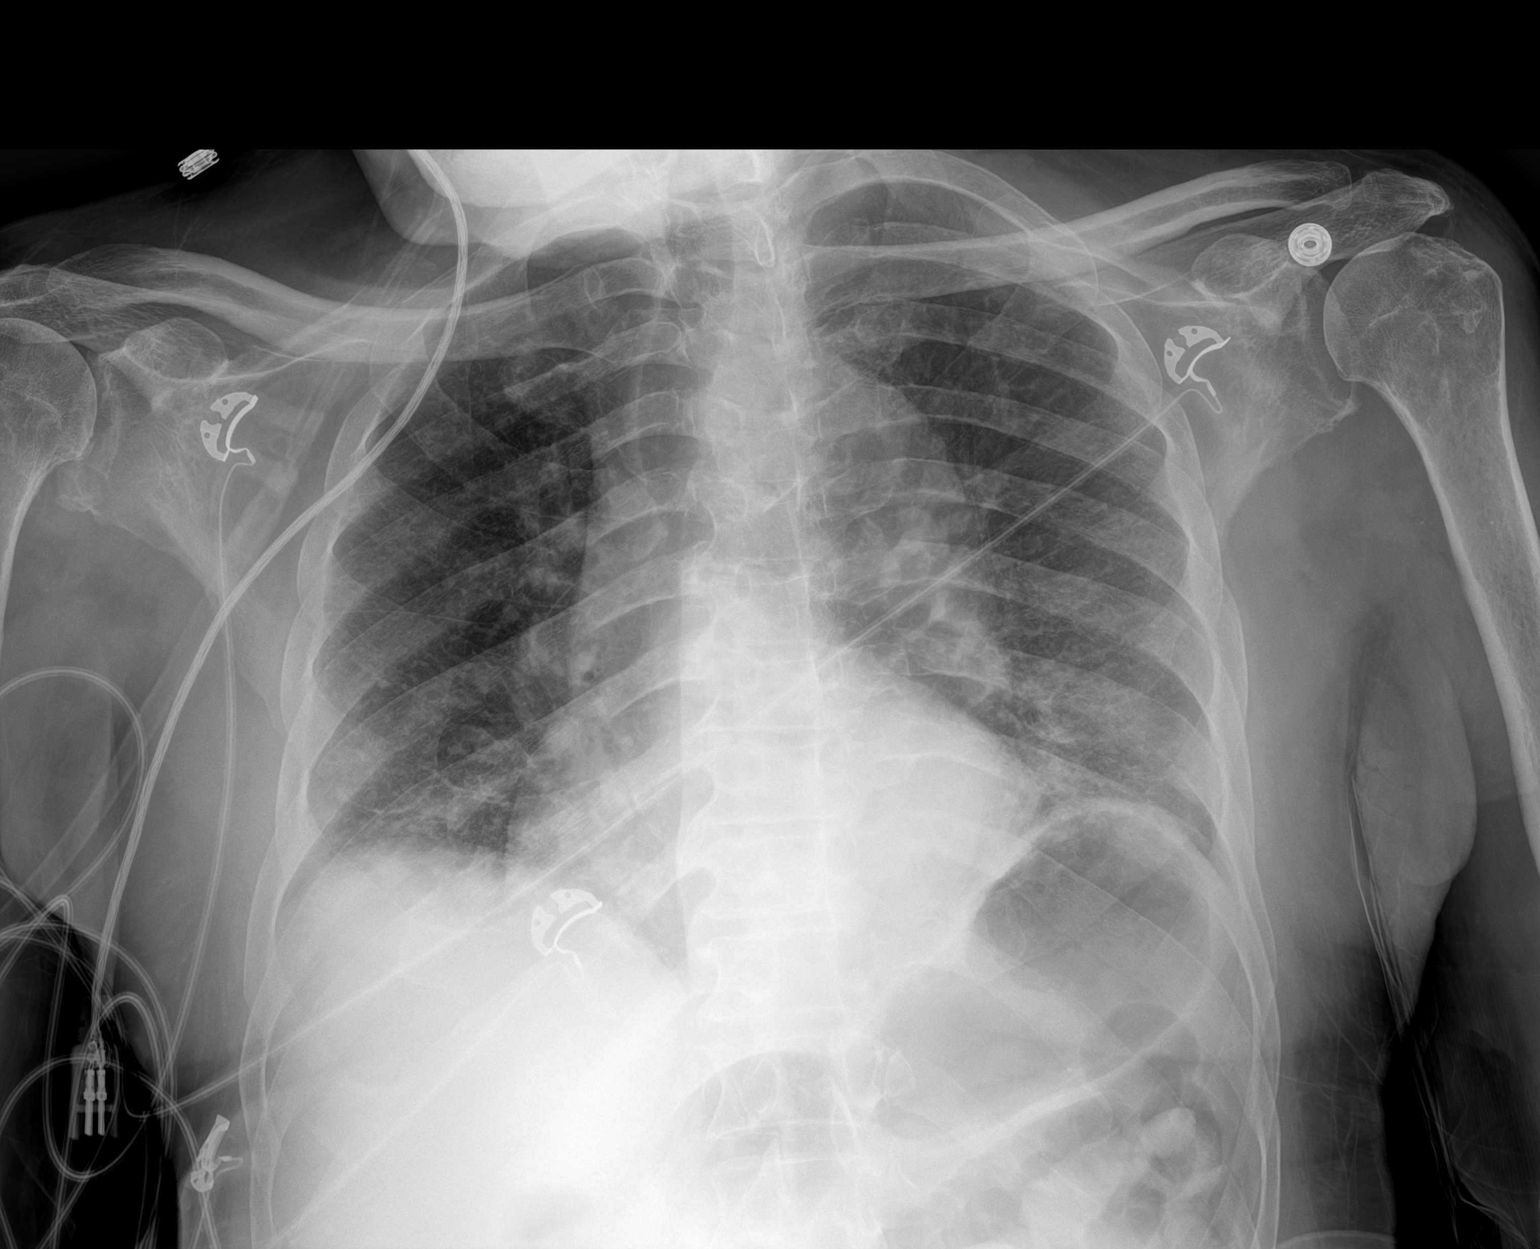

[1 of 1 positions shown; findings below may reference images not displayed]

FINDINGS: 2813 hours. The cardio pericardial silhouette is enlarged.
Interstitial markings are diffusely coarsened with chronic features.
Basilar subtle airspace opacity in the right lower lung is stable
with interval progression of airspace opacity at the left base. No
substantial pleural effusion. The visualized bony structures of the
thorax are intact. Telemetry leads overlie the chest.
IMPRESSION: 1. Bibasilar airspace disease, progressive on the left.

## 2020-03-06 IMAGING — DX DG CHEST 1V PORT
1 series · 1 of 1 positions shown · non-contrast
Comparison: March 30, 2019 and March 25, 2019

CLINICAL DATA: [id number] positive

EXAM:
PORTABLE CHEST 1 VIEW

[chest ap]
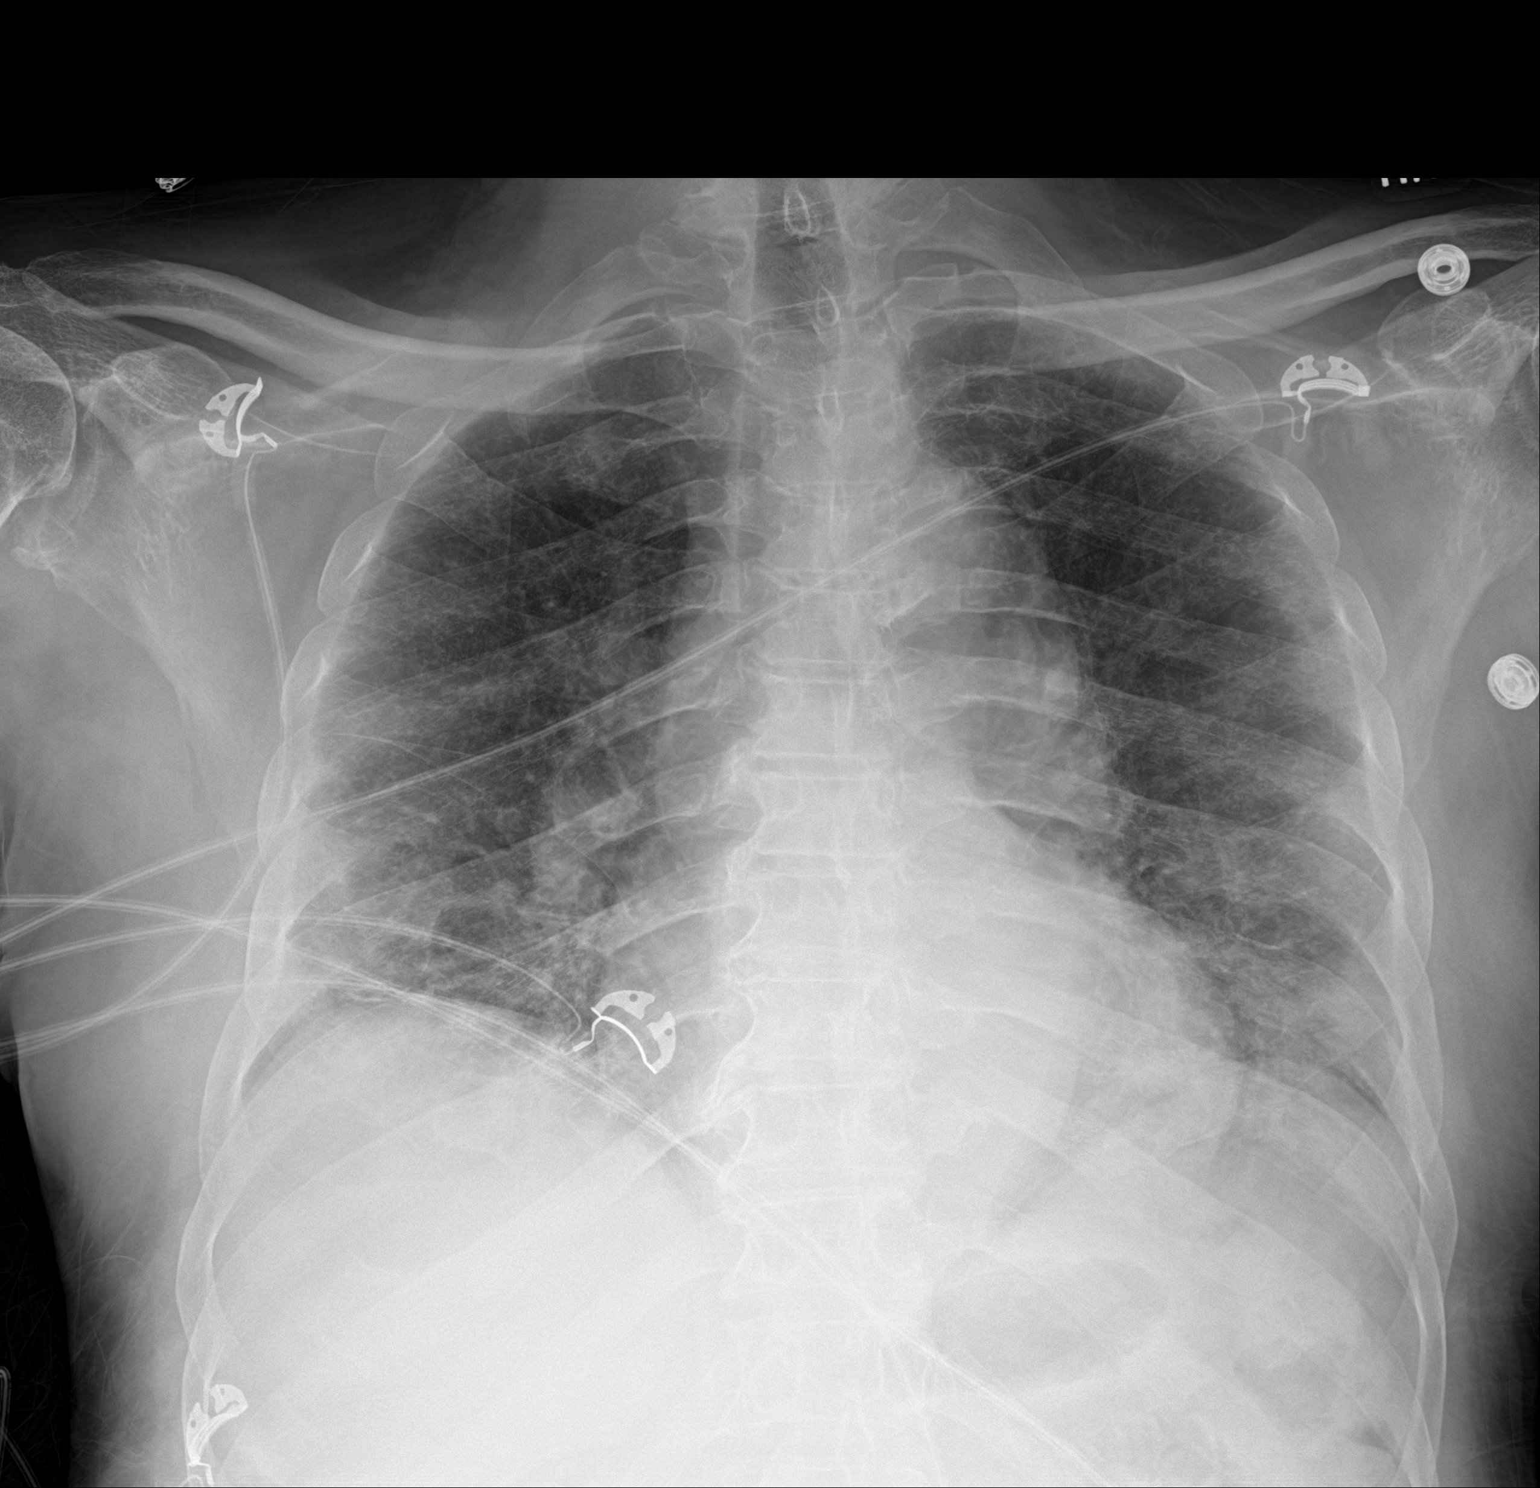

[1 of 1 positions shown; findings below may reference images not displayed]

FINDINGS: Patchy airspace opacity is noted in the left mid lower lung zones.
More subtle ill-defined opacity noted in the right mid lung and
right base regions. Subtle increased opacity in the right mid lung
compared to 1 day prior. Heart is slightly enlarged with pulmonary
vascularity normal. No adenopathy. Thoracic aorta is mildly
prominent but stable. No adenopathy. No bone lesions.
IMPRESSION: Patchy airspace opacity bilaterally, slightly increased in the right
mid lung and essentially stable elsewhere. Stable cardiac
prominence. Thoracic aortic prominence suggest chronic hypertensive
change. No adenopathy.

## 2020-03-08 ENCOUNTER — Ambulatory Visit: Payer: Medicare Other | Admitting: Nurse Practitioner

## 2020-03-08 DIAGNOSIS — R278 Other lack of coordination: Secondary | ICD-10-CM | POA: Diagnosis not present

## 2020-03-08 DIAGNOSIS — M6281 Muscle weakness (generalized): Secondary | ICD-10-CM | POA: Diagnosis not present

## 2020-03-08 DIAGNOSIS — R293 Abnormal posture: Secondary | ICD-10-CM | POA: Diagnosis not present

## 2020-03-16 DIAGNOSIS — R278 Other lack of coordination: Secondary | ICD-10-CM | POA: Diagnosis not present

## 2020-03-16 DIAGNOSIS — R293 Abnormal posture: Secondary | ICD-10-CM | POA: Diagnosis not present

## 2020-03-16 DIAGNOSIS — M6281 Muscle weakness (generalized): Secondary | ICD-10-CM | POA: Diagnosis not present

## 2020-03-22 ENCOUNTER — Ambulatory Visit (INDEPENDENT_AMBULATORY_CARE_PROVIDER_SITE_OTHER): Payer: Medicare Other | Admitting: Nurse Practitioner

## 2020-03-22 ENCOUNTER — Other Ambulatory Visit: Payer: Self-pay

## 2020-03-22 ENCOUNTER — Encounter: Payer: Self-pay | Admitting: Nurse Practitioner

## 2020-03-22 VITALS — BP 142/84 | HR 89 | Temp 97.6°F | Resp 16 | Ht 69.0 in | Wt 174.4 lb

## 2020-03-22 DIAGNOSIS — I10 Essential (primary) hypertension: Secondary | ICD-10-CM

## 2020-03-22 DIAGNOSIS — E1165 Type 2 diabetes mellitus with hyperglycemia: Secondary | ICD-10-CM

## 2020-03-22 DIAGNOSIS — Z86711 Personal history of pulmonary embolism: Secondary | ICD-10-CM | POA: Diagnosis not present

## 2020-03-22 DIAGNOSIS — F0391 Unspecified dementia with behavioral disturbance: Secondary | ICD-10-CM

## 2020-03-22 DIAGNOSIS — Z86718 Personal history of other venous thrombosis and embolism: Secondary | ICD-10-CM

## 2020-03-22 LAB — POCT GLYCOSYLATED HEMOGLOBIN (HGB A1C): Hemoglobin A1C: 7.8 % — AB (ref 4.0–5.6)

## 2020-03-22 MED ORDER — GLIMEPIRIDE 2 MG PO TABS: 2.0000 mg | ORAL_TABLET | Freq: Two times a day (BID) | ORAL | 1 refills | Status: AC

## 2020-03-22 NOTE — Progress Notes (Signed)
[facility] [address] [place], [place] [address]  Internal MEDICINE  Office Visit Note  Patient Name: Tyler DanielsLorenzo Jimenez  [id number] 31, 2033  [id number]  Date of Service: 03/22/2020  Chief Complaint  Patient presents with  . Follow-up  . Diabetes  . Hypertension  . controlled substance form    reviewed with PT  . Quality Metric Gaps    flu,tetnaus,covid    The patient is here for follow up visit. His blood sugars are elevated today. HgbA1c is 7.8 today, up from 6.5 at his most recent check. No changes in diet or medication have been made. Blood pressure doing well with current medications.  Patient was hospitalized last year with COVID 19 pneumonia. As a result, he suffered bilateral DVT and PE. Currently on eliquis. Has not had recheck of lower extremity veins or recheck of CTA chest.  Patient's family member reports that patient did have diabetic eye exam. This was a few months ago. Will call back with provider and date seen.       Current Medication: Outpatient Encounter Medications as of 03/22/2020  Medication Sig  . acetaminophen (TYLENOL) 500 MG tablet Take 500 mg by mouth every 6 (six) hours as needed for mild pain.  Marland Kitchen. amLODipine (NORVASC) 10 MG tablet Take 10 mg by mouth daily.  Marland Kitchen. ELIQUIS 5 MG TABS tablet Take 5 mg by mouth 2 (two) times daily.  Marland Kitchen. glimepiride (AMARYL) 2 MG tablet Take 1 tablet (2 mg total) by mouth 2 (two) times daily.  Marland Kitchen. glucose blood (FREESTYLE LITE) test strip 1 each by Other route 2 (two) times daily. diag e11.65  . guaifenesin (ROBITUSSIN) 100 MG/5ML syrup Take 200 mg by mouth every 6 (six) hours as needed for cough.  Marland Kitchen. KOMBIGLYZE XR 09-998 MG TB24 Take 1 tablet by mouth daily with supper.  . loperamide (IMODIUM) 2 MG capsule Take 2-4 mg by mouth as needed for diarrhea or loose stools.  Marland Kitchen. LORazepam (ATIVAN) 0.5 MG tablet Take 0.5 mg by mouth every 8 (eight) hours as needed (agitation).  . magnesium hydroxide (MILK OF MAGNESIA) 400 MG/5ML  suspension Take 30 mLs by mouth daily as needed for mild constipation.  . mirtazapine (REMERON) 7.5 MG tablet Take 1 tablet (7.5 mg total) by mouth at bedtime as needed.  . Multiple Vitamin (MULTI-VITAMINS) TABS Take 1 tablet by mouth daily.   . pioglitazone (ACTOS) 45 MG tablet Take 1 tablet (45 mg total) by mouth daily.  Marland Kitchen. zinc sulfate 220 (50 Zn) MG capsule Take 220 mg by mouth daily.  . [DISCONTINUED] glimepiride (AMARYL) 2 MG tablet Take 1 tablet (2 mg total) by mouth daily.   No facility-administered encounter medications on file as of 03/22/2020.    Surgical History: Past Surgical History:  Procedure Laterality Date  . CATARACT EXTRACTION W/PHACO Right 01/05/2015   Procedure: CATARACT EXTRACTION PHACO AND INTRAOCULAR LENS PLACEMENT (IOC);  Surgeon: Galen ManilaWilliam Porfilio, MD;  Location: [facility] ORS;  Service: Ophthalmology;  Laterality: Right;  US: 02:33.9 AP%: 29.9 CDE: 46.04 Lot# [id number]  . CATARACT EXTRACTION W/PHACO Left 01/19/2015   Procedure: CATARACT EXTRACTION PHACO AND INTRAOCULAR LENS PLACEMENT (IOC);  Surgeon: Galen ManilaWilliam Porfilio, MD;  Location: [facility] ORS;  Service: Ophthalmology;  Laterality: Left;  US: 03:59.3 AP%: 28.9 CDE: 69.13 Lot # [id number]  . KNEE SURGERY Right     Medical History: Past Medical History:  Diagnosis Date  . Asthma    as a child  . Cataract   . Dementia (HCC)   . Diabetes mellitus  without complication (HCC)   . History of blood clots   . HOH (hard of hearing)   . Hypertension     Family History: Family History  Problem Relation Age of Onset  . Prostate cancer Father   . Bladder Cancer Neg Hx   . Kidney cancer Neg Hx     Social History   Socioeconomic History  . Marital status: Married    Spouse name: Not on file  . Number of children: Not on file  . Years of education: Not on file  . Highest education level: Not on file  Occupational History  . Not on file  Tobacco Use  . Smoking status: Former Games developer  . Smokeless tobacco: Never  Used  Vaping Use  . Vaping Use: Never used  Substance and Sexual Activity  . Alcohol use: Yes    Alcohol/week: 0.0 standard drinks    Comment: occ  . Drug use: No  . Sexual activity: Not on file  Other Topics Concern  . Not on file  Social History Narrative  . Not on file   Social Determinants of Health   Financial Resource Strain:   . Difficulty of Paying Living Expenses: Not on file  Food Insecurity:   . Worried About Programme researcher, broadcasting/film/video in the Last Year: Not on file  . Ran Out of Food in the Last Year: Not on file  Transportation Needs:   . Lack of Transportation (Medical): Not on file  . Lack of Transportation (Non-Medical): Not on file  Physical Activity:   . Days of Exercise per Week: Not on file  . Minutes of Exercise per Session: Not on file  Stress:   . Feeling of Stress : Not on file  Social Connections:   . Frequency of Communication with Friends and Family: Not on file  . Frequency of Social Gatherings with Friends and Family: Not on file  . Attends Religious Services: Not on file  . Active Member of Clubs or Organizations: Not on file  . Attends Banker Meetings: Not on file  . Marital Status: Not on file  Intimate Partner Violence:   . Fear of Current or Ex-Partner: Not on file  . Emotionally Abused: Not on file  . Physically Abused: Not on file  . Sexually Abused: Not on file      Review of Systems  Constitutional: Negative for activity change, chills, fatigue and unexpected weight change.  HENT: Negative for congestion, postnasal drip, rhinorrhea, sneezing and sore throat.   Respiratory: Negative for cough, chest tightness, shortness of breath and wheezing.   Cardiovascular: Negative for chest pain and palpitations.       Blood pressure well managed  Gastrointestinal: Negative for abdominal pain, constipation, diarrhea, nausea and vomiting.  Endocrine: Negative for cold intolerance, heat intolerance, polydipsia and polyuria.        Elevated blood sugars today.   Genitourinary: Negative for urgency.  Musculoskeletal: Negative for arthralgias, back pain, joint swelling and neck pain.  Skin: Negative for rash.  Allergic/Immunologic: Negative for environmental allergies.  Neurological: Positive for weakness. Negative for tremors and numbness.       Memory issues with worsening dementia. Stable since his last visit .  Hematological: Negative for adenopathy. Does not bruise/bleed easily.  Psychiatric/Behavioral: Negative for behavioral problems (Depression), sleep disturbance and suicidal ideas. The patient is not nervous/anxious.     Today's Vitals   03/22/20 0844  BP: (!) 142/84  Pulse: 89  Resp: 16  Temp:  97.6 F (36.4 C)  SpO2: 95%  Weight: 174 lb 6.4 oz (79.1 kg)  Height: 5\' 9"  (1.753 m)   Body mass index is 25.75 kg/m.  Physical Exam Vitals and nursing note reviewed.  Constitutional:      General: He is not in acute distress.    Appearance: Normal appearance. He is well-developed. He is not diaphoretic.  HENT:     Head: Normocephalic and atraumatic.     Nose: Nose normal.     Mouth/Throat:     Pharynx: No oropharyngeal exudate.  Eyes:     Extraocular Movements: Extraocular movements intact.     Pupils: Pupils are equal, round, and reactive to light.  Neck:     Thyroid: No thyromegaly.     Vascular: No carotid bruit or JVD.     Trachea: No tracheal deviation.  Cardiovascular:     Rate and Rhythm: Normal rate and regular rhythm.     Heart sounds: Normal heart sounds. No murmur heard.  No friction rub. No gallop.   Pulmonary:     Effort: Pulmonary effort is normal. No respiratory distress.     Breath sounds: Normal breath sounds. No wheezing or rales.  Chest:     Chest wall: No tenderness.  Abdominal:     Palpations: Abdomen is soft.  Musculoskeletal:        General: Normal range of motion.     Cervical back: Normal range of motion and neck supple.     Comments: Right leg soreness.    Lymphadenopathy:     Cervical: No cervical adenopathy.  Skin:    General: Skin is warm and dry.  Neurological:     Mental Status: He is alert and oriented to person, place, and time. Mental status is at baseline.     Cranial Nerves: No cranial nerve deficit.  Psychiatric:        Mood and Affect: Mood is depressed.        Behavior: Behavior normal.        Thought Content: Thought content normal.        Judgment: Judgment normal.    Assessment/Plan: 1. Uncontrolled type 2 diabetes mellitus with hyperglycemia (HCC) - POCT HgB A1C 7.8 today. Increase glimepiride to twice daily. Continue other diabetic medications as prescribed. Monitor blood sugars closely. Recheck HgbA1c at next visit.  - glimepiride (AMARYL) 2 MG tablet; Take 1 tablet (2 mg total) by mouth 2 (two) times daily.  Dispense: 180 tablet; Refill: 1  2. Essential hypertension Stable. Continue bp medication as prescribed   3. Dementia with behavioral disturbance, unspecified dementia type (HCC) Stable.   4. History of DVT (deep vein thrombosis) Get venous doppler for surveillance.  - Venous Img Lower Bilateral; Future  5. History of pulmonary embolus (PE) CTA chest for surveillance.  - CT Angio Chest W/Cm &/Or Wo Cm; Future  General Counseling: Torien verbalizes understanding of the findings of todays visit and agrees with plan of treatment. I have discussed any further diagnostic evaluation that may be needed or ordered today. We also reviewed his medications today. he has been encouraged to call the office with any questions or concerns that should arise related to todays visit.  Diabetes Counseling:  1. Addition of ACE inh/ ARB'S for nephroprotection. Microalbumin is updated  2. Diabetic foot care, prevention of complications. Podiatry consult 3. Exercise and lose weight.  4. Diabetic eye examination, Diabetic eye exam is updated  5. Monitor blood sugar closlely. nutrition counseling.  6.  Sign and symptoms of  hypoglycemia including shaking sweating,confusion and headaches.   This patient was seen by Vincent Gros FNP Collaboration with Dr Lyndon Code as a part of collaborative care agreement  Orders Placed This Encounter  Procedures  . US Venous Img Lower Bilateral  . CT Angio Chest W/Cm &/Or Wo Cm  . POCT HgB A1C    Meds ordered this encounter  Medications  . glimepiride (AMARYL) 2 MG tablet    Sig: Take 1 tablet (2 mg total) by mouth 2 (two) times daily.    Dispense:  180 tablet    Refill:  1    Please note increased dose    Order Specific Question:   Supervising Provider    Answer:   Lyndon Code [[id number]]    Total time spent:40 Minutes  Time spent includes review of chart, medications, test results, and follow up plan with the patient.      Dr Lyndon Code Internal medicine

## 2020-04-05 ENCOUNTER — Ambulatory Visit
Admission: RE | Admit: 2020-04-05 | Discharge: 2020-04-05 | Disposition: A | Discharge status: Home / Self Care | Payer: Medicare Other | Source: Ambulatory Visit | Attending: Nurse Practitioner | Admitting: Nurse Practitioner

## 2020-04-05 ENCOUNTER — Other Ambulatory Visit: Payer: Self-pay

## 2020-04-05 DIAGNOSIS — Z86711 Personal history of pulmonary embolism: Secondary | ICD-10-CM | POA: Diagnosis not present

## 2020-04-05 DIAGNOSIS — R911 Solitary pulmonary nodule: Secondary | ICD-10-CM | POA: Diagnosis not present

## 2020-04-05 LAB — POCT I-STAT CREATININE: Creatinine, Ser: 1.2 mg/dL (ref 0.61–1.24)

## 2020-04-05 MED ORDER — IOHEXOL 350 MG/ML SOLN
75.0000 mL | Freq: Once | INTRAVENOUS | Status: AC | PRN
  Administered 2020-04-05: 75 mL via INTRAVENOUS

## 2020-04-09 ENCOUNTER — Other Ambulatory Visit: Payer: Self-pay

## 2020-04-09 ENCOUNTER — Ambulatory Visit (INDEPENDENT_AMBULATORY_CARE_PROVIDER_SITE_OTHER): Payer: Medicare Other

## 2020-04-09 DIAGNOSIS — Z86718 Personal history of other venous thrombosis and embolism: Secondary | ICD-10-CM | POA: Diagnosis not present

## 2020-04-12 DIAGNOSIS — M79675 Pain in left toe(s): Secondary | ICD-10-CM | POA: Diagnosis not present

## 2020-04-12 DIAGNOSIS — B351 Tinea unguium: Secondary | ICD-10-CM | POA: Diagnosis not present

## 2020-04-12 DIAGNOSIS — M79674 Pain in right toe(s): Secondary | ICD-10-CM | POA: Diagnosis not present

## 2020-04-12 DIAGNOSIS — R059 Cough, unspecified: Secondary | ICD-10-CM | POA: Diagnosis not present

## 2020-04-12 DIAGNOSIS — Z86718 Personal history of other venous thrombosis and embolism: Secondary | ICD-10-CM | POA: Diagnosis not present

## 2020-04-12 DIAGNOSIS — F0391 Unspecified dementia with behavioral disturbance: Secondary | ICD-10-CM | POA: Diagnosis not present

## 2020-04-12 DIAGNOSIS — E119 Type 2 diabetes mellitus without complications: Secondary | ICD-10-CM | POA: Diagnosis not present

## 2020-04-13 DIAGNOSIS — R059 Cough, unspecified: Secondary | ICD-10-CM | POA: Diagnosis not present

## 2020-04-13 NOTE — Progress Notes (Signed)
Improved chest CT. No evidence PE. Resolution of ground glass opacity seen in the past. Aortic atherosclerosis present. Discuss with patient and family at next visit. 04/20/2020

## 2020-04-14 ENCOUNTER — Emergency Department (HOSPITAL_COMMUNITY): Payer: Medicare Other

## 2020-04-14 ENCOUNTER — Encounter (HOSPITAL_COMMUNITY): Payer: Self-pay

## 2020-04-14 ENCOUNTER — Other Ambulatory Visit: Payer: Self-pay

## 2020-04-14 ENCOUNTER — Emergency Department (HOSPITAL_COMMUNITY)
Admission: EM | Admit: 2020-04-14 | Discharge: 2020-04-14 | Disposition: A | Discharge status: Home / Self Care | Payer: Medicare Other | Attending: Emergency Medicine | Admitting: Emergency Medicine

## 2020-04-14 DIAGNOSIS — Z8616 Personal history of COVID-19: Secondary | ICD-10-CM | POA: Insufficient documentation

## 2020-04-14 DIAGNOSIS — I10 Essential (primary) hypertension: Secondary | ICD-10-CM | POA: Diagnosis not present

## 2020-04-14 DIAGNOSIS — Z7984 Long term (current) use of oral hypoglycemic drugs: Secondary | ICD-10-CM | POA: Diagnosis not present

## 2020-04-14 DIAGNOSIS — Z87891 Personal history of nicotine dependence: Secondary | ICD-10-CM | POA: Insufficient documentation

## 2020-04-14 DIAGNOSIS — E1165 Type 2 diabetes mellitus with hyperglycemia: Secondary | ICD-10-CM | POA: Insufficient documentation

## 2020-04-14 DIAGNOSIS — Z743 Need for continuous supervision: Secondary | ICD-10-CM | POA: Diagnosis not present

## 2020-04-14 DIAGNOSIS — R52 Pain, unspecified: Secondary | ICD-10-CM | POA: Diagnosis not present

## 2020-04-14 DIAGNOSIS — F0390 Unspecified dementia without behavioral disturbance: Secondary | ICD-10-CM | POA: Insufficient documentation

## 2020-04-14 DIAGNOSIS — R410 Disorientation, unspecified: Secondary | ICD-10-CM | POA: Diagnosis not present

## 2020-04-14 DIAGNOSIS — R059 Cough, unspecified: Secondary | ICD-10-CM | POA: Insufficient documentation

## 2020-04-14 DIAGNOSIS — J45909 Unspecified asthma, uncomplicated: Secondary | ICD-10-CM | POA: Diagnosis not present

## 2020-04-14 DIAGNOSIS — Z7901 Long term (current) use of anticoagulants: Secondary | ICD-10-CM | POA: Diagnosis not present

## 2020-04-14 DIAGNOSIS — Z79899 Other long term (current) drug therapy: Secondary | ICD-10-CM | POA: Insufficient documentation

## 2020-04-14 DIAGNOSIS — J90 Pleural effusion, not elsewhere classified: Secondary | ICD-10-CM | POA: Diagnosis not present

## 2020-04-14 DIAGNOSIS — R279 Unspecified lack of coordination: Secondary | ICD-10-CM | POA: Diagnosis not present

## 2020-04-14 DIAGNOSIS — Z20822 Contact with and (suspected) exposure to covid-19: Secondary | ICD-10-CM | POA: Diagnosis not present

## 2020-04-14 LAB — RESP PANEL BY RT-PCR (FLU A&B, COVID) ARPGX2
Influenza A by PCR: NEGATIVE
Influenza B by PCR: NEGATIVE
SARS Coronavirus 2 by RT PCR: NEGATIVE

## 2020-04-14 NOTE — ED Notes (Signed)
Updates provided to family.  

## 2020-04-14 NOTE — ED Notes (Signed)
Called [facility] for update. enroute ago

## 2020-04-14 NOTE — ED Notes (Signed)
Called [facility] and gave report to [employer]. Per genevia send back by [facility]

## 2020-04-14 NOTE — Discharge Instructions (Addendum)
The patient's Covid test, flu test, chest x-ray are without acute findings here today.  Follow-up with the nursing home physician

## 2020-04-14 NOTE — ED Notes (Signed)
Called [facility] for transport back to facility 

## 2020-04-14 NOTE — ED Triage Notes (Signed)
Productive cough since Friday with body aches. covid test at facility negative. Hx of dementia. Resides at [facility].

## 2020-04-14 NOTE — ED Notes (Signed)
Patient ambulated to restroom with supervision.

## 2020-04-14 NOTE — ED Provider Notes (Signed)
[facility]-EMERGENCY DEPT Provider Note   CSN: [id number] Arrival date & time: 04/14/20  7628     History Chief Complaint  Patient presents with  . Cough    Tyler Jimenez is a 86 y.o. male.  86 year old male with history of dementia who presents from nursing home due to several days of nonproductive cough.  Patient himself is alert and oriented to person and place.  He denies any symptoms at this time.  According to EMS, patient has not been febrile at the facility.  Had a rapid Covid test that was -4 days ago.        Past Medical History:  Diagnosis Date  . Asthma    as a child  . Cataract   . Dementia (HCC)   . Diabetes mellitus without complication (HCC)   . History of blood clots   . HOH (hard of hearing)   . Hypertension     Patient Active Problem List   Diagnosis Date Noted  . History of DVT (deep vein thrombosis) 10/20/2019  . History of pulmonary embolus (PE) 10/20/2019  . Acute deep vein thrombosis (DVT) of lower extremity (HCC)   . Acute pulmonary embolism with acute cor pulmonale (HCC)   . COVID-19 virus infection 03/26/2019  . DKA, type 2 (HCC) 03/25/2019  . AKI (acute kidney injury) (HCC) 03/25/2019  . Right lower lobe pulmonary infiltrate 03/25/2019  . COVID-19 03/25/2019  . Healthcare-associated pneumonia 03/25/2019  . Urinary tract infection with hematuria 01/26/2019  . Loss of appetite 01/26/2019  . Current moderate episode of major depressive disorder without prior episode (HCC) 01/26/2019  . Fall 09/21/2018  . Encounter for health maintenance examination with abnormal findings 04/29/2018  . Dysuria 04/29/2018  . Uncontrolled type 2 diabetes mellitus with hyperglycemia (HCC) 10/17/2017  . Tinea pedis of both feet 10/17/2017  . Dementia with behavioral disturbance (HCC) 04/25/2017  . Controlled type 2 diabetes mellitus without complication, without long-term current use of insulin (HCC) 04/25/2017  . Essential  hypertension 04/25/2017  . Cardiac murmur 04/25/2017  . Mixed hyperlipidemia 04/25/2017    Past Surgical History:  Procedure Laterality Date  . CATARACT EXTRACTION W/PHACO Right 01/05/2015   Procedure: CATARACT EXTRACTION PHACO AND INTRAOCULAR LENS PLACEMENT (IOC);  Surgeon: Galen Manila, MD;  Location: [facility] ORS;  Service: Ophthalmology;  Laterality: Right;  [place]: 02:33.9 AP%: 29.9 CDE: 46.04 Lot# [id number]  . CATARACT EXTRACTION W/PHACO Left 01/19/2015   Procedure: CATARACT EXTRACTION PHACO AND INTRAOCULAR LENS PLACEMENT (IOC);  Surgeon: Galen Manila, MD;  Location: [facility] ORS;  Service: Ophthalmology;  Laterality: Left;  [place]: 03:59.3 AP%: 28.9 CDE: 69.13 Lot # [id number]  . KNEE SURGERY Right        Family History  Problem Relation Age of Onset  . Prostate cancer Father   . Bladder Cancer Neg Hx   . Kidney cancer Neg Hx     Social History   Tobacco Use  . Smoking status: Former Games developer  . Smokeless tobacco: Never Used  Vaping Use  . Vaping Use: Never used  Substance Use Topics  . Alcohol use: Yes    Alcohol/week: 0.0 standard drinks    Comment: occ  . Drug use: No    Home Medications Prior to Admission medications   Medication Sig Start Date End Date Taking? Authorizing Provider  acetaminophen (TYLENOL) 500 MG tablet Take 500 mg by mouth every 6 (six) hours as needed for mild pain.    [provider]  amLODipine (NORVASC) 10 MG tablet  Take 10 mg by mouth daily. 04/01/19   [provider]  ELIQUIS 5 MG TABS tablet Take 5 mg by mouth 2 (two) times daily. 04/01/19   [provider]  glimepiride (AMARYL) 2 MG tablet Take 1 tablet (2 mg total) by mouth 2 (two) times daily. 03/22/20   Carlean Jews, NP  glucose blood (FREESTYLE LITE) test strip 1 each by Other route 2 (two) times daily. diag e11.65 01/15/19   Carlean Jews, NP  guaifenesin (ROBITUSSIN) 100 MG/5ML syrup Take 200 mg by mouth every 6 (six) hours as needed for cough.     [provider]  KOMBIGLYZE XR 09-998 MG TB24 Take 1 tablet by mouth daily with supper. 12/16/18   Carlean Jews, NP  loperamide (IMODIUM) 2 MG capsule Take 2-4 mg by mouth as needed for diarrhea or loose stools.    [provider]  LORazepam (ATIVAN) 0.5 MG tablet Take 0.5 mg by mouth every 8 (eight) hours as needed (agitation).    [provider]  magnesium hydroxide (MILK OF MAGNESIA) 400 MG/5ML suspension Take 30 mLs by mouth daily as needed for mild constipation.    [provider]  mirtazapine (REMERON) 7.5 MG tablet Take 1 tablet (7.5 mg total) by mouth at bedtime as needed. 04/01/19   Zigmund Daniel., MD  Multiple Vitamin (MULTI-VITAMINS) TABS Take 1 tablet by mouth daily.     [provider]  pioglitazone (ACTOS) 45 MG tablet Take 1 tablet (45 mg total) by mouth daily. 08/30/18   Carlean Jews, NP  zinc sulfate 220 (50 Zn) MG capsule Take 220 mg by mouth daily.    [provider]    Allergies    Patient has no known allergies.  Review of Systems   Review of Systems  Unable to perform ROS: Dementia    Physical Exam Updated Vital Signs BP (!) 151/77 (BP Location: Right Arm)   Pulse 76   Temp 98.1 F (36.7 C) (Oral)   Resp 15   Ht 1.753 m (5\' 9" )   Wt 72.6 kg   SpO2 98%   BMI 23.63 kg/m   Physical Exam Vitals and nursing note reviewed.  Constitutional:      General: He is not in acute distress.    Appearance: Normal appearance. He is well-developed. He is not toxic-appearing.  HENT:     Head: Normocephalic and atraumatic.  Eyes:     General: Lids are normal.     Conjunctiva/sclera: Conjunctivae normal.     Pupils: Pupils are equal, round, and reactive to light.  Neck:     Thyroid: No thyroid mass.     Trachea: No tracheal deviation.  Cardiovascular:     Rate and Rhythm: Normal rate and regular rhythm.     Heart sounds: Normal heart sounds. No murmur heard.  No gallop.   Pulmonary:     Effort:  Pulmonary effort is normal. No respiratory distress.     Breath sounds: Normal breath sounds. No stridor. No decreased breath sounds, wheezing, rhonchi or rales.  Abdominal:     General: Bowel sounds are normal. There is no distension.     Palpations: Abdomen is soft.     Tenderness: There is no abdominal tenderness. There is no rebound.  Musculoskeletal:        General: No tenderness. Normal range of motion.     Cervical back: Normal range of motion and neck supple.  Skin:    General: Skin  is warm and dry.     Findings: No abrasion or rash.  Neurological:     Mental Status: He is alert and oriented to person, place, and time.     GCS: GCS eye subscore is 4. GCS verbal subscore is 5. GCS motor subscore is 6.     Cranial Nerves: No cranial nerve deficit.     Sensory: No sensory deficit.  Psychiatric:        Speech: Speech normal.        Behavior: Behavior normal.     ED Results / Procedures / Treatments   Labs (all labs ordered are listed, but only abnormal results are displayed) Labs Reviewed  RESP PANEL BY RT-PCR (FLU A&B, COVID) ARPGX2    EKG None  Radiology No results found.  Procedures Procedures (including critical care time)  Medications Ordered in ED Medications - No data to display  ED Course  I have reviewed the triage vital signs and the nursing notes.  Pertinent labs & imaging results that were available during my care of the patient were reviewed by me and considered in my medical decision making (see chart for details).    MDM Rules/Calculators/A&P                          Chest x-ray without acute process.  Covid and flu test negative.  Is clinically stable at this time.  Suspect viral process and will discharge Final Clinical Impression(s) / ED Diagnoses Final diagnoses:  Cough    Rx / DC Orders ED Discharge Orders    None       Lorre Nick, MD 04/14/20 1143

## 2020-04-20 ENCOUNTER — Ambulatory Visit: Payer: Medicare Other | Admitting: Hospice and Palliative Medicine

## 2020-04-20 DIAGNOSIS — F0391 Unspecified dementia with behavioral disturbance: Secondary | ICD-10-CM | POA: Diagnosis not present

## 2020-04-20 DIAGNOSIS — E119 Type 2 diabetes mellitus without complications: Secondary | ICD-10-CM | POA: Diagnosis not present

## 2020-04-20 DIAGNOSIS — J4520 Mild intermittent asthma, uncomplicated: Secondary | ICD-10-CM | POA: Diagnosis not present

## 2020-04-28 ENCOUNTER — Encounter: Payer: Self-pay | Admitting: Hospice and Palliative Medicine

## 2020-04-28 ENCOUNTER — Other Ambulatory Visit: Payer: Self-pay

## 2020-04-28 ENCOUNTER — Ambulatory Visit (INDEPENDENT_AMBULATORY_CARE_PROVIDER_SITE_OTHER): Payer: Medicare Other | Admitting: Hospice and Palliative Medicine

## 2020-04-28 VITALS — BP 118/72 | HR 58 | Temp 97.2°F | Resp 16 | Ht 69.0 in | Wt 173.0 lb

## 2020-04-28 DIAGNOSIS — Z86718 Personal history of other venous thrombosis and embolism: Secondary | ICD-10-CM

## 2020-04-28 DIAGNOSIS — F0391 Unspecified dementia with behavioral disturbance: Secondary | ICD-10-CM | POA: Diagnosis not present

## 2020-04-28 DIAGNOSIS — Z86711 Personal history of pulmonary embolism: Secondary | ICD-10-CM | POA: Diagnosis not present

## 2020-04-28 DIAGNOSIS — R109 Unspecified abdominal pain: Secondary | ICD-10-CM | POA: Diagnosis not present

## 2020-04-28 NOTE — Progress Notes (Signed)
[facility] [address] [place], [place] [address]  Internal MEDICINE  Office Visit Note  Patient Name: Tyler Jimenez  [id number]  [id number]  Date of Service: 05/07/2020  Chief Complaint  Patient presents with  . Follow-up    Sometimes when pt eats veggies (cabbage) it bothers his stomach   . Diabetes  . Hypertension  . Asthma  . Quality Metric Gaps    PNA, eye exam done in august    HPI Patient is here for routine follow-up Son in law is present during exam today Currently resides in nursing facility Reviewed recent bilateral lower extremity US--negative for DVT History of DVT, started on Eliquis at that time--US to help determine if clot had resolved Son in law under the impression eliquis would be discontinued if blood clot had dissolved Discussed the risk of future clots once eliquis therapy had been discontinued, risk associated after development of one clot for further clots  Son in law mentions he has been complaining of abdominal pain, gas pain associated with certain vegetables Reviewed medication list from facility--PRN medications for gas as well as constipation ordered but not administered recently  Current Medication: Outpatient Encounter Medications as of 04/28/2020  Medication Sig  . acetaminophen (TYLENOL) 500 MG tablet Take 500 mg by mouth every 6 (six) hours as needed for mild pain.  Marland Kitchen amLODipine (NORVASC) 10 MG tablet Take 10 mg by mouth daily.  Marland Kitchen ELIQUIS 5 MG TABS tablet Take 5 mg by mouth 2 (two) times daily.  Marland Kitchen glimepiride (AMARYL) 2 MG tablet Take 1 tablet (2 mg total) by mouth 2 (two) times daily.  Marland Kitchen glucose blood (FREESTYLE LITE) test strip 1 each by Other route 2 (two) times daily. diag e11.65  . guaifenesin (ROBITUSSIN) 100 MG/5ML syrup Take 200 mg by mouth every 6 (six) hours as needed for cough.  Marland Kitchen KOMBIGLYZE XR 09-998 MG TB24 Take 1 tablet by mouth daily with supper.  . loperamide (IMODIUM) 2 MG capsule Take 2-4 mg by mouth as  needed for diarrhea or loose stools.  Marland Kitchen LORazepam (ATIVAN) 0.5 MG tablet Take 0.5 mg by mouth every 8 (eight) hours as needed (agitation).  . magnesium hydroxide (MILK OF MAGNESIA) 400 MG/5ML suspension Take 30 mLs by mouth daily as needed for mild constipation.  . mirtazapine (REMERON) 7.5 MG tablet Take 1 tablet (7.5 mg total) by mouth at bedtime as needed.  . Multiple Vitamin (MULTI-VITAMINS) TABS Take 1 tablet by mouth daily.   . pioglitazone (ACTOS) 45 MG tablet Take 1 tablet (45 mg total) by mouth daily.  Marland Kitchen zinc sulfate 220 (50 Zn) MG capsule Take 220 mg by mouth daily.   No facility-administered encounter medications on file as of 04/28/2020.    Surgical History: Past Surgical History:  Procedure Laterality Date  . CATARACT EXTRACTION W/PHACO Right 01/05/2015   Procedure: CATARACT EXTRACTION PHACO AND INTRAOCULAR LENS PLACEMENT (IOC);  Surgeon: Galen Manila, MD;  Location: [facility] ORS;  Service: Ophthalmology;  Laterality: Right;  [place]: 02:33.9 AP%: 29.9 CDE: 46.04 Lot# [id number]  . CATARACT EXTRACTION W/PHACO Left 01/19/2015   Procedure: CATARACT EXTRACTION PHACO AND INTRAOCULAR LENS PLACEMENT (IOC);  Surgeon: Galen Manila, MD;  Location: [facility] ORS;  Service: Ophthalmology;  Laterality: Left;  [place]: 03:59.3 AP%: 28.9 CDE: 69.13 Lot # [id number]  . KNEE SURGERY Right     Medical History: Past Medical History:  Diagnosis Date  . Asthma    as a child  . Cataract   . Dementia (HCC)   .  Diabetes mellitus without complication (HCC)   . History of blood clots   . HOH (hard of hearing)   . Hypertension     Family History: Family History  Problem Relation Age of Onset  . Prostate cancer Father   . Bladder Cancer Neg Hx   . Kidney cancer Neg Hx     Social History   Socioeconomic History  . Marital status: Married    Spouse name: Not on file  . Number of children: Not on file  . Years of education: Not on file  . Highest education level: Not on file  Occupational  History  . Not on file  Tobacco Use  . Smoking status: Former Games developer  . Smokeless tobacco: Never Used  Vaping Use  . Vaping Use: Never used  Substance and Sexual Activity  . Alcohol use: Yes    Alcohol/week: 0.0 standard drinks    Comment: occ  . Drug use: No  . Sexual activity: Not on file  Other Topics Concern  . Not on file  Social History Narrative  . Not on file   Social Determinants of Health   Financial Resource Strain: Not on file  Food Insecurity: Not on file  Transportation Needs: Not on file  Physical Activity: Not on file  Stress: Not on file  Social Connections: Not on file  Intimate Partner Violence: Not on file     Review of Systems  Constitutional: Negative for chills, fatigue and unexpected weight change.  HENT: Negative for congestion, postnasal drip, rhinorrhea, sneezing and sore throat.   Eyes: Negative for redness.  Respiratory: Negative for cough, chest tightness and shortness of breath.   Cardiovascular: Negative for chest pain and palpitations.  Gastrointestinal: Negative for abdominal pain, constipation, diarrhea, nausea and vomiting.  Genitourinary: Negative for dysuria and frequency.  Musculoskeletal: Negative for arthralgias, back pain, joint swelling and neck pain.  Skin: Negative for rash.  Neurological: Negative for tremors and numbness.  Hematological: Negative for adenopathy. Does not bruise/bleed easily.  Psychiatric/Behavioral: Negative for behavioral problems (Depression), sleep disturbance and suicidal ideas. The patient is not nervous/anxious.     Vital Signs: BP 118/72   Pulse (!) 58   Temp (!) 97.2 F (36.2 C)   Resp 16   Ht 5\' 9"  (1.753 m)   Wt 173 lb (78.5 kg)   SpO2 98%   BMI 25.55 kg/m    Physical Exam Vitals reviewed.  Constitutional:      Appearance: Normal appearance. He is obese.  Cardiovascular:     Rate and Rhythm: Normal rate and regular rhythm.     Pulses: Normal pulses.     Heart sounds: Normal heart  sounds.  Pulmonary:     Effort: Pulmonary effort is normal.     Breath sounds: Normal breath sounds.  Abdominal:     General: Abdomen is flat.     Palpations: Abdomen is soft.  Musculoskeletal:        General: Normal range of motion.  Skin:    General: Skin is warm.  Neurological:     General: No focal deficit present.     Mental Status: He is alert and oriented to person, place, and time. Mental status is at baseline.  Psychiatric:        Mood and Affect: Mood normal.        Behavior: Behavior normal.        Thought Content: Thought content normal.        Judgment: Judgment normal.  Assessment/Plan: 1. History of pulmonary embolus (PE) Started on Eliquis--discussed risk and benefits with son in law and called and spoke with patients daughter and also explained risk and benefits associated with Eliquis She would like some more time to think about this and will contact me if she wishes to discontinue Eliquis therapy  2. History of DVT (deep vein thrombosis) No evidence of DVT on most recent lower extremity US  3. Dementia with behavioral disturbance, unspecified dementia type (HCC) Appears to be at baseline, no recent outbursts reported by facilty  4. Abdominal pain, unspecified abdominal location Discussed with son in law to talk with facility to utilize PRN medications as it seems he is having constipation that is not being treated Patient mentions he is not eating very much at the facility because he does not like the food, does not remember the last time he had BM  General Counseling: Thoren verbalizes understanding of the findings of todays visit and agrees with plan of treatment. I have discussed any further diagnostic evaluation that may be needed or ordered today. We also reviewed his medications today. he has been encouraged to call the office with any questions or concerns that should arise related to todays visit.   Time spent: 30 Minutes Time spent includes  review of chart, medications, test results and follow-up plan with the patient.  This patient was seen by Leeanne Deed AGNP-C in Collaboration with Dr Lyndon Code as a part of collaborative care agreement     Lubertha Basque. Florenda Watt AGNP-C Internal medicine

## 2020-05-07 ENCOUNTER — Encounter: Payer: Self-pay | Admitting: Hospice and Palliative Medicine

## 2020-05-18 DIAGNOSIS — Z20828 Contact with and (suspected) exposure to other viral communicable diseases: Secondary | ICD-10-CM | POA: Diagnosis not present

## 2020-05-26 DIAGNOSIS — Z03818 Encounter for observation for suspected exposure to other biological agents ruled out: Secondary | ICD-10-CM | POA: Diagnosis not present

## 2020-06-02 DIAGNOSIS — Z20828 Contact with and (suspected) exposure to other viral communicable diseases: Secondary | ICD-10-CM | POA: Diagnosis not present

## 2020-06-11 DIAGNOSIS — Z20828 Contact with and (suspected) exposure to other viral communicable diseases: Secondary | ICD-10-CM | POA: Diagnosis not present

## 2020-06-14 ENCOUNTER — Ambulatory Visit: Payer: Medicare Other | Admitting: Hospice and Palliative Medicine

## 2020-06-14 DIAGNOSIS — E119 Type 2 diabetes mellitus without complications: Secondary | ICD-10-CM | POA: Diagnosis not present

## 2020-06-14 DIAGNOSIS — F0391 Unspecified dementia with behavioral disturbance: Secondary | ICD-10-CM | POA: Diagnosis not present

## 2020-06-18 DIAGNOSIS — Z20828 Contact with and (suspected) exposure to other viral communicable diseases: Secondary | ICD-10-CM | POA: Diagnosis not present

## 2020-06-29 DIAGNOSIS — Z20828 Contact with and (suspected) exposure to other viral communicable diseases: Secondary | ICD-10-CM | POA: Diagnosis not present

## 2020-07-22 DIAGNOSIS — B351 Tinea unguium: Secondary | ICD-10-CM | POA: Diagnosis not present

## 2020-07-22 DIAGNOSIS — M79675 Pain in left toe(s): Secondary | ICD-10-CM | POA: Diagnosis not present

## 2020-07-22 DIAGNOSIS — M79674 Pain in right toe(s): Secondary | ICD-10-CM | POA: Diagnosis not present

## 2020-08-09 DIAGNOSIS — E119 Type 2 diabetes mellitus without complications: Secondary | ICD-10-CM | POA: Diagnosis not present

## 2020-08-09 DIAGNOSIS — F0391 Unspecified dementia with behavioral disturbance: Secondary | ICD-10-CM | POA: Diagnosis not present

## 2020-08-09 DIAGNOSIS — R4182 Altered mental status, unspecified: Secondary | ICD-10-CM | POA: Diagnosis not present

## 2020-08-09 DIAGNOSIS — Z87440 Personal history of urinary (tract) infections: Secondary | ICD-10-CM | POA: Diagnosis not present

## 2020-08-30 DIAGNOSIS — F0391 Unspecified dementia with behavioral disturbance: Secondary | ICD-10-CM | POA: Diagnosis not present

## 2020-08-30 DIAGNOSIS — E119 Type 2 diabetes mellitus without complications: Secondary | ICD-10-CM | POA: Diagnosis not present

## 2020-08-30 DIAGNOSIS — N390 Urinary tract infection, site not specified: Secondary | ICD-10-CM | POA: Diagnosis not present

## 2020-08-30 DIAGNOSIS — G301 Alzheimer's disease with late onset: Secondary | ICD-10-CM | POA: Diagnosis not present

## 2020-09-02 DIAGNOSIS — R4182 Altered mental status, unspecified: Secondary | ICD-10-CM | POA: Diagnosis not present

## 2020-09-06 DIAGNOSIS — F0391 Unspecified dementia with behavioral disturbance: Secondary | ICD-10-CM | POA: Diagnosis not present

## 2020-09-06 DIAGNOSIS — Z87440 Personal history of urinary (tract) infections: Secondary | ICD-10-CM | POA: Diagnosis not present

## 2020-09-06 DIAGNOSIS — I10 Essential (primary) hypertension: Secondary | ICD-10-CM | POA: Diagnosis not present

## 2020-09-15 DIAGNOSIS — Z131 Encounter for screening for diabetes mellitus: Secondary | ICD-10-CM | POA: Diagnosis not present

## 2020-09-15 DIAGNOSIS — E119 Type 2 diabetes mellitus without complications: Secondary | ICD-10-CM | POA: Diagnosis not present

## 2020-09-15 DIAGNOSIS — Z13228 Encounter for screening for other metabolic disorders: Secondary | ICD-10-CM | POA: Diagnosis not present

## 2020-10-04 DIAGNOSIS — F0391 Unspecified dementia with behavioral disturbance: Secondary | ICD-10-CM | POA: Diagnosis not present

## 2020-10-04 DIAGNOSIS — G301 Alzheimer's disease with late onset: Secondary | ICD-10-CM | POA: Diagnosis not present

## 2020-10-04 DIAGNOSIS — E119 Type 2 diabetes mellitus without complications: Secondary | ICD-10-CM | POA: Diagnosis not present

## 2020-10-13 DIAGNOSIS — B351 Tinea unguium: Secondary | ICD-10-CM | POA: Diagnosis not present

## 2020-10-13 DIAGNOSIS — M79674 Pain in right toe(s): Secondary | ICD-10-CM | POA: Diagnosis not present

## 2020-10-13 DIAGNOSIS — M79675 Pain in left toe(s): Secondary | ICD-10-CM | POA: Diagnosis not present

## 2021-01-04 DIAGNOSIS — M79674 Pain in right toe(s): Secondary | ICD-10-CM | POA: Diagnosis not present

## 2021-01-04 DIAGNOSIS — M79675 Pain in left toe(s): Secondary | ICD-10-CM | POA: Diagnosis not present

## 2021-01-04 DIAGNOSIS — B351 Tinea unguium: Secondary | ICD-10-CM | POA: Diagnosis not present

## 2021-03-09 DIAGNOSIS — E119 Type 2 diabetes mellitus without complications: Secondary | ICD-10-CM | POA: Diagnosis not present

## 2021-03-14 DIAGNOSIS — I10 Essential (primary) hypertension: Secondary | ICD-10-CM | POA: Diagnosis not present

## 2021-03-14 DIAGNOSIS — E119 Type 2 diabetes mellitus without complications: Secondary | ICD-10-CM | POA: Diagnosis not present

## 2021-03-14 DIAGNOSIS — F03918 Unspecified dementia, unspecified severity, with other behavioral disturbance: Secondary | ICD-10-CM | POA: Diagnosis not present

## 2021-03-14 DIAGNOSIS — G301 Alzheimer's disease with late onset: Secondary | ICD-10-CM | POA: Diagnosis not present

## 2021-03-24 DIAGNOSIS — B351 Tinea unguium: Secondary | ICD-10-CM | POA: Diagnosis not present

## 2021-03-24 DIAGNOSIS — M79675 Pain in left toe(s): Secondary | ICD-10-CM | POA: Diagnosis not present

## 2021-03-24 DIAGNOSIS — M79674 Pain in right toe(s): Secondary | ICD-10-CM | POA: Diagnosis not present
